# Patient Record
Sex: Female | Born: 1960
Health system: Southern US, Community
[De-identification: ages and names within clinical notes are randomized; demographics above are authoritative.]

## PROBLEM LIST (undated history)

## (undated) DIAGNOSIS — G4700 Insomnia, unspecified: Secondary | ICD-10-CM

## (undated) DIAGNOSIS — B009 Herpesviral infection, unspecified: Secondary | ICD-10-CM

## (undated) DIAGNOSIS — T7840XA Allergy, unspecified, initial encounter: Secondary | ICD-10-CM

## (undated) DIAGNOSIS — K589 Irritable bowel syndrome without diarrhea: Secondary | ICD-10-CM

## (undated) DIAGNOSIS — N390 Urinary tract infection, site not specified: Secondary | ICD-10-CM

## (undated) DIAGNOSIS — M1990 Unspecified osteoarthritis, unspecified site: Secondary | ICD-10-CM

## (undated) DIAGNOSIS — K219 Gastro-esophageal reflux disease without esophagitis: Secondary | ICD-10-CM

## (undated) DIAGNOSIS — I341 Nonrheumatic mitral (valve) prolapse: Secondary | ICD-10-CM

## (undated) DIAGNOSIS — J45909 Unspecified asthma, uncomplicated: Secondary | ICD-10-CM

## (undated) HISTORY — DX: Herpesviral infection, unspecified: B00.9

## (undated) HISTORY — PX: PELVIC LAPAROSCOPY: SHX162

## (undated) HISTORY — DX: Gastro-esophageal reflux disease without esophagitis: K21.9

## (undated) HISTORY — DX: Insomnia, unspecified: G47.00

## (undated) HISTORY — PX: URETHRAL STRICTURE DILATATION: SHX477

## (undated) HISTORY — DX: Unspecified osteoarthritis, unspecified site: M19.90

## (undated) HISTORY — DX: Unspecified asthma, uncomplicated: J45.909

## (undated) HISTORY — PX: NASAL SINUS SURGERY: SHX719

## (undated) HISTORY — DX: Irritable bowel syndrome, unspecified: K58.9

## (undated) HISTORY — PX: BLADDER REPAIR: SHX76

## (undated) HISTORY — DX: Nonrheumatic mitral (valve) prolapse: I34.1

## (undated) HISTORY — DX: Allergy, unspecified, initial encounter: T78.40XA

---
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
## 1998-12-18 ENCOUNTER — Encounter: Payer: Self-pay | Admitting: Family Medicine

## 1998-12-18 ENCOUNTER — Encounter: Admission: RE | Admit: 1998-12-18 | Discharge: 1998-12-18 | Payer: Self-pay | Admitting: Family Medicine

## 1998-12-19 ENCOUNTER — Ambulatory Visit (HOSPITAL_COMMUNITY): Admission: RE | Admit: 1998-12-19 | Discharge: 1998-12-19 | Payer: Self-pay | Admitting: Internal Medicine

## 1998-12-19 ENCOUNTER — Encounter: Payer: Self-pay | Admitting: Internal Medicine

## 1998-12-31 ENCOUNTER — Ambulatory Visit (HOSPITAL_COMMUNITY): Admission: RE | Admit: 1998-12-31 | Discharge: 1998-12-31 | Payer: Self-pay | Admitting: *Deleted

## 1999-04-12 ENCOUNTER — Other Ambulatory Visit: Admission: RE | Admit: 1999-04-12 | Discharge: 1999-04-12 | Payer: Self-pay | Admitting: Gynecology

## 2000-02-14 ENCOUNTER — Ambulatory Visit (HOSPITAL_COMMUNITY): Admission: RE | Admit: 2000-02-14 | Discharge: 2000-02-14 | Payer: Self-pay | Admitting: Gynecology

## 2000-10-21 ENCOUNTER — Other Ambulatory Visit: Admission: RE | Admit: 2000-10-21 | Discharge: 2000-10-21 | Payer: Self-pay | Admitting: Gynecology

## 2002-01-24 ENCOUNTER — Other Ambulatory Visit: Admission: RE | Admit: 2002-01-24 | Discharge: 2002-01-24 | Payer: Self-pay | Admitting: Gynecology

## 2002-06-07 ENCOUNTER — Ambulatory Visit (HOSPITAL_COMMUNITY): Admission: RE | Admit: 2002-06-07 | Discharge: 2002-06-07 | Payer: Self-pay | Admitting: Urology

## 2002-06-07 ENCOUNTER — Encounter: Payer: Self-pay | Admitting: Urology

## 2003-02-21 ENCOUNTER — Other Ambulatory Visit: Admission: RE | Admit: 2003-02-21 | Discharge: 2003-02-21 | Payer: Self-pay | Admitting: Gynecology

## 2003-03-31 ENCOUNTER — Ambulatory Visit (HOSPITAL_COMMUNITY): Admission: RE | Admit: 2003-03-31 | Discharge: 2003-03-31 | Payer: Self-pay | Admitting: Gynecology

## 2004-04-23 ENCOUNTER — Other Ambulatory Visit: Admission: RE | Admit: 2004-04-23 | Discharge: 2004-04-23 | Payer: Self-pay | Admitting: Gynecology

## 2005-06-09 ENCOUNTER — Other Ambulatory Visit: Admission: RE | Admit: 2005-06-09 | Discharge: 2005-06-09 | Payer: Self-pay | Admitting: Gynecology

## 2006-03-06 ENCOUNTER — Ambulatory Visit: Payer: Self-pay | Admitting: Family Medicine

## 2006-05-12 ENCOUNTER — Ambulatory Visit: Payer: Self-pay | Admitting: Vascular Surgery

## 2006-05-12 ENCOUNTER — Ambulatory Visit (HOSPITAL_COMMUNITY): Admission: RE | Admit: 2006-05-12 | Discharge: 2006-05-12 | Payer: Self-pay | Admitting: Family Medicine

## 2006-05-12 ENCOUNTER — Encounter (INDEPENDENT_AMBULATORY_CARE_PROVIDER_SITE_OTHER): Payer: Self-pay | Admitting: *Deleted

## 2006-09-04 ENCOUNTER — Other Ambulatory Visit: Admission: RE | Admit: 2006-09-04 | Discharge: 2006-09-04 | Payer: Self-pay | Admitting: Gynecology

## 2007-02-01 ENCOUNTER — Encounter: Admission: RE | Admit: 2007-02-01 | Discharge: 2007-02-01 | Payer: Self-pay | Admitting: Rheumatology

## 2009-04-03 ENCOUNTER — Other Ambulatory Visit: Admission: RE | Admit: 2009-04-03 | Discharge: 2009-04-03 | Payer: Self-pay | Admitting: Gynecology

## 2009-04-03 ENCOUNTER — Ambulatory Visit: Payer: Self-pay | Admitting: Gynecology

## 2009-09-14 ENCOUNTER — Emergency Department (HOSPITAL_BASED_OUTPATIENT_CLINIC_OR_DEPARTMENT_OTHER): Admission: EM | Admit: 2009-09-14 | Discharge: 2009-09-14 | Payer: Self-pay | Admitting: Emergency Medicine

## 2009-09-20 ENCOUNTER — Encounter: Admission: RE | Admit: 2009-09-20 | Discharge: 2009-09-20 | Payer: Self-pay | Admitting: Internal Medicine

## 2010-02-18 ENCOUNTER — Encounter: Payer: Self-pay | Admitting: Urology

## 2010-03-06 ENCOUNTER — Emergency Department (HOSPITAL_COMMUNITY)
Admission: EM | Admit: 2010-03-06 | Discharge: 2010-03-06 | Disposition: A | Discharge status: Home / Self Care | Payer: Self-pay | Attending: Emergency Medicine | Admitting: Emergency Medicine

## 2010-03-06 ENCOUNTER — Emergency Department (HOSPITAL_COMMUNITY): Payer: Self-pay

## 2010-03-06 DIAGNOSIS — K5641 Fecal impaction: Secondary | ICD-10-CM | POA: Insufficient documentation

## 2010-03-06 DIAGNOSIS — K6289 Other specified diseases of anus and rectum: Secondary | ICD-10-CM | POA: Insufficient documentation

## 2010-03-08 ENCOUNTER — Emergency Department (HOSPITAL_BASED_OUTPATIENT_CLINIC_OR_DEPARTMENT_OTHER)
Admission: EM | Admit: 2010-03-08 | Discharge: 2010-03-08 | Disposition: A | Discharge status: Home / Self Care | Payer: Self-pay | Attending: Emergency Medicine | Admitting: Emergency Medicine

## 2010-03-08 DIAGNOSIS — K589 Irritable bowel syndrome without diarrhea: Secondary | ICD-10-CM | POA: Insufficient documentation

## 2010-03-08 DIAGNOSIS — E860 Dehydration: Secondary | ICD-10-CM | POA: Insufficient documentation

## 2010-03-08 DIAGNOSIS — R197 Diarrhea, unspecified: Secondary | ICD-10-CM | POA: Insufficient documentation

## 2010-04-12 LAB — CBC
MCH: 31 pg (ref 26.0–34.0)
MCV: 89 fL (ref 78.0–100.0)
Platelets: 212 10*3/uL (ref 150–400)
RDW: 11.9 % (ref 11.5–15.5)

## 2010-04-12 LAB — BASIC METABOLIC PANEL
BUN: 12 mg/dL (ref 6–23)
CO2: 25 mEq/L (ref 19–32)
Chloride: 108 mEq/L (ref 96–112)
Creatinine, Ser: 1 mg/dL (ref 0.4–1.2)

## 2010-04-12 LAB — URINALYSIS, ROUTINE W REFLEX MICROSCOPIC
Bilirubin Urine: NEGATIVE
Ketones, ur: NEGATIVE mg/dL
Nitrite: NEGATIVE
pH: 6 (ref 5.0–8.0)

## 2010-04-12 LAB — PREGNANCY, URINE: Preg Test, Ur: NEGATIVE

## 2010-04-12 LAB — URINE MICROSCOPIC-ADD ON

## 2010-04-12 LAB — DIFFERENTIAL
Eosinophils Absolute: 0.1 10*3/uL (ref 0.0–0.7)
Eosinophils Relative: 2 % (ref 0–5)
Lymphs Abs: 2.9 10*3/uL (ref 0.7–4.0)

## 2010-06-14 NOTE — Assessment & Plan Note (Signed)
[name] is a 50 year old white female who was seen in our pain  and rehabilitation clinic for multiple pain complaints, including  cervicalgia, low back pain, bilateral upper extremity pain and right  lower extremity pain.   She has a history of degenerative spinal disease, including her cervical  spine, which is status post C5-6 and C6-7 fusion.  She also has mild  lumbar stenosis and lumbar spondylosis, with a long history of  fibromyalgia overlay.   She is back in today and states she has been more active over the last  several weeks.  Has been engaged in some yard activity and subsequently  has developed some increased low back pain over about the last four to  five weeks.   She states her average pain is about a 9 on the scale of 10.  She is  getting very little relief with the current meds that she is taking.  Pain is described as constant and sharp and aching.   She is able to walk about 10 minutes at a time.  She is able to climb  stairs and drive.  She is independent with all of her self care and some  high level household duties.   She denies problems controlling bowel or bladder.  Denies suicidal  ideation.  Admits to some intermittent numbness and tingling.   REVIEW OF SYSTEMS:  Positive for diarrhea, constipation, abdominal pain.  Has a history of irritable bowel syndrome.   PAST MEDICAL HISTORY:  Remarkable for recent application of braces to  both upper and lower teeth.  She has been miserable since these have  been placed.  Her teeth are achy.  She reports that she has not been  eating well.   She has no other changes in her social or family history.   MEDICATIONS:  Medications prescribed by this clinic include the  following:  1. Ibuprofen 600 mg up to three times a day on a p.r.n. basis.  2. She also takes Nexium 40 mg p.o. q.d.  3. Amitriptyline 10 mg one to two p.o. at night.   PHYSICAL EXAMINATION:  GENERAL:  She is sunburned.  Her face is  quite  red.  It is not peeling or blistered, however.  She is a well-developed,  well-nourished female who does not appear in any distress.  She is  oriented times three.  Her affect is bright, alert, cooperative and  pleasant.  She follows demands without difficulty.  VITAL SIGNS:  Blood pressure 105/51, pulse 84, respirations 16, 100%  saturated on room air.  MUSCULOSKELETAL:  She transitions from sit to stand easily.  Gait in the  room is stable.  Tandem gait, Romberg test is performed adequately.  She  has limitations of lumbar motion in all planes.   Reflexes are symmetric and intact in the lower extremities.  Motor  strength is good in both lower extremities.  She has quite a bit of  tenderness even with light palpation throughout the lumbar paraspinal  musculature.  Both trochanters are quite tender down the iliotibial band  bilaterally.   IMPRESSION:  1. Early central lateral lumbar stenosis.  2. Bilateral trochanteric bursitis.  3. Cervicogenic headache.  4. Cervicalgia status post C5-6 and C6-7 fusion by Dr. Lucita Ferrara.   Cervical radiofrequency was done last March 11, 2005.   PLAN:  She brings up trialing Lyrica again.  Apparently she has a friend  who has done well on it; however, while she was on it we  had some  concerns about easy bleeding.  PT and PTT were tested, however,  apparently the sample was not kept frozen and the test results may have  been compromised.   Will refill her medications today, ibuprofen 600 mg up to twice a day  number 60 for the month, and amitriptyline 10 mg 1 to 2 p.o. q.h.s.  number 60, no refills.  Would like to try her on Ultracet as well 1 to 2  p.o. b.i.d. number 120 and consider trochanteric injection again at the  next visit.  We will see how she is doing.  I have asked her to continue  her exercise program again.  She has been to therapy a little over a  year ago for her trochanteric bursitis of the iliotibial band syndrome;   however, she has not been following through with the program.  Will see  her back in a month.           ______________________________  Brantley Stage, M.D.     DMK/MedQ  D:  05/28/2006 13:31:43  T:  05/28/2006 14:18:55  Job #:  [id number]

## 2010-06-14 NOTE — H&P (Signed)
[facility]  Patient:    Tracy Larsen, Tracy Larsen                        MRN: [id number] Attending:  Douglass Rivers, M.D.                         History and Physical  CHIEF COMPLAINT:              History of endometriosis with left lower quadrant pain and endocervical polyps.  HISTORY OF PRESENT ILLNESS:   The patient is a 50 year old G1, P1 with a history of endometriosis -- she has had four laparoscopies in the past with fulguration of endometriosis -- who has indolent right lower quadrant pain that has increased over the past number of years.  She is desiring one more pregnancy and would like evaluation of the left lower quadrant pain.  Of note, she has a history of kidney stones and irritable bowel as well but this has been more persistent and different than the previous.  She also has a history of endocervical polyps, with postcoital bleeding as well as spotting on birth control pills.  She has had intra-office endocervical polypectomy in 1998 and had initially done very well, but the polyps have returned and she continues to have symptoms at this point and would like more definitive treatment of them.  PAST OBSTETRICAL/GYNECOLOGICAL HISTORY:  Significant for endometriosis, as above, regular menses, contracepting with birth control pills, normal Pap smears, history of HSV, for which she takes Zovirax as needed.  PAST MEDICAL HISTORY:         Significant for kidney stones and urethral stenosis for which she has had dilation, last one in 1979.  PAST SURGICAL HISTORY:        Significant for urethral dilation in 1979, laparoscopies x 4 and nose reconstruction in 1992.  MEDICATIONS:                  Birth control pills and Zovirax p.r.n.  ALLERGIES:                    TETRACYCLINE, SULFA, QUESTIONABLY IODINE.  PHYSICAL EXAMINATION  GENERAL:                      She is well-appearing, in no acute distress.  HEENT:                         Unremarkable.  NECK:                         Thyroid is smooth, nontender and mobile.  HEART:                        Regular rate.  LUNGS:                        Clear to auscultation.  BREASTS:                      Without mass, discharge or retractions in supine and upright position.  ABDOMEN:                      Soft and nontender.  PELVIC:  On GYN exam, normal external female genitalia. BUS negative.  The vagina is pink and moist.  Cervix:  There is a clustering of endocervical polyps that is visualized.  There is no cervical motion tenderness.  There is some tenderness in the right lower quadrant; the left is negative.  There is no fullness.  Rectovaginal exam is confirmatory.  ULTRASOUND:                   Ultrasound, January 29, 2000, was normal without any ovarian cysts in either adnexa.  ASSESSMENT:                   1. Right lower quadrant pain with a history of                                  endometriosis, status post fulguration x 4,                                  for diagnostic laparoscopy, possible                                  fulguration of endometriosis or lysis of                                  adhesions.                               2. Endocervical polyps with postcoital bleeding.                                  We will do a hysteroscopy in an attempt to                                  resect the polyps at their base, as we were                                  unsuccessful in the past.                                The risks and benefits of both procedures were both discussed with the patient.  Because of her previous laparoscopies in the past, we will do an open laparoscopy and she is agreeable.  All questions were addressed and she will present on January 18th for the above procedures. DD:  02/14/00 TD:  02/14/00 Job: [id number]

## 2010-06-14 NOTE — Op Note (Signed)
[facility]  Patient:    Tracy Larsen, Tracy Larsen                      MRN: [id number] Proc. Date: 02/14/00 Adm. Date:  [id number] Disc. Date: [id number] Attending:  Douglass Rivers                           Operative Report  PREOPERATIVE DIAGNOSES:       1. Pelvic pain.                               2. History of endometriosis.                               3. Endocervical polyps.                               4. Postcoital bleeding.  POSTOPERATIVE DIAGNOSES:      1. Pelvic pain.                               2. History of endometriosis.                               3. Endocervical polyps.                               4. Postcoital bleeding.                               5. Endometriosis.  PROCEDURE:                    1. Diagnostic hysteroscopy with polypectomy.                               2. Open laparoscopy.                               3. Fulguration of endometriosis with YAG.  SURGEON:                      Douglass Rivers, M.D.  ANESTHESIA:                   General.  ESTIMATED BLOOD LOSS:         Minimal.  IN AND OUT DEFICITS:          40 cc of a 3% Sorbitol solution.  FINDINGS:                     Normal uterine cavity contours. Superficial endocervical polyps. There is endometriosis studding throughout the anterior and posterior cul-de-sac. Normal tubes, ovaries, liver, gallbladder, and appendix.  COMPLICATIONS:                None.  PATHOLOGY:                    Endocervical polyps.  DESCRIPTION OF PROCEDURE:  Patient was taken to the operating room, general anesthesia was induced, and prepped and draped in usual sterile fashion. Laminaria placed the night before was removed. A sterile weighted speculum was placed in the vagina. The cervix was visualized and stabilized with a single-tooth tenaculum. Ten cc of a 2% Pitressin solution was injected into the cervix for hemostasis. The cervix was then gently dilated up to 31 [place] and  the operative hysteroscope was advanced through the cervix. The uterus was  unremarkable. The contour were normal. The ostia were visualized and grossly normal. The hysteroscope was slowly removed from the cavity and only at the very edge of the cervix were the origins of the polyps able to be visualized. Unfortunately, because this was at the very edge of the cervix, we were unable to adequately dilate the cervix to use the hysteroscope. The hysteroscope was then removed from the vagina. A gentle curettage of the cervix was performed as well as using polyp forceps and three rather large polyps were removed. There was then a gentle scraping of the remainder of the canal which was essentially unremarkable. An acorn was then placed in the cervix and attention was then turned to the abdomen. As the patient had had four prior laparoscopes, an infraumbilical incision was made with the scalpel and carried through sharply until the underlying fascia was visualized. The fascia was elevated and sharply entered. The peritoneum was identified and entered bluntly with a Tresa Endo. Upon entering the cavity, we were noted to be free of bowel adhesions. The incision was then extended and a #10 trocar was inserted through the port. The open laparoscopy fascial defect was slightly larger than the laparoscope port and the skin was reapproximated in order to get a better seal with the Allis clamp. A pneumoperitoneum was created. The findings were as above. A #5 port was made in the midline through previous incision through which a blunt probe was used to move the bowel out of the pelvis. Suction irrigator was the presented through this port. A YAG laser was then inserted through the operative scope with KPR6 sapphire tip with 10 watts. The anterior cul-de-sac endometriosis was treated in a sweeping fashion. It was noted to be hemostatic. All areas of endometriosis in the anterior cul-de-sac were treated and then  irrigated, noted to be hemostatic. Attention was then turned to the posterior cul-de-sac. The bowel was moved away and the posterior cul-de-sac was treated with the laser in a spot and sweeping fashion. Most of the disease was on the uterosacral ligaments bilaterally and between them. There was also a small amount in the cul-de-sac inferior to the uterosacrals and this was also treated. There was a small amount of endometriosis that was over the ureter on the left. This was left; however, endometriosis anteriorly and inferiorly to this was superficially treated. The area underneath both ovaries was noted to be free of disease as was both ovaries. The tubes were noted to be lush. There was a small amount of endometriosis on the posterior uterus. Peristalsis was appreciated on the right ureter but not the left. The appendix was visualized and noted to be hemostatic. The pelvis was irrigated with copious amount of saline. The laser was passed off the table. The pneumoperitoneum was released and the ports were removed under direct visualization. The fascia and peritoneum of the infraumbilical port was closed with a figure-of-eight of 0 Vicryl. The skin was closed with 3-0 plain. The areas of the Niederwald clamps as well as the  infraumbilical port was injected with 0.25% Marcaine solution. The suprapubic area, as we had gone through a thick keloid scar, was reapproximated with 3-0 plain and again was also injected with 0.25% Marcaine. The instruments were then removed from the vagina. The vagina was inspected. The cervix was noted to be hemostatic. The patient tolerated the procedure well. Sponge, lap, and needle counts correct x 2. She was transferred to the recovery room in stable condition. DD:  02/14/00 TD:  02/15/00 Job: [id number]

## 2010-07-23 ENCOUNTER — Encounter (INDEPENDENT_AMBULATORY_CARE_PROVIDER_SITE_OTHER): Payer: BC Managed Care – PPO | Admitting: Gynecology

## 2010-07-23 ENCOUNTER — Other Ambulatory Visit: Payer: Self-pay | Admitting: Gynecology

## 2010-07-23 ENCOUNTER — Other Ambulatory Visit (HOSPITAL_COMMUNITY)
Admission: RE | Admit: 2010-07-23 | Discharge: 2010-07-23 | Disposition: A | Discharge status: Home / Self Care | Payer: BC Managed Care – PPO | Source: Ambulatory Visit | Attending: Gynecology | Admitting: Gynecology

## 2010-07-23 DIAGNOSIS — Z01419 Encounter for gynecological examination (general) (routine) without abnormal findings: Secondary | ICD-10-CM

## 2010-07-23 DIAGNOSIS — Z124 Encounter for screening for malignant neoplasm of cervix: Secondary | ICD-10-CM | POA: Insufficient documentation

## 2010-08-19 ENCOUNTER — Telehealth: Payer: Self-pay | Admitting: *Deleted

## 2010-08-19 NOTE — Telephone Encounter (Signed)
PT LM ABOUT QUESTIONS RE: BCP AND PAP RESULT FOR OV 07/23/10.CALLED PT BACK ABOUT QUESTION REGARDING BCP. LM ON PT V.M PER PT REQUEST WITH ANSWER TO QUESTIONS.

## 2010-08-27 ENCOUNTER — Telehealth: Payer: Self-pay | Admitting: *Deleted

## 2010-08-27 MED ORDER — NORETHINDRONE ACET-ETHINYL EST 1-20 MG-MCG PO TABS: 1.0000 | ORAL_TABLET | Freq: Every day | ORAL | Status: DC

## 2010-08-27 NOTE — Telephone Encounter (Signed)
FYI PT WANTED YOU TO KNOW SHE IS DOING WELL ON LO LOESTRIN BCP AND HAS COMPLETED HER SAMPLES. SHE WOULD LIKE RX FOR BCP.

## 2010-08-27 NOTE — Telephone Encounter (Signed)
PT INFORMED WITH THE BELOW NOTE. 

## 2010-08-27 NOTE — Telephone Encounter (Signed)
If she is doing well on the 120 equivalent dose then we can go and refill her until June 2013. Assuming she continues well this was down she has any issues then office visit.  Her FSH was elevated consistent with menopause at some point we'll need to stop the pills and discuss whether we want to switch over to a lower dose HRT regimen but I think at this point since she is doing well on these we'll keep her on this she is comfortable with this.

## 2010-09-04 ENCOUNTER — Telehealth: Payer: Self-pay | Admitting: *Deleted

## 2010-09-04 NOTE — Telephone Encounter (Signed)
PT CALLED STATING THAT SHE PICKED UP HER BCP RX FOR LOESTRIN 1/20 AND ONLY HAD THREE ROWS OF ACTIVE PILLS RATHER THAN 4 ROWS OF THE SAMPLES SHE WAS GIVEN IN THE OFFICE WHICH WERE LO LOESTRIN FE. PT IS VERY CONCERNED THAT SHE WON'T RECEIVE THE SAME AMOUNT OF HORMONE THAT SHE GOT IN HER SAMPLE PACK. PT IS AWARE THAT SHE WAS GIVEN GENERIC BRAND. PLEASE ADVISE.

## 2010-09-04 NOTE — Telephone Encounter (Signed)
Lo Loestrin has a little bit lower estrogen dose then Loestrin 1/20.  I prescribed the Loestrin 120 because it's generic and usually cheaper. From a contraceptive standpoint I think both are okay. The Loestrin 1/20 has 3 weeks of active pills and a pill free week. The lo Loestrin extents the hormones further into the fourth week. Theoretically the low Loestrin have a lower thrombosis blood clot risk, but I am not sure at these lower levels whether that would be clinically significant in her case.  Options would be to call in the lower Loestrin if you feel more comfortable with continuing the same or she can try the Loestrin 120 see how she does the first month and then go from there. If he does well with it stay on it if not then call us and we can switch her back.

## 2010-09-04 NOTE — Telephone Encounter (Signed)
PT INFORMED WITH THE BELOW NOTE. 

## 2010-10-11 ENCOUNTER — Telehealth: Payer: Self-pay | Admitting: *Deleted

## 2010-10-11 NOTE — Telephone Encounter (Signed)
Pt called c/o rash under breasts: red, raised and itching. Requests some yeast/hydrocortisone cream to be called in. Ok per [place] to send to her pharmacy, Rx called in with one refill.

## 2010-11-20 ENCOUNTER — Telehealth: Payer: Self-pay | Admitting: *Deleted

## 2010-11-20 MED ORDER — NORGESTREL-ETHINYL ESTRADIOL 0.3-30 MG-MCG PO TABS: 1.0000 | ORAL_TABLET | Freq: Every day | ORAL | Status: DC

## 2010-11-20 NOTE — Telephone Encounter (Signed)
Pt called complaining of increased weight gain for her loestrin 1/20 birth control. She says that she feels very bloating all the time and feels miserable. She wants to know if she could switch to another pill? Please advise the above.

## 2010-11-20 NOTE — Telephone Encounter (Signed)
Left message on pt voicemail with the below note 

## 2010-11-20 NOTE — Telephone Encounter (Signed)
We can try a LoOvral equivalent.

## 2011-03-11 ENCOUNTER — Encounter: Payer: Self-pay | Admitting: Gynecology

## 2011-03-11 ENCOUNTER — Ambulatory Visit (INDEPENDENT_AMBULATORY_CARE_PROVIDER_SITE_OTHER): Payer: 59 | Admitting: Gynecology

## 2011-03-11 DIAGNOSIS — R21 Rash and other nonspecific skin eruption: Secondary | ICD-10-CM

## 2011-03-11 MED ORDER — FLUCONAZOLE 200 MG PO TABS: 200.0000 mg | ORAL_TABLET | Freq: Every day | ORAL | Status: AC

## 2011-03-11 MED ORDER — ACYCLOVIR 400 MG PO TABS: 400.0000 mg | ORAL_TABLET | Freq: Two times a day (BID) | ORAL | Status: DC

## 2011-03-11 MED ORDER — HYDROXYZINE HCL 25 MG PO TABS: 25.0000 mg | ORAL_TABLET | Freq: Three times a day (TID) | ORAL | Status: AC | PRN

## 2011-03-11 NOTE — Patient Instructions (Signed)
Take medications as prescribed. Follow up with dermatology. Follow up with your decision as far as oral contraceptives or hormone replacement therapy.

## 2011-03-11 NOTE — Progress Notes (Signed)
Patient presents with a several week history of chest and back rash. She had the initial onset of hives which substantively followed with a red papular type rash. She was seen in urgent care was given a steroid dose pack which she has 2 days left and steroid cream is getting better. She has an appointment to see a dermatologist next month which was the soonest she could be seen. She had been on oral contraceptives but stopped them 2 weeks ago at the onset of the rash thinking maybe it was related although she has been on these birth control pills for a number of months. We evaluated her in June complaining of menopausal type symptoms and was found to have an elevated FSH at 65. She elected to continue on the oral contraceptives but notes she was getting hot flashes during the pill free week.  Patient also notes a longer history of a rash underneath her breasts and and in between her breasts and had been putting on nystatin cream on this.  Exam with papular punctated rash over her chest and lower abdomen. Some erythema between her breasts and underneath her breast although not classic for yeast.  Assessment and plan: Hives followed by rash. Seems to be getting better. I added Vistaril 25 mg at bedtime to help with her itching #30. I also provided Diflucan 200 mg #7 to be taken daily in the event that the rash between her breasts and underneath is related to Candida. She just started her period today having a lot of hot flashes and issues as to where he goes for his as concern was discussed. She could restart her oral contraceptives and supplement estrogen her pill free week as she does have a lot of hot flashes than. I reviewed the risks of birth control pills and a prior smoker at her age and the risk of stroke heart attack DVT reviewed. Alternatives would be to start HRT which I think would ask her to be a more appropriate choice than she would need to use backup contraception at this point until the picture is  clear that she is amenorrheic. She is very reluctant on these contraception as she has had multiple allergic type reactions when she tried the NuvaRing condoms and spermicides. The issue of overkill with the oral contraceptives given her elevated FSH in symptomatology versus not being truly through menopause and the risk of pregnancy again were discussed. I reviewed the risks of HRT to include the WHI increased risk of stroke heart attack DVT and breast cancer. The patient wants to think of her options and she will call back if she wants to restart low-dose pills accepted the risks or HRT.  Nonhormonal options such as Effexor were also possible although would not provide contraception and that would also be an issue until again the picture is clear as far as menstruation. I do not think repeating FSH at this point is of any benefit given her elevated 66 in June 2012 and her current symptomatology.

## 2011-03-13 ENCOUNTER — Telehealth: Payer: Self-pay | Admitting: *Deleted

## 2011-03-13 MED ORDER — ESTRADIOL-NORETHINDRONE ACET 0.5-0.1 MG PO TABS: 1.0000 | ORAL_TABLET | Freq: Every day | ORAL | Status: DC

## 2011-03-13 NOTE — Telephone Encounter (Signed)
She can start right into the HRT and I am going to put her on Activella which she will take every day. I want her to keep a bleeding calendar and let me know if she has any periods after doing this and reminded her to use backup contraception for now.  Call me in several must limit know how she's doing.

## 2011-03-13 NOTE — Telephone Encounter (Signed)
Informed.

## 2011-03-13 NOTE — Telephone Encounter (Signed)
Pt wants to go off OCP and go onto HRT. Can she just start taking the HRT when she gets it or does she need to start at a particular time? pls advise

## 2011-04-09 ENCOUNTER — Encounter: Payer: Self-pay | Admitting: Family Medicine

## 2011-04-09 ENCOUNTER — Ambulatory Visit (INDEPENDENT_AMBULATORY_CARE_PROVIDER_SITE_OTHER): Payer: 59 | Admitting: Family Medicine

## 2011-04-09 VITALS — BP 110/78 | HR 83 | Temp 98.0°F | Resp 16 | Ht 67.0 in | Wt 197.4 lb

## 2011-04-09 DIAGNOSIS — R21 Rash and other nonspecific skin eruption: Secondary | ICD-10-CM

## 2011-04-09 DIAGNOSIS — R5383 Other fatigue: Secondary | ICD-10-CM

## 2011-04-09 DIAGNOSIS — J029 Acute pharyngitis, unspecified: Secondary | ICD-10-CM

## 2011-04-09 DIAGNOSIS — R5381 Other malaise: Secondary | ICD-10-CM

## 2011-04-09 LAB — POCT CBC
HCT, POC: 44.3 % (ref 37.7–47.9)
Lymph, poc: 2.2 (ref 0.6–3.4)
MCH, POC: 28.7 pg (ref 27–31.2)
MCHC: 32.5 g/dL (ref 31.8–35.4)
MCV: 88.3 fL (ref 80–97)
POC Granulocyte: 4.1 (ref 2–6.9)
POC LYMPH PERCENT: 31.6 %L (ref 10–50)
RDW, POC: 12.9 %
WBC: 7 10*3/uL (ref 4.6–10.2)

## 2011-04-09 MED ORDER — METHYLPREDNISOLONE 4 MG PO KIT: PACK | ORAL | Status: AC

## 2011-04-09 NOTE — Progress Notes (Signed)
Patient Name: Tracy Larsen Date of Birth: 1960-08-12 Medical Record Number: [id number] Gender: female Date of Encounter: 04/09/2011  History of Present Illness:  Tracy Larsen is a 51 y.o. very pleasant female patient who presents with the following:  Felt nauseated on Saturday- today is Wednesday.  Then she developed a severe ST and headaches which caused her to need to lie down.  HA is better, ST is a little better now.  Yesterday she developed a rash across her face- started by her left ear and has spread to both sides of her forehead.  Is very itchy but not painful.  She has no eye or visual symptoms, does not wear contacts  About one month ago had an episode of hives which responded well to a medrol dosepack.  She is not sure the cause of her hives.  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   There is no problem list on file for this patient.  Past Medical History  Diagnosis Date  . Herpes   . Endometriosis   . Mitral valve prolapse   . IBS (irritable bowel syndrome)   . GERD (gastroesophageal reflux disease)   . IBS (irritable bowel syndrome)   . HSV (herpes simplex virus) infection   . Insomnia   . Endometriosis    Past Surgical History  Procedure Date  . Bladder repair     AGE 33  . Pelvic laparoscopy     X 5  . Urethral stricture dilatation    History  Substance Use Topics  . Smoking status: Former Games developer  . Smokeless tobacco: Not on file  . Alcohol Use: Yes   Family History  Problem Relation Age of Onset  . Cancer Mother     VULVAR CANCER  . Heart disease Father   . Cancer Brother     SARCOMA  . Heart disease Brother     ALSO GRANDFATHER W CA NOT SPECIFIED IF PAT OR MAT  . Diabetes Paternal Grandmother    Allergies  Allergen  Reactions  . Avelox (Moxifloxacin Hcl In Nacl)   . Entex   . Erythromycin   . Levaquin   . Sulfa Antibiotics   . Tetracyclines & Related     Medication list has been reviewed and updated.  Review of Systems: As per HPI- otherwise negative.   Physical Examination: Filed Vitals:   04/09/11 1359  BP: 110/78  Pulse: 83  Temp: 98 F (36.7 C)  TempSrc: Oral  Resp: 16  Height: 5\' 7"  (1.702 m)  Weight: 197 lb 6.4 oz (89.54 kg)    Body mass index is 30.92 kg/(m^2).  GEN: WDWN, NAD, Non-toxic, A & O x 3, overweight HEENT: Atraumatic, Normocephalic. Neck supple. No masses, No LAD.  TM and oropharynx wnl.  Eyes normal, PEERL, EOMI, conjunctivae clear Face: there is a discrete papular rash which is most intense and also scaly anterior to her right ear.  It continues in a more diffuse manner across her left forehead and onto her right forehead.  There is no other rash present Ears and Nose: No external deformity. CV: RRR, No M/G/R. No JVD. No thrill. No extra heart sounds. PULM: CTA B, no wheezes, crackles, rhonchi. No retractions. No resp. distress.  No accessory muscle use. EXTR: No c/c/e NEURO Normal gait.  PSYCH: Normally interactive. Conversant. Not depressed or anxious appearing.  Calm demeanor.   Results for orders placed in visit on 04/09/11  POCT CBC      Component Value Range   WBC 7.0  4.6 - 10.2 (K/uL)   Lymph, poc 2.2  0.6 - 3.4    POC LYMPH PERCENT 31.6  10 - 50 (%L)   MID (cbc) 0.7  0 - 0.9    POC MID % 9.8  0 - 12 (%M)   POC Granulocyte 4.1  2 - 6.9    Granulocyte percent 58.6  37 - 80 (%G)   RBC 5.02  4.04 - 5.48 (M/uL)   Hemoglobin 14.4  12.2 - 16.2 (g/dL)   HCT, POC 16.1  09.6 - 47.9 (%)   MCV 88.3  80 - 97 (fL)   MCH, POC 28.7  27 - 31.2 (pg)   MCHC 32.5  31.8 - 35.4 (g/dL)   RDW, POC 04.5     Platelet Count, POC 286  142 - 424 (K/uL)   MPV 8.1  0 - 99.8 (fL)  POCT INFLUENZA A/B      Component Value Range   Influenza A, POC Negative     Influenza B,  POC Negative    POCT RAPID STREP A (OFFICE)      Component Value Range   Rapid Strep A Screen Negative  Negative      Assessment and Plan: 1. Sore throat  POCT rapid strep A, Throat culture ([facility])  2. Fatigue  POCT Influenza A/B  3. Rash  POCT CBC, methylPREDNISolone (MEDROL DOSEPAK) 4 MG tablet   Tamana likely has a viral illness causing her URI symptoms and cough.  The rash may be a part of this illness or could be a contact dermatitis of some type.  She has tried OTC cortisone to no effect, and I do not want to use stronger steroids topically if possible.  She is very concerned about the appearance of her rash and the itching is very bothersome she wishes to try a steroid pack as above; this seems reasonable as the rash is very pruritic.  However I cautioned her that if the steroids make the rash worse, stop using them and call us right away.  Patient (or parent if minor) instructed to return to clinic or call if not better in 2-3 day(s). Sooner if worse.

## 2011-04-11 LAB — CULTURE, GROUP A STREP: Organism ID, Bacteria: NORMAL

## 2011-04-12 ENCOUNTER — Encounter: Payer: Self-pay | Admitting: Family Medicine

## 2011-04-17 ENCOUNTER — Other Ambulatory Visit: Payer: Self-pay

## 2011-08-28 LAB — TSH: TSH: 1.73 u[IU]/mL (ref 0.41–5.90)

## 2011-08-28 LAB — CBC AND DIFFERENTIAL: WBC: 5.8 10^3/mL

## 2011-10-15 ENCOUNTER — Ambulatory Visit (INDEPENDENT_AMBULATORY_CARE_PROVIDER_SITE_OTHER): Payer: 59 | Admitting: Family Medicine

## 2011-10-15 ENCOUNTER — Encounter: Payer: Self-pay | Admitting: Family Medicine

## 2011-10-15 VITALS — BP 115/68 | HR 55 | Temp 98.2°F | Resp 18 | Ht <= 58 in | Wt 191.0 lb

## 2011-10-15 DIAGNOSIS — N920 Excessive and frequent menstruation with regular cycle: Secondary | ICD-10-CM

## 2011-10-15 LAB — POCT CBC
HCT, POC: 44.6 % (ref 37.7–47.9)
Lymph, poc: 2.1 (ref 0.6–3.4)
MCH, POC: 29.5 pg (ref 27–31.2)
MCV: 93.3 fL (ref 80–97)
MID (cbc): 0.4 (ref 0–0.9)
POC LYMPH PERCENT: 34.4 %L (ref 10–50)
Platelet Count, POC: 280 10*3/uL (ref 142–424)
RDW, POC: 13.6 %
WBC: 6.1 10*3/uL (ref 4.6–10.2)

## 2011-10-15 MED ORDER — LEVONORGESTREL-ETHINYL ESTRAD 0.1-20 MG-MCG PO TABS: 1.0000 | ORAL_TABLET | Freq: Every day | ORAL | Status: DC

## 2011-10-15 NOTE — Progress Notes (Signed)
[facility] [address] [place] [address] [phone]  Date:  10/15/2011   Name:  Tracy Larsen   DOB:  Apr 08, 1960   MRN:  [id number]  PCP:  Tonye Pearson, MD    Chief Complaint: Menorrhagia   History of Present Illness:  Tracy Larsen is a 51 y.o. very pleasant female patient who presents with the following:  Here today to evaluate heavy periods.  She stopped her OCP in January of this year due to concerns about weight gain and her age.  She then developed some hot flashes and saw her OB Dr. Audie Box in February. She had labs which indicated that she was peri- menopausal.   She started some HRT in February but was concerned about possible adverse effects and stopped after just a couple of weeks.    She did not bleed at all after stopping the HRT for a bout 2 months.  However, she then started to have heavy menses which are occuring twice a month.  She has been passing huge blood clots, and feels pressure in her pelvis/uterus.  She notes bright red and black blood.  For the last couple of days she has felt raw and sore in her vagina.  This could be due to frequent tampon changes.  There are no other vaginal symptoms such as discharge and no urinary symptoms.   She plans to establish care with a new OB- [facility] with DR. Horvath.  Her daughter will be delivering her first grandchild with Dr. Henderson Cloud this spring- she met Dr. Henderson Cloud with her daughter and wanted to start seeing her.    She has a history of endometriosis, which has been treated in the past with a laser procedure and which has been pretty quiet as of late.      She has a history of uterine fibroids and has had some ?removed in her OB office in the past, and she also had some treated during a pelvic laparoscopy for endometriosis in the year 2000.   She is employed at [employer]- they recently checked a TSH for her, and her Hg last month was in the 13s. Her Vit D was slightly  low ans she has started an OTC supplement  There is no problem list on file for this patient.   Past Medical History  Diagnosis Date  . Herpes   . Endometriosis   . Mitral valve prolapse   . IBS (irritable bowel syndrome)   . GERD (gastroesophageal reflux disease)   . IBS (irritable bowel syndrome)   . HSV (herpes simplex virus) infection   . Insomnia   . Endometriosis     Past Surgical History  Procedure Date  . Bladder repair     AGE 71  . Pelvic laparoscopy     X 5  . Urethral stricture dilatation     History  Substance Use Topics  . Smoking status: Former Games developer  . Smokeless tobacco: Not on file  . Alcohol Use: Yes    Family History  Problem Relation Age of Onset  . Cancer Mother     VULVAR CANCER  . Heart disease Father   . Cancer Brother     SARCOMA  . Heart disease Brother     ALSO GRANDFATHER W CA NOT SPECIFIED IF PAT OR MAT  . Diabetes Paternal Grandmother     Allergies  Allergen Reactions  . Avelox (Moxifloxacin Hcl In Nacl)   . Entex   .  Erythromycin   . Levofloxacin   . Sulfa Antibiotics   . Tetracyclines & Related     Medication list has been reviewed and updated.  Current Outpatient Prescriptions on File Prior to Visit  Medication Sig Dispense Refill  . acyclovir (ZOVIRAX) 400 MG tablet Take 1 tablet (400 mg total) by mouth 2 (two) times daily.  30 tablet  1  . dicyclomine (BENTYL) 10 MG capsule Take 10 mg by mouth.        . Estradiol-Norethindrone Acet 0.5-0.1 MG per tablet Take 1 tablet by mouth daily.  30 tablet  3  . ibuprofen (ADVIL,MOTRIN) 200 MG tablet Take 200 mg by mouth.        . levonorgestrel-ethinyl estradiol (AVIANE,ALESSE,LESSINA) 0.1-20 MG-MCG per tablet Take 1 tablet by mouth daily.        . methylPREDNISolone (MEDROL DOSEPAK) 4 MG tablet Take 4 mg by mouth. follow package directions      . norethindrone-ethinyl estradiol (LOESTRIN 1/20, 21,) 1-20 MG-MCG tablet Take 1 tablet by mouth daily.  1 Package  11  .  norgestrel-ethinyl estradiol (LO/OVRAL) 0.3-30 MG-MCG tablet Take 1 tablet by mouth daily.  1 Package  7  . nystatin (MYCOSTATIN) cream Apply 1 application topically 2 (two) times daily.          Review of Systems:  As per HPI- otherwise negative.   Physical Examination: Filed Vitals:   10/15/11 1014  BP: 115/68  Pulse: 55  Temp: 98.2 F (36.8 C)  Resp: 18   Filed Vitals:   10/15/11 1014  Height: 4\' 9"  (1.448 m)  Weight: 191 lb (86.637 kg)   Body mass index is 41.33 kg/(m^2). Ideal Body Weight: Weight in (lb) to have BMI = 25: 115.3   GEN: WDWN, NAD, Non-toxic, A & O x 3, overweight HEENT: Atraumatic, Normocephalic. Neck supple. No masses, No LAD. Ears and Nose: No external deformity. CV: RRR, No M/G/R. No JVD. No thrill. No extra heart sounds. PULM: CTA B, no wheezes, crackles, rhonchi. No retractions. No resp. distress. No accessory muscle use. ABD: S, NT, ND, +BS. No rebound. No HSM. GU: normal external and internal exam- no lesions or discharge.  Light uterine bleeding  Uterus does not seem grossly enlarged, no adnexal masses.  No CMT EXTR: No c/c/e NEURO Normal gait.  PSYCH: Normally interactive. Conversant. Not depressed or anxious appearing.  Calm demeanor.   Results for orders placed in visit on 10/15/11  POCT CBC      Component Value Range   WBC 6.1  4.6 - 10.2 K/uL   Lymph, poc 2.1  0.6 - 3.4   POC LYMPH PERCENT 34.4  10 - 50 %L   MID (cbc) 0.4  0 - 0.9   POC MID % 6.3  0 - 12 %M   POC Granulocyte 3.6  2 - 6.9   Granulocyte percent 59.3  37 - 80 %G   RBC 4.78  4.04 - 5.48 M/uL   Hemoglobin 14.1  12.2 - 16.2 g/dL   HCT, POC 16.1  09.6 - 47.9 %   MCV 93.3  80 - 97 fL   MCH, POC 29.5  27 - 31.2 pg   MCHC 31.6 (*) 31.8 - 35.4 g/dL   RDW, POC 04.5     Platelet Count, POC 280  142 - 424 K/uL   MPV 8.8  0 - 99.8 fL  POCT URINE PREGNANCY      Component Value Range   Preg Test, Ur Negative  Assessment and Plan: 1. Menorrhagia  POCT CBC, POCT urine  pregnancy, levonorgestrel-ethinyl estradiol (AVIANE,ALESSE,LESSINA) 0.1-20 MG-MCG tablet  menorrhagia likely due to anovulation and uterine instability.    Spoke with Genella Rife, nurse for Dr. Henderson Cloud.  We will put Tracy Larsen back on he OCP for the time being to stop her bleeding.  She will follow- up with Dr. Henderson Cloud as soon as she can- they were not able to give her an earlier appt at this time, but she will call every couple of days in case there is a cancellation.  Offered to set up an ultrasound, but Tracy Larsen is very worried that her insurance will not cover it if she has a procedure done outside of a outpatient clinic.  She prefers to wait until she is seen by OB.  She will call me if the OCP is not helping to resolve her bleeding- sooner if she is worse or has any other symptoms.  Meds ordered this encounter  Medications  . levonorgestrel-ethinyl estradiol (AVIANE,ALESSE,LESSINA) 0.1-20 MG-MCG tablet    Sig: Take 1 tablet by mouth daily.    Dispense:  1 Package    Refill:  6    COPLAND,JESSICA, MD

## 2011-10-19 ENCOUNTER — Telehealth: Payer: Self-pay

## 2011-10-19 NOTE — Telephone Encounter (Signed)
PATIENT STATES SHE SAW DR. Patsy Lager ON SEPT. 18TH FOR VERY BAD BLEEDING SHE WAS HAVING. DR. Patsy Lager PUT HER ON A BIRTH CONTROL PILL, BUT IT MAKES HER RETAIN FLUID. SHE WOULD LIKE TO KNOW IF DR. COPLAND WOULD PRESCRIBE HER A FLUID PILL? THE FLUID PILLS THAT YOU GET OVER THE COUNTER DOES NOT WORK. SHE DOES WANT DR. Patsy Lager TO KNOW SHE HAS A HISTORY OF KIDNEY REFLUX SO PLEASE LET IT BE SOMETHING SHE CAN TAKE THAT IS SAFE.  BEST PHONE [phone] (CELL)  PHARMACY CHOICE IS [facility] ON [address].   MBC

## 2011-10-20 NOTE — Telephone Encounter (Signed)
Called her back and LMOM- as she just started the OCP a few days ago I would recommend giving it a couple of weeks to see if her fluid retention does not settle out.  If she is having severe swelling or other problems please give me a call.  However, I would not generally recommend using an rx diuretic without at least checking her kidney function first.  Please cal or come back if this information was not helpful to her

## 2011-11-15 ENCOUNTER — Ambulatory Visit (INDEPENDENT_AMBULATORY_CARE_PROVIDER_SITE_OTHER): Payer: 59 | Admitting: Family Medicine

## 2011-11-15 ENCOUNTER — Encounter: Payer: Self-pay | Admitting: Family Medicine

## 2011-11-15 VITALS — BP 112/68 | HR 70 | Temp 98.1°F | Resp 16 | Ht 68.0 in | Wt 194.0 lb

## 2011-11-15 DIAGNOSIS — L299 Pruritus, unspecified: Secondary | ICD-10-CM

## 2011-11-15 DIAGNOSIS — R21 Rash and other nonspecific skin eruption: Secondary | ICD-10-CM

## 2011-11-15 MED ORDER — PREDNISONE 20 MG PO TABS: ORAL_TABLET | ORAL | Status: DC

## 2011-11-15 MED ORDER — TRIAMCINOLONE ACETONIDE 0.5 % EX OINT: TOPICAL_OINTMENT | Freq: Two times a day (BID) | CUTANEOUS | Status: DC

## 2011-11-15 NOTE — Progress Notes (Signed)
[facility] [address] [place] [address] [phone]  Date:  11/15/2011   Name:  Tracy Larsen   DOB:  [birth year]   MRN:  [id number]  PCP:  Tonye Pearson, MD    Chief Complaint: Rash   History of Present Illness:  Tracy Larsen is a 51 y.o. very pleasant female patient who presents with the following:  She is here with a rash on her chest today.  She had this rash about a year ago- seemed to start after she had been doing a lot of exercise outdoors and sweating.  It took her a while to get rid of it last time.  The rash is very itchy. It started 4 or 5 weeks ago.  She has used some oTC medications and lotromin spray which does help.  However, if the area gets overheated it gets worse.  She was treated with kenalog and an oral steroid pack in the past which finally resolved the rash.    Her OB appt should be soon- her bleeding has gotten better.  However, she is frustrated by weight gain and fluid retention which seems to occur when she is on OCP.  She does understand that chronic diuretics may not be a good idea for her and is ok with not using them.   There is no problem list on file for this patient.   Past Medical History  Diagnosis Date  . Herpes   . Endometriosis   . Mitral valve prolapse   . IBS (irritable bowel syndrome)   . GERD (gastroesophageal reflux disease)   . IBS (irritable bowel syndrome)   . HSV (herpes simplex virus) infection   . Insomnia   . Endometriosis     Past Surgical History  Procedure Date  . Bladder repair     AGE 23  . Pelvic laparoscopy     X 5  . Urethral stricture dilatation     History  Substance Use Topics  . Smoking status: Former Games developer  . Smokeless tobacco: Not on file  . Alcohol Use: Yes    Family History  Problem Relation Age of Onset  . Cancer Mother     VULVAR CANCER  . Heart disease Father   . Cancer Brother     SARCOMA  . Heart disease Brother     ALSO GRANDFATHER W CA  NOT SPECIFIED IF PAT OR MAT  . Diabetes Paternal Grandmother     Allergies  Allergen Reactions  . Avelox (Moxifloxacin Hcl In Nacl)   . Entex   . Erythromycin   . Levofloxacin   . Sulfa Antibiotics   . Tetracyclines & Related     Medication list has been reviewed and updated.  Current Outpatient Prescriptions on File Prior to Visit  Medication Sig Dispense Refill  . ibuprofen (ADVIL,MOTRIN) 200 MG tablet Take 200 mg by mouth.        . levonorgestrel-ethinyl estradiol (AVIANE,ALESSE,LESSINA) 0.1-20 MG-MCG tablet Take 1 tablet by mouth daily.  1 Package  6  . acyclovir (ZOVIRAX) 400 MG tablet Take 1 tablet (400 mg total) by mouth 2 (two) times daily.  30 tablet  1  . dicyclomine (BENTYL) 10 MG capsule Take 10 mg by mouth.        . Estradiol-Norethindrone Acet 0.5-0.1 MG per tablet Take 1 tablet by mouth daily.  30 tablet  3  . methylPREDNISolone (MEDROL DOSEPAK) 4 MG tablet Take 4 mg by mouth. follow package directions      .  norethindrone-ethinyl estradiol (LOESTRIN 1/20, 21,) 1-20 MG-MCG tablet Take 1 tablet by mouth daily.  1 Package  11  . norgestrel-ethinyl estradiol (LO/OVRAL) 0.3-30 MG-MCG tablet Take 1 tablet by mouth daily.  1 Package  7  . nystatin (MYCOSTATIN) cream Apply 1 application topically 2 (two) times daily.          Review of Systems:  As per HPI- otherwise negative.   Physical Examination: Filed Vitals:   11/15/11 0858  BP: 112/68  Pulse: 70  Temp: 98.1 F (36.7 C)  Resp: 16   Filed Vitals:   11/15/11 0858  Height: 5\' 8"  (1.727 m)  Weight: 194 lb (87.998 kg)   Body mass index is 29.50 kg/(m^2). Ideal Body Weight: Weight in (lb) to have BMI = 25: 164.1   GEN: WDWN, NAD, Non-toxic, A & O x 3, overweight HEENT: Atraumatic, Normocephalic. Neck supple. No masses, No LAD. Ears and Nose: No external deformity. CV: RRR, No M/G/R. No JVD. No thrill. No extra heart sounds. PULM: CTA B, no wheezes, crackles, rhonchi. No retractions. No resp. distress. No  accessory muscle use. Thunk: there is a mild rash over her chest unde her bra line bilaterally.  No sign of shingles, no vesicular.  Scattered papules but no pustules.  Does not appear to be candida EXTR: No c/c/e NEURO Normal gait.  PSYCH: Normally interactive. Conversant. Not depressed or anxious appearing.  Calm demeanor.    Assessment and Plan: 1. Rash  triamcinolone ointment (KENALOG) 0.5 %  2. Itching  predniSONE (DELTASONE) 20 MG tablet   Recurrent rash that has gotten better with kenalog and prednisone in the past.  She will try the ointment first and add prednisone if needed.  If this is not helpful she will let us know.    Abbe Amsterdam, MD

## 2011-12-14 ENCOUNTER — Ambulatory Visit (INDEPENDENT_AMBULATORY_CARE_PROVIDER_SITE_OTHER): Payer: 59 | Admitting: Internal Medicine

## 2011-12-14 VITALS — BP 129/85 | HR 80 | Temp 98.2°F | Resp 20 | Ht 67.5 in | Wt 194.0 lb

## 2011-12-14 DIAGNOSIS — L538 Other specified erythematous conditions: Secondary | ICD-10-CM

## 2011-12-14 DIAGNOSIS — J329 Chronic sinusitis, unspecified: Secondary | ICD-10-CM

## 2011-12-14 DIAGNOSIS — L304 Erythema intertrigo: Secondary | ICD-10-CM

## 2011-12-14 DIAGNOSIS — T7840XA Allergy, unspecified, initial encounter: Secondary | ICD-10-CM

## 2011-12-14 MED ORDER — BENZONATATE 100 MG PO CAPS: 100.0000 mg | ORAL_CAPSULE | Freq: Three times a day (TID) | ORAL | Status: DC | PRN

## 2011-12-14 MED ORDER — CLOTRIMAZOLE-BETAMETHASONE 1-0.05 % EX CREA: TOPICAL_CREAM | Freq: Two times a day (BID) | CUTANEOUS | Status: DC

## 2011-12-14 MED ORDER — FLUCONAZOLE 150 MG PO TABS: 150.0000 mg | ORAL_TABLET | Freq: Once | ORAL | Status: DC

## 2011-12-14 MED ORDER — AZITHROMYCIN 250 MG PO TABS: ORAL_TABLET | ORAL | Status: DC

## 2011-12-14 NOTE — Progress Notes (Signed)
  Subjective:    Patient ID: Tracy Larsen, female    DOB: Dec 02, 1960, 51 y.o.   MRN: [id number]  HPIexposed to cleaning agent w/ resulting cough,sore throat, rhinorrhea for pasr 4 days Nose bleeding thurs Dr Henderson Cloud new GYN Today yellow bloody mucus No fever Known bleach allergy  Rash chest for months/derm twicw w/out resolution/pred po and topical ?aggrav by bra plus sweat  Hx of yeast with antibios  Review of Systems No fever chills sweats wt lose Working with gyn on estrogen level re menorrhagia    Objective:   Physical Exam VS stable Tm clear Nares purulent thr red wout exud Lungs clear Skin=red papulovesic scaly tween and under breasts      Assessment & Plan:  1) allergic reaction 2) sinusitis 3) intertrigo  Meds ordered this encounter  Medications  . azithromycin (ZITHROMAX) 250 MG tablet    Sig: As packaged    Dispense:  6 tablet    Refill:  1  . benzonatate (TESSALON) 100 MG capsule    Sig: Take 1 capsule (100 mg total) by mouth 3 (three) times daily as needed for cough.    Dispense:  30 capsule    Refill:  2  . clotrimazole-betamethasone (LOTRISONE) cream    Sig: Apply topically 2 (two) times daily.    Dispense:  30 g    Refill:  1  . fluconazole (DIFLUCAN) 150 MG tablet    Sig: Take 1 tablet (150 mg total) by mouth once. Use one dose weekly for 4 weeks    Dispense:  4 tablet    Refill:  0   rechk 2 weeks

## 2011-12-15 ENCOUNTER — Other Ambulatory Visit: Payer: Self-pay | Admitting: Obstetrics and Gynecology

## 2012-08-16 ENCOUNTER — Ambulatory Visit: Payer: 59 | Admitting: Cardiology

## 2012-09-24 ENCOUNTER — Encounter: Payer: Self-pay | Admitting: Cardiology

## 2012-09-24 ENCOUNTER — Encounter: Payer: Self-pay | Admitting: *Deleted

## 2012-09-24 ENCOUNTER — Ambulatory Visit (INDEPENDENT_AMBULATORY_CARE_PROVIDER_SITE_OTHER): Payer: 59 | Admitting: Cardiology

## 2012-09-24 VITALS — BP 137/79 | HR 75 | Ht 67.5 in | Wt 192.1 lb

## 2012-09-24 DIAGNOSIS — I059 Rheumatic mitral valve disease, unspecified: Secondary | ICD-10-CM

## 2012-09-24 DIAGNOSIS — G459 Transient cerebral ischemic attack, unspecified: Secondary | ICD-10-CM

## 2012-09-24 DIAGNOSIS — I341 Nonrheumatic mitral (valve) prolapse: Secondary | ICD-10-CM

## 2012-09-24 NOTE — Patient Instructions (Addendum)
Your physician recommends that you schedule a follow-up appointment in: AS NEEDED PENDING TEST RESULTS  Your physician has requested that you have an echocardiogram. Echocardiography is a painless test that uses sound waves to create images of your heart. It provides your doctor with information about the size and shape of your heart and how well your heart's chambers and valves are working. This procedure takes approximately one hour. There are no restrictions for this procedure.   Your physician has requested that you have a carotid duplex. This test is an ultrasound of the carotid arteries in your neck. It looks at blood flow through these arteries that supply the brain with blood. Allow one hour for this exam. There are no restrictions or special instructions.

## 2012-09-24 NOTE — Assessment & Plan Note (Signed)
Etiology of symptoms unclear. There are bilateral and therefore I think less likely to be embolic or related to carotid disease. Will schedule carotid Dopplers to further evaluate.

## 2012-09-24 NOTE — Assessment & Plan Note (Addendum)
Plan repeat echocardiogram. Patient also asked about SBE prophylaxis. I explained that SBE prophylaxis is no longer indicated for mitral valve prolapse.

## 2012-09-24 NOTE — Progress Notes (Signed)
HPI: 52 year old female for evaluation of mitral valve prolapse. Patient was seen by Dr. Elease Hashimoto approximately 6-7 years ago. I do not have those records available. She had a stress test that was apparently unremarkable. An echocardiogram apparently showed mitral valve prolapse. She does not have dyspnea on exertion, orthopnea, PND, pedal edema or exertional chest pain. No syncope. Occasional brief palpitations described as a flutter but not sustained. Over the past 3 years she has had occasions where she has visual disturbance. She feels like the shades are being lowered bilaterally. This lasted approximately 15 seconds and resolves. There is no other neurological symptoms.  Current Outpatient Prescriptions  Medication Sig Dispense Refill  . acyclovir (ZOVIRAX) 400 MG tablet Take 400 mg by mouth as needed.      . norgestrel-ethinyl estradiol (LO/OVRAL) 0.3-30 MG-MCG tablet Take 1 tablet by mouth daily.  1 Package  7   No current facility-administered medications for this visit.    Allergies  Allergen Reactions  . Avelox [Moxifloxacin Hcl In Nacl]   . Entex   . Erythromycin   . Levofloxacin   . Sulfa Antibiotics   . Tetracyclines & Related     Past Medical History  Diagnosis Date  . Herpes   . Endometriosis   . Mitral valve prolapse   . GERD (gastroesophageal reflux disease)   . IBS (irritable bowel syndrome)   . HSV (herpes simplex virus) infection   . Insomnia   . Asthma     Past Surgical History  Procedure Laterality Date  . Bladder repair      AGE 28  . Pelvic laparoscopy      X 5  . Urethral stricture dilatation    . Nasal sinus surgery      History   Social History  . Marital Status: Married    Spouse Name: N/A    Number of Children: 1  . Years of Education: N/A   Occupational History  . [employer] TECHNICIAN    Social History Main Topics  . Smoking status: Former Games developer  . Smokeless tobacco: Not on file  . Alcohol Use: Yes     Comment: Occasional  .  Drug Use: No  . Sexual Activity: Yes    Birth Control/ Protection: None   Other Topics Concern  . Not on file   Social History Narrative  . No narrative on file    Family History  Problem Relation Age of Onset  . Cancer Mother     VULVAR CANCER  . Heart disease Father     MI in his 15s  . Cancer Brother     SARCOMA  . Heart disease Brother     WPW  . Diabetes Paternal Grandmother     ROS: no fevers or chills, productive cough, hemoptysis, dysphasia, odynophagia, melena, hematochezia, dysuria, hematuria, rash, seizure activity, orthopnea, PND, pedal edema, claudication. Remaining systems are negative.  Physical Exam:   Blood pressure 137/79, pulse 75, height 5' 7.5" (1.715 m), weight 192 lb 1.9 oz (87.145 kg).  General:  Well developed/well nourished in NAD Skin warm/dry Patient not depressed No peripheral clubbing Back-normal HEENT-normal/normal eyelids Neck supple/normal carotid upstroke bilaterally; no bruits; no JVD; no thyromegaly chest - CTA/ normal expansion CV - RRR/normal S1 and S2; no murmurs, rubs or gallops;  PMI nondisplaced Abdomen -NT/ND, no HSM, no mass, + bowel sounds, no bruit 2+ femoral pulses, no bruits Ext-no edema, chords, 2+ DP Neuro-grossly nonfocal  ECG sinus rhythm at a rate of 59. Left anterior  fascicular block. No ST changes.

## 2012-12-15 ENCOUNTER — Ambulatory Visit (INDEPENDENT_AMBULATORY_CARE_PROVIDER_SITE_OTHER): Payer: 59 | Admitting: Family Medicine

## 2012-12-15 VITALS — BP 140/82 | HR 77 | Temp 98.5°F | Resp 18 | Ht 67.0 in | Wt 192.8 lb

## 2012-12-15 DIAGNOSIS — R05 Cough: Secondary | ICD-10-CM

## 2012-12-15 DIAGNOSIS — R059 Cough, unspecified: Secondary | ICD-10-CM

## 2012-12-15 DIAGNOSIS — J029 Acute pharyngitis, unspecified: Secondary | ICD-10-CM

## 2012-12-15 LAB — POCT CBC
Hemoglobin: 13.6 g/dL (ref 12.2–16.2)
Lymph, poc: 2.2 (ref 0.6–3.4)
MCH, POC: 29.4 pg (ref 27–31.2)
MCHC: 31.6 g/dL — AB (ref 31.8–35.4)
MCV: 93.1 fL (ref 80–97)
MPV: 8.5 fL (ref 0–99.8)
POC MID %: 6.2 %M (ref 0–12)
RBC: 4.63 M/uL (ref 4.04–5.48)
WBC: 7.1 10*3/uL (ref 4.6–10.2)

## 2012-12-15 MED ORDER — AZITHROMYCIN 250 MG PO TABS: ORAL_TABLET | ORAL | Status: DC

## 2012-12-15 NOTE — Patient Instructions (Signed)
Use the zpack as directed. If you are not getting better or if you are getting worse please let me know.  If you continue to cough up blood we will need to do a CXR.   Discuss your OCP with your OB at your next visit.  You might want to reconsider this medication due to your daughter's recent blood clot.

## 2012-12-15 NOTE — Progress Notes (Addendum)
[facility] [address], [place] [address] [phone]  Date:  12/15/2012   Name:  Tracy Larsen   DOB:  March 19, 1960   MRN:  [id number]  PCP:  Tonye Pearson, MD    Chief Complaint: Cough and Sore Throat   History of Present Illness:  Tracy Larsen is a 52 y.o. very pleasant female patient who presents with the following:  Here today with illness.  She tends to get "bronchitis" every October- in a veyr predictable way.   She got ill in early/ middle October with sinus symptoms.  She took a course of amox 500 TID (left over from her dentist) along with mucinex.  She took this for a week and finished about 10 days ago.  This never seemed to get her well.    Her husband and co- workers have been ill as well.   She has coughed up some green mucus tinged with blood for the last 2 days.  She has noted a ST especially at night for the last 2 days.  Her glands feel swollen  She does keep her grandchild a lot. He is 70 months old.    She has not noted fever.  She did have some chills and mild aches a few weeks ago. No GI symptoms.  She continues to have stuffy and runny nose- this is worse than usual but she does have some drainage at baseline.  She is a former smoker (quit 12 years ago). Does take OCP; she uses her pill continuously.    Her daughter (who is a smoker and on OCP) recently had a DVT.  Jerrie reports she is underoing a full hypercoag work- up  No SOB  Patient Active Problem List   Diagnosis Date Noted  . Mitral valve prolapse 09/24/2012  . TIA (transient ischemic attack) 09/24/2012    Past Medical History  Diagnosis Date  . Herpes   . Endometriosis   . Mitral valve prolapse   . GERD (gastroesophageal reflux disease)   . IBS (irritable bowel syndrome)   . HSV (herpes simplex virus) infection   . Insomnia   . Asthma   . Allergy   . Arthritis     Past Surgical History  Procedure Laterality Date  . Bladder repair     AGE 88  . Pelvic laparoscopy      X 5  . Urethral stricture dilatation    . Nasal sinus surgery      History  Substance Use Topics  . Smoking status: Former Games developer  . Smokeless tobacco: Not on file  . Alcohol Use: Yes     Comment: Occasional    Family History  Problem Relation Age of Onset  . Cancer Mother     VULVAR CANCER  . Heart disease Father     MI in his 36s  . Cancer Brother     SARCOMA  . Heart disease Brother     WPW  . Diabetes Paternal Grandmother     Allergies  Allergen Reactions  . Augmentin [Amoxicillin-Pot Clavulanate] Shortness Of Breath  . Avelox [Moxifloxacin Hcl In Nacl]   . Entex   . Erythromycin   . Levofloxacin   . Sulfa Antibiotics   . Tetracyclines & Related     Medication list has been reviewed and updated.  Current Outpatient Prescriptions on File Prior to Visit  Medication Sig Dispense Refill  . acyclovir (ZOVIRAX) 400 MG tablet Take 400 mg by mouth  as needed.      . norgestrel-ethinyl estradiol (LO/OVRAL) 0.3-30 MG-MCG tablet Take 1 tablet by mouth daily.  1 Package  7   No current facility-administered medications on file prior to visit.    Review of Systems:  As per HPI- otherwise negative.   Physical Examination: Filed Vitals:   12/15/12 0943  BP: 140/82  Pulse: 77  Temp: 98.5 F (36.9 C)  Resp: 18   Filed Vitals:   12/15/12 0943  Height: 5\' 7"  (1.702 m)  Weight: 192 lb 12.8 oz (87.454 kg)   Body mass index is 30.19 kg/(m^2). Ideal Body Weight: Weight in (lb) to have BMI = 25: 159.3  GEN: WDWN, NAD, Non-toxic, A & O x 3, overweight, looks well HEENT: Atraumatic, Normocephalic. Neck supple. No masses, No LAD.  Bilateral TM wnl, oropharynx normal.  PEERL,EOMI.   Ears and Nose: No external deformity. CV: RRR, No M/G/R. No JVD. No thrill. No extra heart sounds. PULM: CTA B, no wheezes, crackles, rhonchi. No retractions. No resp. distress. No accessory muscle use. ABD: S, NT, ND, +BS. No rebound. No HSM. EXTR: No  c/c/e NEURO Normal gait.  PSYCH: Normally interactive. Conversant. Not depressed or anxious appearing.  Calm demeanor.   Results for orders placed in visit on 12/15/12  POCT CBC      Result Value Range   WBC 7.1  4.6 - 10.2 K/uL   Lymph, poc 2.2  0.6 - 3.4   POC LYMPH PERCENT 30.9  10 - 50 %L   MID (cbc) 0.4  0 - 0.9   POC MID % 6.2  0 - 12 %M   POC Granulocyte 4.5  2 - 6.9   Granulocyte percent 62.9  37 - 80 %G   RBC 4.63  4.04 - 5.48 M/uL   Hemoglobin 13.6  12.2 - 16.2 g/dL   HCT, POC 45.4  09.8 - 47.9 %   MCV 93.1  80 - 97 fL   MCH, POC 29.4  27 - 31.2 pg   MCHC 31.6 (*) 31.8 - 35.4 g/dL   RDW, POC 11.9     Platelet Count, POC 263  142 - 424 K/uL   MPV 8.5  0 - 99.8 fL  POCT RAPID STREP A (OFFICE)      Result Value Range   Rapid Strep A Screen Negative  Negative   Assessment and Plan: Acute pharyngitis - Plan: POCT rapid strep A, Culture, Group A Strep, azithromycin (ZITHROMAX) 250 MG tablet  Cough - Plan: POCT CBC, azithromycin (ZITHROMAX) 250 MG tablet  Persistent cough- will treat with azithromycin.  Recommended a CXR due to coughing up blood; however she declines this today due to cost.  She does not feel it is necessary as she only coughed up a very minimal amount of blood mixed in mucus.    New family history of DVT; discussed the fact that she cannot stay on OCP forever, and that this could increase her risk of DVT.  Her daughter had extensive BW to look for any hereditary hypercoagulable condition.  In any case, she will discuss this with her OBG at their next visit.  She has NOT noted any sx of SOB and VS are reassuring so at this time do not suspect SOB.  However she knows to call or RTC/ ER if she does have any CP or SOB  Signed Abbe Amsterdam, MD  11/23: received throat culture, negative.  Called and LMOM with this information. Let me know if not  getting better

## 2012-12-17 LAB — CULTURE, GROUP A STREP: Organism ID, Bacteria: NORMAL

## 2012-12-27 ENCOUNTER — Other Ambulatory Visit: Payer: Self-pay | Admitting: Obstetrics and Gynecology

## 2013-04-22 ENCOUNTER — Ambulatory Visit (INDEPENDENT_AMBULATORY_CARE_PROVIDER_SITE_OTHER): Payer: 59 | Admitting: Physician Assistant

## 2013-04-22 VITALS — BP 120/80 | HR 61 | Temp 98.2°F | Resp 16 | Ht 68.0 in | Wt 191.0 lb

## 2013-04-22 DIAGNOSIS — K589 Irritable bowel syndrome without diarrhea: Secondary | ICD-10-CM

## 2013-04-22 DIAGNOSIS — J329 Chronic sinusitis, unspecified: Secondary | ICD-10-CM

## 2013-04-22 DIAGNOSIS — K5900 Constipation, unspecified: Secondary | ICD-10-CM

## 2013-04-22 MED ORDER — DOCUSATE SODIUM 100 MG PO CAPS: 300.0000 mg | ORAL_CAPSULE | Freq: Every day | ORAL | Status: DC

## 2013-04-22 MED ORDER — AZITHROMYCIN 250 MG PO TABS: ORAL_TABLET | ORAL | Status: AC

## 2013-04-22 MED ORDER — GUAIFENESIN ER 1200 MG PO TB12: 1.0000 | ORAL_TABLET | Freq: Two times a day (BID) | ORAL | Status: AC

## 2013-04-22 MED ORDER — POLYETHYLENE GLYCOL 3350 17 GM/SCOOP PO POWD: 17.0000 g | Freq: Two times a day (BID) | ORAL | Status: DC | PRN

## 2013-04-22 NOTE — Patient Instructions (Signed)
Miralax - up to 4 capfuls to start the regular stool process - you could try this during the weekend to see if we could clean you out a little bit - then see how much you can tolerate without the gas - even if it is just 1/2 capful daily it will help  Colace - this will make your stool soft - take it every day to prevent hard stools  Increase the fiber in your diet - fiber gummies are ok to continue

## 2013-04-22 NOTE — Progress Notes (Signed)
   Subjective:    Patient ID: Tracy Larsen, female    DOB: 05/16/1960, 53 y.o.   MRN: [id number]  HPI Pt presents to clinic with 2 concerns 1- over the last 3 weeks she has had some nasal congestion but most of the problems is with nasal dryness and scabs - then several days ago she had increased sinus pressure with PND and minimal clear rhinorrhea - she developed laryngitis yesterday and this is typically her sign that she has sinus infection.  She gets them when the seasons change - she takes Alavert daily to help with vertigo and has been using her nasacort but did make the scabs worse in her nose.  She currently does not have vertigo. 2- she is having problems with constipation - she has IBS and for years had diarrhea prominent IBS but now she suffers with constipation daily - she will routinely have to manually disimpact due to the harness and size of the stool - she has had to use stimulant laxatives recently because she is so uncomfortable.  OTC meds - Nasacort, alavert Sinus surgery in 1993  Review of Systems  Constitutional: Positive for chills. Negative for fever.  HENT: Positive for congestion, postnasal drip, rhinorrhea (clear), sinus pressure and voice change.   Respiratory: Positive for cough (green).   Neurological: Positive for headaches.       Objective:   Physical Exam  Vitals reviewed. Constitutional: She is oriented to person, place, and time. She appears well-developed and well-nourished.  HENT:  Head: Normocephalic and atraumatic.  Right Ear: Hearing, tympanic membrane, external ear and ear canal normal.  Left Ear: Hearing, tympanic membrane, external ear and ear canal normal.  Nose: Mucosal edema (pale pink) present.  Mouth/Throat: Uvula is midline, oropharynx is clear and moist and mucous membranes are normal.  Hoarse  Cardiovascular: Normal rate, regular rhythm and normal heart sounds.   No murmur heard. Pulmonary/Chest: Effort normal and breath sounds  normal. She has no wheezes.  Neurological: She is alert and oriented to person, place, and time.  Skin: Skin is warm and dry.  Psychiatric: She has a normal mood and affect. Her behavior is normal. Judgment and thought content normal.       Assessment & Plan:  Sinus infection - Plan: azithromycin (ZITHROMAX Z-PAK) 250 MG tablet, Guaifenesin (MUCINEX MAXIMUM STRENGTH) 1200 MG TB12 --- we discussed that Zithromax is not the best abx for sinus infection but with her allergies and her h/o sinus surgery and tolerating the Z pack and getting results - we start that and add mucinex to help with getting her mucus out  Constipation - Pt knows she needs a colonoscopy but is unable to offer it at this time.  We discussed ways to deal with constipation --- Plan: polyethylene glycol powder (GLYCOLAX/MIRALAX) powder, docusate sodium (COLACE) 100 MG capsule  Benny LennertSarah Weber PA-C  [facility] 04/22/2013 9:57 AM

## 2013-09-28 ENCOUNTER — Encounter (HOSPITAL_COMMUNITY): Payer: Self-pay | Admitting: Emergency Medicine

## 2013-09-28 ENCOUNTER — Emergency Department (HOSPITAL_COMMUNITY)
Admission: EM | Admit: 2013-09-28 | Discharge: 2013-09-28 | Disposition: A | Discharge status: Home / Self Care | Payer: 59 | Attending: Emergency Medicine | Admitting: Emergency Medicine

## 2013-09-28 ENCOUNTER — Emergency Department (HOSPITAL_COMMUNITY): Payer: 59

## 2013-09-28 DIAGNOSIS — Z8619 Personal history of other infectious and parasitic diseases: Secondary | ICD-10-CM | POA: Diagnosis not present

## 2013-09-28 DIAGNOSIS — K5289 Other specified noninfective gastroenteritis and colitis: Secondary | ICD-10-CM | POA: Diagnosis not present

## 2013-09-28 DIAGNOSIS — J45909 Unspecified asthma, uncomplicated: Secondary | ICD-10-CM | POA: Insufficient documentation

## 2013-09-28 DIAGNOSIS — Z8719 Personal history of other diseases of the digestive system: Secondary | ICD-10-CM | POA: Diagnosis not present

## 2013-09-28 DIAGNOSIS — Z87891 Personal history of nicotine dependence: Secondary | ICD-10-CM | POA: Diagnosis not present

## 2013-09-28 DIAGNOSIS — R110 Nausea: Secondary | ICD-10-CM | POA: Insufficient documentation

## 2013-09-28 DIAGNOSIS — R1031 Right lower quadrant pain: Secondary | ICD-10-CM | POA: Insufficient documentation

## 2013-09-28 DIAGNOSIS — Z8739 Personal history of other diseases of the musculoskeletal system and connective tissue: Secondary | ICD-10-CM | POA: Insufficient documentation

## 2013-09-28 DIAGNOSIS — R197 Diarrhea, unspecified: Secondary | ICD-10-CM | POA: Insufficient documentation

## 2013-09-28 DIAGNOSIS — IMO0002 Reserved for concepts with insufficient information to code with codable children: Secondary | ICD-10-CM | POA: Diagnosis not present

## 2013-09-28 DIAGNOSIS — Z8679 Personal history of other diseases of the circulatory system: Secondary | ICD-10-CM | POA: Insufficient documentation

## 2013-09-28 DIAGNOSIS — K529 Noninfective gastroenteritis and colitis, unspecified: Secondary | ICD-10-CM

## 2013-09-28 DIAGNOSIS — Z3202 Encounter for pregnancy test, result negative: Secondary | ICD-10-CM | POA: Insufficient documentation

## 2013-09-28 DIAGNOSIS — R6883 Chills (without fever): Secondary | ICD-10-CM | POA: Insufficient documentation

## 2013-09-28 DIAGNOSIS — Z79899 Other long term (current) drug therapy: Secondary | ICD-10-CM | POA: Insufficient documentation

## 2013-09-28 LAB — CBC WITH DIFFERENTIAL/PLATELET
BASOS ABS: 0 10*3/uL (ref 0.0–0.1)
Basophils Relative: 0 % (ref 0–1)
EOS PCT: 4 % (ref 0–5)
Eosinophils Absolute: 0.2 10*3/uL (ref 0.0–0.7)
HCT: 43.2 % (ref 36.0–46.0)
Hemoglobin: 14.4 g/dL (ref 12.0–15.0)
LYMPHS ABS: 1.9 10*3/uL (ref 0.7–4.0)
Lymphocytes Relative: 40 % (ref 12–46)
MCH: 28.8 pg (ref 26.0–34.0)
MCHC: 33.3 g/dL (ref 30.0–36.0)
MCV: 86.4 fL (ref 78.0–100.0)
Monocytes Absolute: 0.7 10*3/uL (ref 0.1–1.0)
Monocytes Relative: 14 % — ABNORMAL HIGH (ref 3–12)
NEUTROS PCT: 42 % — AB (ref 43–77)
Neutro Abs: 2 10*3/uL (ref 1.7–7.7)
PLATELETS: 209 10*3/uL (ref 150–400)
RBC: 5 MIL/uL (ref 3.87–5.11)
RDW: 12.4 % (ref 11.5–15.5)
WBC: 4.8 10*3/uL (ref 4.0–10.5)

## 2013-09-28 LAB — COMPREHENSIVE METABOLIC PANEL
ALK PHOS: 122 U/L — AB (ref 39–117)
ALT: 13 U/L (ref 0–35)
AST: 19 U/L (ref 0–37)
Albumin: 4 g/dL (ref 3.5–5.2)
Anion gap: 12 (ref 5–15)
BUN: 13 mg/dL (ref 6–23)
CALCIUM: 9.6 mg/dL (ref 8.4–10.5)
CO2: 28 mEq/L (ref 19–32)
Chloride: 101 mEq/L (ref 96–112)
Creatinine, Ser: 0.99 mg/dL (ref 0.50–1.10)
GFR calc Af Amer: 75 mL/min — ABNORMAL LOW (ref >90)
GFR calc non Af Amer: 65 mL/min — ABNORMAL LOW (ref >90)
GLUCOSE: 100 mg/dL — AB (ref 70–99)
POTASSIUM: 4.3 meq/L (ref 3.7–5.3)
SODIUM: 141 meq/L (ref 137–147)
Total Bilirubin: 0.5 mg/dL (ref 0.3–1.2)
Total Protein: 7.8 g/dL (ref 6.0–8.3)

## 2013-09-28 LAB — URINALYSIS, ROUTINE W REFLEX MICROSCOPIC
Bilirubin Urine: NEGATIVE
GLUCOSE, UA: NEGATIVE mg/dL
Ketones, ur: NEGATIVE mg/dL
Nitrite: NEGATIVE
PH: 5.5 (ref 5.0–8.0)
Protein, ur: NEGATIVE mg/dL
Specific Gravity, Urine: 1.02 (ref 1.005–1.030)
Urobilinogen, UA: 0.2 mg/dL (ref 0.0–1.0)

## 2013-09-28 LAB — LIPASE, BLOOD: Lipase: 20 U/L (ref 11–59)

## 2013-09-28 LAB — URINE MICROSCOPIC-ADD ON

## 2013-09-28 LAB — POC URINE PREG, ED: PREG TEST UR: NEGATIVE

## 2013-09-28 LAB — POC OCCULT BLOOD, ED: Fecal Occult Bld: NEGATIVE

## 2013-09-28 MED ORDER — HYDROMORPHONE HCL PF 1 MG/ML IJ SOLN
0.5000 mg | INTRAMUSCULAR | Status: AC
  Administered 2013-09-28: 0.5 mg via INTRAVENOUS
  Filled 2013-09-28: qty 1

## 2013-09-28 MED ORDER — METOCLOPRAMIDE HCL 5 MG PO TABS: 5.0000 mg | ORAL_TABLET | Freq: Three times a day (TID) | ORAL | Status: DC | PRN

## 2013-09-28 MED ORDER — IOHEXOL 300 MG/ML  SOLN
50.0000 mL | Freq: Once | INTRAMUSCULAR | Status: AC | PRN
  Administered 2013-09-28: 50 mL via ORAL

## 2013-09-28 MED ORDER — IOHEXOL 300 MG/ML  SOLN
100.0000 mL | Freq: Once | INTRAMUSCULAR | Status: AC | PRN
  Administered 2013-09-28: 100 mL via INTRAVENOUS

## 2013-09-28 MED ORDER — METOCLOPRAMIDE HCL 5 MG/ML IJ SOLN
5.0000 mg | Freq: Once | INTRAMUSCULAR | Status: AC
  Administered 2013-09-28: 5 mg via INTRAVENOUS
  Filled 2013-09-28: qty 2

## 2013-09-28 MED ORDER — SODIUM CHLORIDE 0.9 % IV BOLUS (SEPSIS)
1000.0000 mL | INTRAVENOUS | Status: AC
  Administered 2013-09-28: 1000 mL via INTRAVENOUS

## 2013-09-28 MED ORDER — OXYCODONE-ACETAMINOPHEN 5-325 MG PO TABS: 1.0000 | ORAL_TABLET | Freq: Four times a day (QID) | ORAL | Status: DC | PRN

## 2013-09-28 NOTE — ED Notes (Signed)
She c/o generalized abd. Discomfort with diarrhea stools per day: "Too many to count".  She is in no distress.  She states she had similar symptomology ~ 2 years ago at which time she was dx with "bowel blockage" which spontaneously (no surgery) resolved. She is with her husband, and is in no distress.

## 2013-09-28 NOTE — Discharge Instructions (Signed)

## 2013-09-28 NOTE — ED Provider Notes (Addendum)
CSN: [id number]     Arrival date & time 09/28/13  8295 History   First MD Initiated Contact with Patient 09/28/13 [phone]     No chief complaint on file.    (Consider location/radiation/quality/duration/timing/severity/associated sxs/prior Treatment) Patient is a 53 y.o. female presenting with diarrhea. The history is provided by the patient.  Diarrhea Diarrhea characteristics: dark. Severity:  Moderate Onset quality:  Sudden Duration:  2 days Timing:  Constant Progression:  Unchanged Relieved by:  Nothing Worsened by:  Nothing tried Ineffective treatments:  None tried Associated symptoms: abdominal pain and chills   Associated symptoms: no fever, no headaches and no vomiting   Abdominal pain:    Location:  RLQ   Quality:  Sharp   Severity:  Moderate   Onset quality:  Gradual   Duration:  1 day   Timing:  Constant   Progression:  Unchanged   Chronicity:  New (similar to pain she has had w/ endometriosis in the past)   Past Medical History  Diagnosis Date  . Herpes   . Endometriosis   . Mitral valve prolapse   . GERD (gastroesophageal reflux disease)   . IBS (irritable bowel syndrome)   . HSV (herpes simplex virus) infection   . Insomnia   . Asthma   . Allergy   . Arthritis    Past Surgical History  Procedure Laterality Date  . Bladder repair      AGE 23  . Pelvic laparoscopy      X 5  . Urethral stricture dilatation    . Nasal sinus surgery     Family History  Problem Relation Age of Onset  . Cancer Mother     VULVAR CANCER  . Heart disease Father     MI in his 38s  . Cancer Brother     SARCOMA  . Heart disease Brother     WPW  . Diabetes Paternal Grandmother    History  Substance Use Topics  . Smoking status: Former Games developer  . Smokeless tobacco: Not on file  . Alcohol Use: Yes     Comment: Occasional   OB History   Grav Para Term Preterm Abortions TAB SAB Ect Mult Living   Review of Systems  Constitutional: Positive for  chills. Negative for fever and fatigue.  HENT: Negative for congestion and drooling.   Eyes: Negative for pain.  Respiratory: Negative for cough and shortness of breath.   Cardiovascular: Negative for chest pain.  Gastrointestinal: Positive for nausea, abdominal pain and diarrhea. Negative for vomiting.  Genitourinary: Negative for dysuria and hematuria.  Musculoskeletal: Negative for back pain, gait problem and neck pain.  Skin: Negative for color change.  Neurological: Negative for dizziness and headaches.  Hematological: Negative for adenopathy.  Psychiatric/Behavioral: Negative for behavioral problems.  All other systems reviewed and are negative.     Allergies  Augmentin; Avelox; Entex; Erythromycin; Levofloxacin; Sulfa antibiotics; and Tetracyclines & related  Home Medications   Prior to Admission medications   Medication Sig Start Date End Date Taking? Authorizing Provider  acyclovir (ZOVIRAX) 400 MG tablet Take 400 mg by mouth as needed. 03/11/11   Dara Lords, MD  docusate sodium (COLACE) 100 MG capsule Take 3 capsules (300 mg total) by mouth daily. 04/22/13   Morrell Riddle, PA-C  polyethylene glycol powder (GLYCOLAX/MIRALAX) powder Take 17 g by mouth 2 (two) times daily as needed. 04/22/13   Dema Severin  Weber, PA-C  triamcinolone (NASACORT AQ) 55 MCG/ACT AERO nasal inhaler Place 2 sprays into the nose daily.    Historical Provider, MD   BP 101/62  Pulse 81  Temp(Src) 98.2 F (36.8 C) (Oral)  Resp 18  SpO2 100% Physical Exam  Nursing note and vitals reviewed. Constitutional: She is oriented to person, place, and time. She appears well-developed and well-nourished.  HENT:  Head: Normocephalic and atraumatic.  Mouth/Throat: Oropharynx is clear and moist. No oropharyngeal exudate.  Eyes: Conjunctivae and EOM are normal. Pupils are equal, round, and reactive to light.  Neck: Normal range of motion. Neck supple.  Cardiovascular: Normal rate, regular rhythm, normal heart  sounds and intact distal pulses.  Exam reveals no gallop and no friction rub.   No murmur heard. Pulmonary/Chest: Effort normal and breath sounds normal. No respiratory distress. She has no wheezes.  Abdominal: Soft. Bowel sounds are normal. There is tenderness (mild ttp of RLQ). There is no rebound and no guarding.  Genitourinary:  Non-inflamed external hemorrhoids at the inferior aspect of the anus. Brown stool. No gross blood.   Musculoskeletal: Normal range of motion. She exhibits no edema and no tenderness.  Neurological: She is alert and oriented to person, place, and time.  Skin: Skin is warm and dry.  Psychiatric: She has a normal mood and affect. Her behavior is normal.    ED Course  Procedures (including critical care time) Labs Review Labs Reviewed  CBC WITH DIFFERENTIAL - Abnormal; Notable for the following:    Neutrophils Relative % 42 (*)    Monocytes Relative 14 (*)    All other components within normal limits  COMPREHENSIVE METABOLIC PANEL - Abnormal; Notable for the following:    Glucose, Bld 100 (*)    Alkaline Phosphatase 122 (*)    GFR calc non Af Amer 65 (*)    GFR calc Af Amer 75 (*)    All other components within normal limits  URINALYSIS, ROUTINE W REFLEX MICROSCOPIC - Abnormal; Notable for the following:    Color, Urine AMBER (*)    APPearance CLOUDY (*)    Hgb urine dipstick MODERATE (*)    Leukocytes, UA SMALL (*)    All other components within normal limits  URINE MICROSCOPIC-ADD ON - Abnormal; Notable for the following:    Squamous Epithelial / LPF FEW (*)    Bacteria, UA FEW (*)    All other components within normal limits  LIPASE, BLOOD  OCCULT BLOOD X 1 CARD TO LAB, STOOL  POC URINE PREG, ED  POC OCCULT BLOOD, ED    Imaging Review Ct Abdomen Pelvis W Contrast  09/28/2013   CLINICAL DATA:  Right lower quadrant pain with nausea, vomiting and diarrhea.  EXAM: CT ABDOMEN AND PELVIS WITH CONTRAST  TECHNIQUE: Multidetector CT imaging of the abdomen  and pelvis was performed using the standard protocol following bolus administration of intravenous contrast.  CONTRAST:  OMNIPAQUE IOHEXOL 300 MG/ML  SOLN  COMPARISON:  None.  FINDINGS: Lung bases show mild dependent atelectasis bilaterally. Heart size normal. No pericardial or pleural effusion.  Scattered low attenuation lesions in the liver measure up to 8 mm and in the absence of known malignancy, likely represent cysts and/or hemangiomas. Liver, gallbladder and adrenal glands are otherwise unremarkable. There is scarring in the right kidney. A low-attenuation lesion in the lower pole right kidney measures 10 mm and statistically, likely represents a cyst. Kidneys, spleen, pancreas, stomach and small bowel are otherwise unremarkable.  There is thickening  of the wall of the ascending and transverse colon with mild haziness in the surrounding fat. Ileocolic mesenteric lymph nodes measure up to 6 mm. Appendix is normal. Remainder of the colon is unremarkable.  Uterus and ovaries are visualized. Bladder is unremarkable. No pathologically enlarged lymph nodes. There may be trace pelvic free fluid. No worrisome lytic or sclerotic lesions.  IMPRESSION: Wall thickening and pericolonic haziness involving the ascending and transverse colon, indicative of colitis, likely infectious or inflammatory in etiology.   Electronically Signed   By: Leanna Battles M.D.   On: 09/28/2013 09:24     EKG Interpretation None      MDM   Final diagnoses:  Colitis    7:16 AM 53 y.o. female w hx of endometriosis s/p laparoscopy x 6, IBS, who pw abd pain, N, diarrhea for the last 2 days. No blood in stools. She does note dark stools for months, possibly years. Denies any fever. Has had some focal RLQ pain. Will get labs, sx control w/ dilaudid/reglan, CT abd.   10:13 AM: I interpreted/reviewed the labs and/or imaging. CT c/w colitis. Will rec sx therapy. Pt tolerating po here. No recent abx, no recent travel, no  suspicious intake from water source. Hemeoccult neg. Likely viral cause of colitis.  I have discussed the diagnosis/risks/treatment options with the patient and family and believe the pt to be eligible for discharge home to follow-up with GI. We also discussed returning to the ED immediately if new or worsening sx occur. We discussed the sx which are most concerning (e.g., worsening pain, inability to tolerate po) that necessitate immediate return. Medications administered to the patient during their visit and any new prescriptions provided to the patient are listed below.  Medications given during this visit Medications  HYDROmorphone (DILAUDID) injection 0.5 mg (0.5 mg Intravenous Given 09/28/13 0802)  sodium chloride 0.9 % bolus 1,000 mL (0 mLs Intravenous Stopped 09/28/13 0927)  metoCLOPramide (REGLAN) injection 5 mg (5 mg Intravenous Given 09/28/13 0801)  iohexol (OMNIPAQUE) 300 MG/ML solution 50 mL (50 mLs Oral Contrast Given 09/28/13 0740)  iohexol (OMNIPAQUE) 300 MG/ML solution 100 mL (100 mLs Intravenous Contrast Given 09/28/13 0903)    New Prescriptions   METOCLOPRAMIDE (REGLAN) 5 MG TABLET    Take 1 tablet (5 mg total) by mouth every 8 (eight) hours as needed for nausea.   OXYCODONE-ACETAMINOPHEN (PERCOCET) 5-325 MG PER TABLET    Take 1 tablet by mouth every 6 (six) hours as needed.       Purvis Sheffield, MD 09/28/13 1014  Purvis Sheffield, MD 09/28/13 1026

## 2013-10-09 ENCOUNTER — Ambulatory Visit (HOSPITAL_COMMUNITY)
Admission: RE | Admit: 2013-10-09 | Discharge: 2013-10-09 | Disposition: A | Discharge status: Home / Self Care | Payer: 59 | Source: Ambulatory Visit | Attending: Family Medicine | Admitting: Family Medicine

## 2013-10-09 ENCOUNTER — Ambulatory Visit (INDEPENDENT_AMBULATORY_CARE_PROVIDER_SITE_OTHER): Payer: 59 | Admitting: Family Medicine

## 2013-10-09 VITALS — BP 108/74 | HR 76 | Temp 97.6°F | Resp 16 | Ht 67.0 in | Wt 186.0 lb

## 2013-10-09 DIAGNOSIS — M7989 Other specified soft tissue disorders: Secondary | ICD-10-CM

## 2013-10-09 DIAGNOSIS — Z8249 Family history of ischemic heart disease and other diseases of the circulatory system: Secondary | ICD-10-CM

## 2013-10-09 DIAGNOSIS — M79609 Pain in unspecified limb: Secondary | ICD-10-CM | POA: Insufficient documentation

## 2013-10-09 DIAGNOSIS — I808 Phlebitis and thrombophlebitis of other sites: Secondary | ICD-10-CM

## 2013-10-09 DIAGNOSIS — M25522 Pain in left elbow: Secondary | ICD-10-CM

## 2013-10-09 DIAGNOSIS — M25529 Pain in unspecified elbow: Secondary | ICD-10-CM

## 2013-10-09 NOTE — Progress Notes (Signed)
VASCULAR LAB PRELIMINARY  PRELIMINARY  PRELIMINARY  PRELIMINARY  Left upper extremity venous Doppler completed.    Preliminary report:  There is no DVT or SVT noted in the left upper extremity.   Ayat Drenning, RVT 10/09/2013, 5:32 PM

## 2013-10-09 NOTE — Patient Instructions (Signed)
GO TO [facility]  REGISTER AS OUT PATIENT FOR DOPPLER

## 2013-10-09 NOTE — Progress Notes (Addendum)
Subjective:    Patient ID: Tracy Larsen, female    DOB: 02/03/1960, 53 y.o.   MRN: [id number] This chart was scribed for Wendie Agreste, MD by Martinique Peace, ED Scribe. The patient was seen in [facility]. The patient's care was started at 3:48 PM.   HPI HPI Comments: Tracy Larsen is a 53 y.o. female who presents to the [facility] complaining of radiating left arm pain onset 2 weeks ago. Pt states that she was previously in hospital for coleitis and had to receive fluids in her arm. Pt reports that the nurse applying her IV in her left arm was very rough throughout the process and described it as being very painful. Pt adds that it feels as if she has a blood pressure cuff on her arm. She denies any chest pain or SOB. Reports some numbness and tingling of problematic area. She expresses concern about possible blood clot. Daughter has Factor V Leiden. Pt is right hand dominant. Pt is former smoker.    Patient Active Problem List   Diagnosis Date Noted  . IBS (irritable bowel syndrome) 04/22/2013  . Mitral valve prolapse 09/24/2012  . TIA (transient ischemic attack) 09/24/2012   Past Medical History  Diagnosis Date  . Herpes   . Endometriosis   . Mitral valve prolapse   . GERD (gastroesophageal reflux disease)   . IBS (irritable bowel syndrome)   . HSV (herpes simplex virus) infection   . Insomnia   . Asthma   . Allergy   . Arthritis    Past Surgical History  Procedure Laterality Date  . Bladder repair      AGE 74  . Pelvic laparoscopy      X 5  . Urethral stricture dilatation    . Nasal sinus surgery     Allergies  Allergen Reactions  . Augmentin [Amoxicillin-Pot Clavulanate] Shortness Of Breath  . Avelox [Moxifloxacin Hcl In Nacl] Shortness Of Breath and Palpitations  . Levofloxacin Shortness Of Breath and Palpitations  . Entex Other (See Comments)    Tongue peeling   . Erythromycin Diarrhea  . Promethazine Other (See Comments)    Made butt feel like it was on fire --  Suppository    . Zofran [Ondansetron Hcl] Nausea Only and Other (See Comments)    Makes nausea much worse and dizziness   . Sulfa Antibiotics Rash  . Tetracyclines & Related Rash   Prior to Admission medications   Medication Sig Start Date End Date Taking? Authorizing Provider  ibuprofen (ADVIL,MOTRIN) 200 MG tablet Take 400 mg by mouth every 6 (six) hours as needed for moderate pain.   Yes Historical Provider, MD  loratadine (CLARITIN) 10 MG tablet Take 10 mg by mouth daily.   Yes Historical Provider, MD  acyclovir (ZOVIRAX) 400 MG tablet Take 400 mg by mouth 2 (two) times daily. Uses as needed 03/11/11   Anastasio Auerbach, MD  metoCLOPramide (REGLAN) 5 MG tablet Take 1 tablet (5 mg total) by mouth every 8 (eight) hours as needed for nausea. 09/28/13   Pamella Pert, MD  Mupirocin 2 % KIT Place 1 application into both nostrils daily as needed (for nose pain).    Historical Provider, MD  oxyCODONE-acetaminophen (PERCOCET) 5-325 MG per tablet Take 1 tablet by mouth every 6 (six) hours as needed. 09/28/13   Pamella Pert, MD   History   Social History  . Marital Status: Married    Spouse Name: N/A    Number of Children: 1  .  Years of Education: N/A   Occupational History  . [employer]    Social History Main Topics  . Smoking status: Former Research scientist (life sciences)  . Smokeless tobacco: Not on file  . Alcohol Use: Yes     Comment: Occasional  . Drug Use: No  . Sexual Activity: Yes    Birth Control/ Protection: None   Other Topics Concern  . Not on file   Social History Narrative  . No narrative on file      Review of Systems  Respiratory: Negative for shortness of breath.   Cardiovascular: Negative for chest pain.  Skin:       Healing bruise on left arm.   Neurological: Positive for numbness (left arm).       Objective:   Physical Exam  Vitals reviewed. Constitutional: She is oriented to person, place, and time. She appears well-developed and well-nourished.  HENT:    Head: Normocephalic and atraumatic.  Eyes: Conjunctivae and EOM are normal. Pupils are equal, round, and reactive to light.  Neck: Carotid bruit is not present.  Cardiovascular: Normal rate, regular rhythm, normal heart sounds and intact distal pulses.   Pulmonary/Chest: Effort normal and breath sounds normal.  Abdominal: Soft. She exhibits no pulsatile midline mass. There is no tenderness.  Musculoskeletal:  TTP over antecubital fossa. Circumference of arm at 15 cm above upper olecranon is 34 cm on left and 33 cm on right. Tenderness diffusely left upper arm. No bony tenderness. Hand veins flatten with hands over head bilaterally.   Neurological: She is alert and oriented to person, place, and time.  Skin: Skin is warm and dry. No rash noted.  No redness.   Psychiatric: She has a normal mood and affect. Her behavior is normal.      Filed Vitals:   10/09/13 1412  BP: 108/74  Pulse: 76  Temp: 97.6 F (36.4 C)  TempSrc: Oral  Resp: 16  Height: _0  (1.702 m)  Weight: 186 lb (84.369 kg)  SpO2: 98%       Assessment & Plan:   Tracy Larsen is a 53 y.o. female Pain in joint, upper arm, left - Plan: Upper extremity Venous Duplex Left  Left arm swelling - Plan: Upper extremity Venous Duplex Left  Family history of DVT - Plan: Upper extremity Venous Duplex Left  Superficial thrombophlebitis of arm - Plan: Upper extremity Venous Duplex Left  Prior IV in affected area 2 weeks ago. Suspected superficial thrombophlebitis. Now with proximal arm pain. No rash, or apparent cellulitis.  dtr with factor V leiden and DVT.  Will check Doppler to R/O upper extremity DVT, but this would be more rare.   Patient Instructions  GO TO [facility]  REGISTER AS OUT PATIENT FOR DOPPLER  Addendum:  Doppler report received - negative: Summary: No evidence of deep vein or superficial thrombosis involving the left upper extremity and right subclavian vein.  advised patient via U/S tech  to apply heat, take ibuprofen po - up to 654m Q6 hr prn with food discussed in office, and recheck in next week if not improving - sooner if worse.

## 2013-11-11 ENCOUNTER — Telehealth: Payer: Self-pay

## 2013-11-11 DIAGNOSIS — Z1211 Encounter for screening for malignant neoplasm of colon: Secondary | ICD-10-CM

## 2013-11-11 NOTE — Telephone Encounter (Signed)
Pt called to see if we could give her a referral to a GI specialist because she is interested in getting a colonoscopy. She is wanting to know if she needs to come back in to be seen in order to have Dr. Chilton SiGreen write a referral. I told her that is likely but we would like it to the clinical staff to decide.

## 2013-11-12 NOTE — Telephone Encounter (Signed)
Was she not referred to GI after her evaluation in the ER for colitis (noted on abdominal CT in September)? Per ER notes, plan was to follow up with GI.  Can refer to her to any GI for follow up colitis if needed, then they can discuss colonoscopy form there.

## 2013-11-14 NOTE — Telephone Encounter (Signed)
LM for pt to rtn call to see if she has a preference as to where she is referred to.  Entered in referral to GI- ED did not put in referral so pt did not follow up with one.

## 2013-11-15 NOTE — Telephone Encounter (Signed)
Pt does not have a preference- she has not been referred to GI before.

## 2013-12-15 ENCOUNTER — Telehealth: Payer: Self-pay | Admitting: Cardiology

## 2013-12-15 ENCOUNTER — Ambulatory Visit (INDEPENDENT_AMBULATORY_CARE_PROVIDER_SITE_OTHER): Payer: 59 | Admitting: Internal Medicine

## 2013-12-15 ENCOUNTER — Ambulatory Visit (INDEPENDENT_AMBULATORY_CARE_PROVIDER_SITE_OTHER): Payer: 59

## 2013-12-15 VITALS — BP 108/74 | HR 76 | Temp 97.8°F | Resp 16 | Ht 67.5 in | Wt 178.8 lb

## 2013-12-15 DIAGNOSIS — R079 Chest pain, unspecified: Secondary | ICD-10-CM

## 2013-12-15 DIAGNOSIS — R5383 Other fatigue: Secondary | ICD-10-CM

## 2013-12-15 DIAGNOSIS — R059 Cough, unspecified: Secondary | ICD-10-CM

## 2013-12-15 DIAGNOSIS — I341 Nonrheumatic mitral (valve) prolapse: Secondary | ICD-10-CM

## 2013-12-15 DIAGNOSIS — R05 Cough: Secondary | ICD-10-CM

## 2013-12-15 LAB — CBC WITH DIFFERENTIAL/PLATELET
Basophils Absolute: 0 10*3/uL (ref 0.0–0.1)
Basophils Relative: 1 % (ref 0–1)
Eosinophils Absolute: 0.1 10*3/uL (ref 0.0–0.7)
Eosinophils Relative: 3 % (ref 0–5)
HCT: 40.1 % (ref 36.0–46.0)
Hemoglobin: 13.6 g/dL (ref 12.0–15.0)
LYMPHS PCT: 38 % (ref 12–46)
Lymphs Abs: 1.8 10*3/uL (ref 0.7–4.0)
MCH: 28.7 pg (ref 26.0–34.0)
MCHC: 33.9 g/dL (ref 30.0–36.0)
MCV: 84.6 fL (ref 78.0–100.0)
MONO ABS: 0.4 10*3/uL (ref 0.1–1.0)
MPV: 9.6 fL (ref 9.4–12.4)
Monocytes Relative: 8 % (ref 3–12)
Neutro Abs: 2.4 10*3/uL (ref 1.7–7.7)
Neutrophils Relative %: 50 % (ref 43–77)
Platelets: 241 10*3/uL (ref 150–400)
RBC: 4.74 MIL/uL (ref 3.87–5.11)
RDW: 12.8 % (ref 11.5–15.5)
WBC: 4.8 10*3/uL (ref 4.0–10.5)

## 2013-12-15 NOTE — Progress Notes (Signed)
Subjective:    Patient ID: Tracy Larsen, female    DOB: 07/26/1960, 53 y.o.   MRN: [id number]  HPI Pt presents to clinic with 5 day h/o feeling poorly with myalgias and headaches.  Last week she had a few episodes of chest pressure and she thought she might be getting bronchitis but she never developed a cough and the chest pressure has continued.  When it happens she is not SOB and does not have nausea and the pain does not radiate.  Since her cold symptoms have started the pressure has not changed in her chest.  Total she has had about 4 episodes.  She has a cardiologist whom she saw about a year ago who wanted to do some tests but she never did any of them.  She started a Zpack 3 days ago and that makes her feel bad.  She wanted to go to work today but she felt to bad.  She also states that she has had a bad rotten egg smell coming out of her right nostril for about 2 months - she started using Bactroban and the smell has gone away.  She has h/o sinus surgery and she has had a nasal drip since and it has not changed since her surgery.  She is having slight blood from her nasal passageways but that is on the left side and she feels like that is from blowing her nose due to her allergies.  Family cardiac history - Brother WPW, brother age 52 cardiac arrest, dad age 52 MI   Review of Systems  Constitutional: Negative for fever and chills.  HENT: Positive for congestion. Rhinorrhea: no recent change.   Respiratory: Positive for cough (mild).        Objective:   Physical Exam  Constitutional: She is oriented to person, place, and time. She appears well-developed and well-nourished.  BP 108/74 mmHg  Pulse 76  Temp(Src) 97.8 F (36.6 C) (Oral)  Resp 16  Ht 5' 7.5" (1.715 m)  Wt 178 lb 12.8 oz (81.103 kg)  BMI 27.57 kg/m2  SpO2 100%  LMP 09/27/2011   HENT:  Head: Normocephalic and atraumatic.  Right Ear: External ear normal.  Left Ear: External ear normal.  Eyes: Conjunctivae  are normal.  Neck: Normal range of motion.  Cardiovascular: Regular rhythm and normal heart sounds.  Bradycardia present.   No murmur heard. No click audible  Pulmonary/Chest: Effort normal.  Lymphadenopathy:    She has no cervical adenopathy.  Neurological: She is alert and oriented to person, place, and time.  Skin: Skin is warm and dry.  Psychiatric: She has a normal mood and affect. Her behavior is normal. Judgment and thought content normal.   [facility] reading (PRIMARY) by  Dr. Perrin MalteseGuest.  NAD  EKG - Slight Bradycardia      Assessment & Plan:  Chest pain, unspecified chest pain type - Plan: DG Chest 2 View, EKG 12-Lead  Cough - Plan: CBC with Differential  URI - Plan: CBC with Differential - pt will continue abx and mucinex.  She will rest and push fluids.  Mitral valve prolapse - she has h/o this but her last cardiologist stated he did not believe she had this but she did not go for the ordered imaging studies.  Due to her chest pressure h/o MVP she will call her cardiologist for an appt for evaluation.  D/w Dr Perrin MalteseGuest.  Benny LennertSarah Weber PA-C  [facility] 12/15/2013 9:02  PM   

## 2013-12-15 NOTE — Progress Notes (Deleted)
   Subjective:    Patient ID: Tracy Larsen, female    DOB: 10/05/1960, 53 y.o.   MRN: [id number]  HPI    Review of Systems     Objective:   Physical Exam    [facility] reading (PRIMARY) by  Dr. Perrin MalteseGuest. Neg      Assessment & Plan:

## 2013-12-15 NOTE — Telephone Encounter (Signed)
Spoke with pt, EKG'S reviewed by dr Jens Somcrenshaw, they are similar with no changes. Patient was encouraged to make an appt if she has concerns and cont to have problems.

## 2013-12-15 NOTE — Patient Instructions (Signed)
Push fluids. Rest Continue and finish abx.  If the chest pain continues you need to see cardiology.  With the smell in your nose due to history of past surgeries please call and make an appt for evaluation.  I will contact you with your lab results as soon as they are available.   If you have not heard from me in 2 weeks, please contact me.  The fastest way to get your results is to register for My Chart (see the instructions on the last page of this printout).

## 2013-12-15 NOTE — Telephone Encounter (Signed)
New message     Patient calling had chest pain since last week. Has a viral infection on antibiotics. Chest pain started prior.    Went to urgent care this am EKG was done. Short P-R wave . Can the comparison from last year be look at.

## 2013-12-16 ENCOUNTER — Telehealth: Payer: Self-pay

## 2013-12-16 NOTE — Telephone Encounter (Signed)
Good advice.  She blood work was normal.

## 2013-12-16 NOTE — Telephone Encounter (Signed)
Pt requesting to speak with Tracy Larsen only, had an Ekg done that had some abnormalities: confirmed that this was there last August at Cardiologist. Pt is on a Zpack, having difficulty taking deep breaths, she does not want to take another pill. Also, never heard back on her blood work,. Please contact pt [phone]

## 2013-12-16 NOTE — Telephone Encounter (Signed)
Pt states she gets winded, low pulse has difficulty catching her breath a few hours after taking the medication. She is not having chest pain. She feels weak and sick a few hours after taking the antibiotics. Overall she is feeling much better. Pt is going to stop taking medication. She is going to treat symptoms with OTC medication. She will monitor symptoms and will RTC if she begins to get worse. She will be back in on Sunday to see Tracy Larsen if she is not better by then.

## 2013-12-31 ENCOUNTER — Ambulatory Visit (INDEPENDENT_AMBULATORY_CARE_PROVIDER_SITE_OTHER): Payer: 59 | Admitting: Family Medicine

## 2013-12-31 VITALS — BP 112/64 | HR 67 | Temp 97.9°F | Resp 18 | Ht 67.0 in | Wt 180.6 lb

## 2013-12-31 DIAGNOSIS — R042 Hemoptysis: Secondary | ICD-10-CM

## 2013-12-31 DIAGNOSIS — J029 Acute pharyngitis, unspecified: Secondary | ICD-10-CM

## 2013-12-31 DIAGNOSIS — R9431 Abnormal electrocardiogram [ECG] [EKG]: Secondary | ICD-10-CM

## 2013-12-31 DIAGNOSIS — R001 Bradycardia, unspecified: Secondary | ICD-10-CM

## 2013-12-31 DIAGNOSIS — J028 Acute pharyngitis due to other specified organisms: Secondary | ICD-10-CM

## 2013-12-31 DIAGNOSIS — R05 Cough: Secondary | ICD-10-CM

## 2013-12-31 DIAGNOSIS — R059 Cough, unspecified: Secondary | ICD-10-CM

## 2013-12-31 LAB — POCT CBC
GRANULOCYTE PERCENT: 47.2 % (ref 37–80)
HEMATOCRIT: 39.6 % (ref 37.7–47.9)
Hemoglobin: 13 g/dL (ref 12.2–16.2)
Lymph, poc: 1.5 (ref 0.6–3.4)
MCH, POC: 28.6 pg (ref 27–31.2)
MCHC: 32.9 g/dL (ref 31.8–35.4)
MCV: 87.1 fL (ref 80–97)
MID (cbc): 0.4 (ref 0–0.9)
MPV: 7.3 fL (ref 0–99.8)
PLATELET COUNT, POC: 211 10*3/uL (ref 142–424)
POC Granulocyte: 1.7 — AB (ref 2–6.9)
POC LYMPH PERCENT: 41.1 %L (ref 10–50)
POC MID %: 11.7 %M (ref 0–12)
RBC: 4.54 M/uL (ref 4.04–5.48)
RDW, POC: 12.9 %
WBC: 3.7 10*3/uL — AB (ref 4.6–10.2)

## 2013-12-31 LAB — BASIC METABOLIC PANEL
BUN: 16 mg/dL (ref 6–23)
CALCIUM: 9 mg/dL (ref 8.4–10.5)
CO2: 27 meq/L (ref 19–32)
Chloride: 103 mEq/L (ref 96–112)
Creat: 0.8 mg/dL (ref 0.50–1.10)
Glucose, Bld: 94 mg/dL (ref 70–99)
Potassium: 4.6 mEq/L (ref 3.5–5.3)
Sodium: 135 mEq/L (ref 135–145)

## 2013-12-31 LAB — TSH: TSH: 1.004 u[IU]/mL (ref 0.350–4.500)

## 2013-12-31 LAB — POCT RAPID STREP A (OFFICE): RAPID STREP A SCREEN: NEGATIVE

## 2013-12-31 MED ORDER — TRAMADOL HCL 50 MG PO TABS: 50.0000 mg | ORAL_TABLET | Freq: Three times a day (TID) | ORAL | Status: DC | PRN

## 2013-12-31 NOTE — Patient Instructions (Addendum)
Your strep test was negative, blood count reassuring.  It does not appear that you have a bacterial illness today, so would hold off on further antibiotics for right now, but will refer you to Ear Nose and throat (Dr. Annalee GentaShoemaker) as discussed. You may have had the virus that causes hand, foot and mouth infection. I also recommend following up with Dr. Ludwig Clarksrenshaw's office this week - if you need us to refer you, let me know, but as you are already a patient there - should be able to call for an appointment. Saline nasal spray ok. For sore throat - you can continue lozenges, salt water swishes, over the counter tylenol or ibuprofen. And if needed for more severe pain - tramadol.    If rash on face recurs - recehck and I can either swab that area, or can evaluate with dermatology if needed.   Return to the clinic or go to the nearest emergency room if any of your symptoms worsen or new symptoms occur.  Sore Throat A sore throat is pain, burning, irritation, or scratchiness of the throat. There is often pain or tenderness when swallowing or talking. A sore throat may be accompanied by other symptoms, such as coughing, sneezing, fever, and swollen neck glands. A sore throat is often the first sign of another sickness, such as a cold, flu, strep throat, or mononucleosis (commonly known as mono). Most sore throats go away without medical treatment. CAUSES  The most common causes of a sore throat include:  A viral infection, such as a cold, flu, or mono.  A bacterial infection, such as strep throat, tonsillitis, or whooping cough.  Seasonal allergies.  Dryness in the air.  Irritants, such as smoke or pollution.  Gastroesophageal reflux disease (GERD). HOME CARE INSTRUCTIONS   Only take over-the-counter medicines as directed by your caregiver.  Drink enough fluids to keep your urine clear or pale yellow.  Rest as needed.  Try using throat sprays, lozenges, or sucking on hard candy to ease any pain  (if older than 4 years or as directed).  Sip warm liquids, such as broth, herbal tea, or warm water with honey to relieve pain temporarily. You may also eat or drink cold or frozen liquids such as frozen ice pops.  Gargle with salt water (mix 1 tsp salt with 8 oz of water).  Do not smoke and avoid secondhand smoke.  Put a cool-mist humidifier in your bedroom at night to moisten the air. You can also turn on a hot shower and sit in the bathroom with the door closed for 5-10 minutes. SEEK IMMEDIATE MEDICAL CARE IF:  You have difficulty breathing.  You are unable to swallow fluids, soft foods, or your saliva.  You have increased swelling in the throat.  Your sore throat does not get better in 7 days.  You have nausea and vomiting.  You have a fever or persistent symptoms for more than 2-3 days.  You have a fever and your symptoms suddenly get worse. MAKE SURE YOU:   Understand these instructions.  Will watch your condition.  Will get help right away if you are not doing well or get worse. Document Released: 02/21/2004 Document Revised: 12/31/2011 Document Reviewed: 09/21/2011 [facility]ExitCare Patient Information 2015 [employer] [place]. This information is not intended to replace advice given to you by your health care provider. Make sure you discuss any questions you have with your health care provider.

## 2013-12-31 NOTE — Progress Notes (Signed)
Subjective:   This chart was scribed for Tracy Ray, MD by Forrestine Him, [facility] Scribe. This patient was seen in room 5 and the patient's care was started 9:58 AM.    Patient ID: Tracy Larsen, female    DOB: 08/31/1960, 53 y.o.   MRN: [id number]  Chief Complaint  Patient presents with  . Follow-up    patient was here previously, symptoms are not improving, started again after she was here on the 19th, sore throat and blisters, coughing up blood with yellow/green tinge in the am  . Rash    left side of forehead  . Fever    HPI  HPI Comments: [name] is a 53 y.o. female with a PMHx of genital herpes and GERD who presents to [facility] here for follow up today.  Pt was seen 11/19 for myalgias, HA, and chest pressure. Pt took a few doses of her husbands left over Z-Pak 3 days prior for possible bronchitis. Pt states temperature was 101 prior to OV. She was continued on antibiotics for cough, URI, and fluids at last visit. She was also encouraged for follow up with cardiology. CXR report on 11/19 had no acute abnormalities. Note from cardiology states EKG in our office which was similar with no changes from prior but not seen by cardiologist. See telepohone note, on 11/20 with dyspnea and plan to stop on antibiotic as thought to be associated with dyspnea.  Pt now reports constant, ongoing blisters to her throat, chills, postnasal drip, heaviness to the chest, and sore throat onset few weeks. Pt states symptoms improved mildly after starting antibiotic. However, symptoms have worsened again. Pt also admits to hemoptysis only in the morning. She mentions an intermittent pruritis rash to the L face x 2-3 days. She has tried OCT Mucinex without any improvement for any of above symptoms. No recent chest pain or palpitations. Grandson had hand, foot, and mouth disease at the same time her symptoms started. She is a former smoker; quit over  10 years ago. Pt states she smoked about half a pack of cigarettes daily.  [name] underwent sinus surgery with artifical implant to R nare. In addition, she reports a sore odor to same nostril which is intermittent.  She is followed by Dr. Tami Lin, however, pt is seen by other providers at [facility] when Dr. Laney Pastor is not available.  Patient Active Problem List   Diagnosis Date Noted  . IBS (irritable bowel syndrome) 04/22/2013  . Mitral valve prolapse 09/24/2012  . TIA (transient ischemic attack) 09/24/2012   Past Medical History  Diagnosis Date  . Herpes   . Endometriosis   . Mitral valve prolapse   . GERD (gastroesophageal reflux disease)   . IBS (irritable bowel syndrome)   . HSV (herpes simplex virus) infection   . Insomnia   . Asthma   . Allergy   . Arthritis    Past Surgical History  Procedure Laterality Date  . Bladder repair      AGE 58  . Pelvic laparoscopy      X 5  . Urethral stricture dilatation    . Nasal sinus surgery     Allergies  Allergen Reactions  . Augmentin [Amoxicillin-Pot Clavulanate] Shortness Of Breath  . Avelox [Moxifloxacin Hcl In Nacl] Shortness Of Breath and Palpitations  . Levofloxacin Shortness Of Breath and Palpitations  . Entex Other (See Comments)    Tongue peeling   . Erythromycin  Diarrhea  . Promethazine Other (See Comments)    Made butt feel like it was on fire -- Suppository    . Zofran [Ondansetron Hcl] Nausea Only and Other (See Comments)    Makes nausea much worse and dizziness   . Sulfa Antibiotics Rash  . Tetracyclines & Related Rash   Prior to Admission medications   Medication Sig Start Date End Date Taking? Authorizing Provider  acyclovir (ZOVIRAX) 400 MG tablet Take 400 mg by mouth 2 (two) times daily. Uses as needed 03/11/11  Yes Anastasio Auerbach, MD  ibuprofen (ADVIL,MOTRIN) 200 MG tablet Take 400 mg by mouth every 6 (six) hours as needed for moderate pain.   Yes Historical Provider, MD  loratadine  (CLARITIN) 10 MG tablet Take 10 mg by mouth daily.   Yes Historical Provider, MD  azithromycin (ZITHROMAX) 250 MG tablet Take by mouth daily.    Historical Provider, MD  metoCLOPramide (REGLAN) 5 MG tablet Take 1 tablet (5 mg total) by mouth every 8 (eight) hours as needed for nausea. Patient not taking: Reported on 12/31/2013 09/28/13   Pamella Pert, MD  Mupirocin 2 % KIT Place 1 application into both nostrils daily as needed (for nose pain).    Historical Provider, MD  oxyCODONE-acetaminophen (PERCOCET) 5-325 MG per tablet Take 1 tablet by mouth every 6 (six) hours as needed. Patient not taking: Reported on 12/31/2013 09/28/13   Pamella Pert, MD   History   Social History  . Marital Status: Married    Spouse Name: N/A    Number of Children: 1  . Years of Education: N/A   Occupational History  . [employer]    Social History Main Topics  . Smoking status: Former Research scientist (life sciences)  . Smokeless tobacco: Not on file  . Alcohol Use: Yes     Comment: Occasional  . Drug Use: No  . Sexual Activity: Yes    Birth Control/ Protection: None   Other Topics Concern  . Not on file   Social History Narrative     Review of Systems  Constitutional: Positive for chills. Negative for fever.  HENT: Positive for postnasal drip and sore throat.   Respiratory: Negative for chest tightness.   Cardiovascular: Negative for chest pain and palpitations.  Skin: Positive for rash.      Objective:  Physical Exam  Constitutional: She is oriented to person, place, and time. She appears well-developed and well-nourished. No distress.  HENT:  Head: Normocephalic and atraumatic.  Right Ear: Hearing, tympanic membrane, external ear and ear canal normal.  Left Ear: Hearing, tympanic membrane, external ear and ear canal normal.  Nose: Nose normal.  Mouth/Throat: Posterior oropharyngeal erythema present. No oropharyngeal exudate.  Slight cobble stoning but no vesicles   Eyes: Conjunctivae and EOM are  normal. Pupils are equal, round, and reactive to light.  Cardiovascular: Normal rate, regular rhythm, normal heart sounds and intact distal pulses.   No murmur heard. Pulmonary/Chest: Effort normal and breath sounds normal. No respiratory distress. She has no wheezes. She has no rhonchi.  Musculoskeletal:  No lower extremity edema  Lymphadenopathy:    She has no cervical adenopathy.  Neurological: She is alert and oriented to person, place, and time.  Skin: Skin is warm and dry. No rash noted.  Psychiatric: She has a normal mood and affect. Her behavior is normal.  Vitals reviewed.  EKG in office today: Sinus bradycardia with a rate of 48. PR interval 110. Compared to EKG on 11/19, PR interval slightly shorter  from 132. Other wise no apparent change.  Filed Vitals:   12/31/13 0844  BP: 112/64  Pulse: 67  Temp: 97.9 F (36.6 C)  TempSrc: Oral  Resp: 18  Height: 5' 7"  (1.702 m)  Weight: 180 lb 9.6 oz (81.92 kg)  SpO2: 100%    Results for orders placed or performed in visit on 12/31/13  POCT CBC  Result Value Ref Range   WBC 3.7 (A) 4.6 - 10.2 K/uL   Lymph, poc 1.5 0.6 - 3.4   POC LYMPH PERCENT 41.1 10 - 50 %L   MID (cbc) 0.4 0 - 0.9   POC MID % 11.7 0 - 12 %M   POC Granulocyte 1.7 (A) 2 - 6.9   Granulocyte percent 47.2 37 - 80 %G   RBC 4.54 4.04 - 5.48 M/uL   Hemoglobin 13.0 12.2 - 16.2 g/dL   HCT, POC 39.6 37.7 - 47.9 %   MCV 87.1 80 - 97 fL   MCH, POC 28.6 27 - 31.2 pg   MCHC 32.9 31.8 - 35.4 g/dL   RDW, POC 12.9 %   Platelet Count, POC 211 142 - 424 K/uL   MPV 7.3 0 - 99.8 fL  POCT rapid strep A  Result Value Ref Range   Rapid Strep A Screen Negative Negative    Assessment & Plan:   Tracy Larsen is a 53 y.o. female Acute pharyngitis due to other specified organisms - Plan: POCT CBC, POCT rapid strep A, Basic metabolic panel, TSH, Herpes simplex virus culture, EKG 12-Lead, Ambulatory referral to ENT, Culture, Group A Strep, Sore throat - Plan: traMADol  (ULTRAM) 50 MG tablet  -no vesicles seen on exam in office, but history and possible sick contact with Coxsackie virus likely cause of initial illness.   -throat cx sent, viral swab obtained  -refer to ENT for eval as persistent pain in throat, morning hemoptysis, and with prior reported nasal surgery with implant and symptoms of intermittent odor affected nostril.  Cough - Plan: POCT CBC, POCT rapid strep A, Basic metabolic panel, TSH, Herpes simplex virus culture, EKG 12-Lead, Ambulatory referral to ENT  -ok to cont mucinex, reassuring exam and cbc. Post infectious cough possible. rtc precautions discussed.   Bradycardia, shortened PR interval - Plan: POCT CBC, POCT rapid strep A, Basic metabolic panel, TSH, Herpes simplex virus culture, EKG 12-Lead  - discussed need for follow up with cardiologist as planned.   Hemoptysis - Plan: POCT CBC, POCT rapid strep A, Basic metabolic panel, TSH, Herpes simplex virus culture, EKG 12-Lead,  -Ambulatory referral to ENT as above. If no source found with their eval, and hemoptysis sx's persist/cough persist - may need pulmonary eval. rtc precautions.    Meds ordered this encounter  Medications  . traMADol (ULTRAM) 50 MG tablet    Sig: Take 1 tablet (50 mg total) by mouth every 8 (eight) hours as needed.    Dispense:  30 tablet    Refill:  0   Patient Instructions  Your strep test was negative, blood count reassuring.  It does not appear that you have a bacterial illness today, so would hold off on further antibiotics for right now, but will refer you to Ear Nose and throat (Dr. Wilburn Cornelia) as discussed. You may have had the virus that causes hand, foot and mouth infection. I also recommend following up with Dr. Jacalyn Lefevre office this week - if you need [place] to refer you, let me know, but as you are already a  patient there - should be able to call for an appointment. Saline nasal spray ok. For sore throat - you can continue lozenges, salt water swishes, over  the counter tylenol or ibuprofen. And if needed for more severe pain - tramadol.    If rash on face recurs - recehck and I can either swab that area, or can evaluate with dermatology if needed.   Return to the clinic or go to the nearest emergency room if any of your symptoms worsen or new symptoms occur.  Sore Throat A sore throat is pain, burning, irritation, or scratchiness of the throat. There is often pain or tenderness when swallowing or talking. A sore throat may be accompanied by other symptoms, such as coughing, sneezing, fever, and swollen neck glands. A sore throat is often the first sign of another sickness, such as a cold, flu, strep throat, or mononucleosis (commonly known as mono). Most sore throats go away without medical treatment. CAUSES  The most common causes of a sore throat include:  A viral infection, such as a cold, flu, or mono.  A bacterial infection, such as strep throat, tonsillitis, or whooping cough.  Seasonal allergies.  Dryness in the air.  Irritants, such as smoke or pollution.  Gastroesophageal reflux disease (GERD). HOME CARE INSTRUCTIONS   Only take over-the-counter medicines as directed by your caregiver.  Drink enough fluids to keep your urine clear or pale yellow.  Rest as needed.  Try using throat sprays, lozenges, or sucking on hard candy to ease any pain (if older than 4 years or as directed).  Sip warm liquids, such as broth, herbal tea, or warm water with honey to relieve pain temporarily. You may also eat or drink cold or frozen liquids such as frozen ice pops.  Gargle with salt water (mix 1 tsp salt with 8 oz of water).  Do not smoke and avoid secondhand smoke.  Put a cool-mist humidifier in your bedroom at night to moisten the air. You can also turn on a hot shower and sit in the bathroom with the door closed for 5-10 minutes. SEEK IMMEDIATE MEDICAL CARE IF:  You have difficulty breathing.  You are unable to swallow fluids,  soft foods, or your saliva.  You have increased swelling in the throat.  Your sore throat does not get better in 7 days.  You have nausea and vomiting.  You have a fever or persistent symptoms for more than 2-3 days.  You have a fever and your symptoms suddenly get worse. MAKE SURE YOU:   Understand these instructions.  Will watch your condition.  Will get help right away if you are not doing well or get worse. Document Released: 02/21/2004 Document Revised: 12/31/2011 Document Reviewed: 09/21/2011 [facility] Patient Information 2015 [place], [place]. This information is not intended to replace advice given to you by your health care provider. Make sure you discuss any questions you have with your health care provider.      I personally performed the services described in this documentation, which was scribed in my presence. The recorded information has been reviewed and is accurate.

## 2014-01-02 LAB — CULTURE, GROUP A STREP: ORGANISM ID, BACTERIA: NORMAL

## 2014-01-03 LAB — HERPES SIMPLEX VIRUS CULTURE: Organism ID, Bacteria: NOT DETECTED

## 2014-01-06 ENCOUNTER — Telehealth: Payer: Self-pay | Admitting: Radiology

## 2014-01-06 NOTE — Telephone Encounter (Signed)
Cbc is normal.

## 2014-01-06 NOTE — Telephone Encounter (Signed)
Pt is calling about labs. She was seen here 11/19 by Dr Perrin MalteseGuest and labs have not been reviewed yet. She was also seen 12/5 by dr Neva Seatgreene and those labs have not been reviewed yet either. Please review.

## 2014-01-06 NOTE — Telephone Encounter (Signed)
LM to advise pt lab was normal.

## 2014-01-12 ENCOUNTER — Other Ambulatory Visit: Payer: Self-pay | Admitting: Obstetrics and Gynecology

## 2014-01-16 LAB — CYTOLOGY - PAP

## 2014-06-30 ENCOUNTER — Encounter: Payer: Self-pay | Admitting: *Deleted

## 2014-10-04 ENCOUNTER — Other Ambulatory Visit (HOSPITAL_COMMUNITY): Payer: Self-pay | Admitting: Orthopedic Surgery

## 2014-10-04 DIAGNOSIS — M25511 Pain in right shoulder: Secondary | ICD-10-CM

## 2014-10-09 ENCOUNTER — Ambulatory Visit (HOSPITAL_COMMUNITY): Payer: Self-pay

## 2014-10-11 ENCOUNTER — Ambulatory Visit (HOSPITAL_COMMUNITY): Admission: RE | Admit: 2014-10-11 | Payer: 59 | Source: Ambulatory Visit

## 2014-11-13 ENCOUNTER — Ambulatory Visit (INDEPENDENT_AMBULATORY_CARE_PROVIDER_SITE_OTHER): Payer: 59 | Admitting: Family Medicine

## 2014-11-13 VITALS — BP 122/80 | HR 83 | Temp 97.7°F | Resp 16 | Ht 67.0 in | Wt 190.0 lb

## 2014-11-13 DIAGNOSIS — N10 Acute tubulo-interstitial nephritis: Secondary | ICD-10-CM

## 2014-11-13 DIAGNOSIS — R1011 Right upper quadrant pain: Secondary | ICD-10-CM

## 2014-11-13 DIAGNOSIS — R1031 Right lower quadrant pain: Secondary | ICD-10-CM

## 2014-11-13 LAB — POCT URINALYSIS DIP (MANUAL ENTRY)
BILIRUBIN UA: NEGATIVE
BILIRUBIN UA: NEGATIVE
Glucose, UA: NEGATIVE
Nitrite, UA: POSITIVE — AB
PH UA: 6.5
Protein Ur, POC: 30 — AB
Spec Grav, UA: 1.015
Urobilinogen, UA: 0.2

## 2014-11-13 LAB — POC MICROSCOPIC URINALYSIS (UMFC): Mucus: ABSENT

## 2014-11-13 LAB — POCT CBC
Granulocyte percent: 71.6 %G (ref 37–80)
HCT, POC: 35.8 % — AB (ref 37.7–47.9)
HEMOGLOBIN: 12.5 g/dL (ref 12.2–16.2)
LYMPH, POC: 1.9 (ref 0.6–3.4)
MCH, POC: 30.2 pg (ref 27–31.2)
MCHC: 34.8 g/dL (ref 31.8–35.4)
MCV: 86.6 fL (ref 80–97)
MID (cbc): 0.7 (ref 0–0.9)
MPV: 6.8 fL (ref 0–99.8)
PLATELET COUNT, POC: 228 10*3/uL (ref 142–424)
POC Granulocyte: 6.5 (ref 2–6.9)
POC LYMPH PERCENT: 20.9 %L (ref 10–50)
POC MID %: 7.5 % (ref 0–12)
RBC: 4.13 M/uL (ref 4.04–5.48)
RDW, POC: 13.7 %
WBC: 9.1 10*3/uL (ref 4.6–10.2)

## 2014-11-13 LAB — POCT URINE PREGNANCY: Preg Test, Ur: NEGATIVE

## 2014-11-13 MED ORDER — CIPROFLOXACIN HCL 500 MG PO TABS: 500.0000 mg | ORAL_TABLET | Freq: Two times a day (BID) | ORAL | Status: DC

## 2014-11-13 NOTE — Patient Instructions (Signed)
We are going to treat you for a presumed UTI today with cipro If you are not able to tolerate the 500 mg dose take just a 1/2 tablet for each dose I will be in touch with your urine culture asap If you do not feel better in the next day please come back and see us- to the ER if worse   Get some pyridium (azo is one brand name)- at the drug store for pain

## 2014-11-13 NOTE — Progress Notes (Addendum)
[facility] [address], [place] [id number] [phone]  Date:  11/13/2014   Name:  Tracy Larsen   DOB:  05/10/1960   MRN:  [id number]  PCP:  Tonye PearsonOLITTLE, ROBERT P, MD    Chief Complaint: Abdominal Pain and Shoulder Pain   History of Present Illness:  Tracy Larsen is a 54 y.o. very pleasant female patient who presents with the following:  Here today with complaint of illness.   She was on vacation recently and was ill with sinus congestion and cough. This seemed to be getting better.   Then this morning at 12:30 am she noted onset of right sided belly pain No diarrhea or vomiting She took ibuprofen and in about an hour she was feeling a bit better. However she then noted onset of dysuria.  She does have a history of bladder refulux She did have UTI type sx a few days ago- this seemed to pass, but she thinks it may be back and that she may have a UTI  Admits that she has also noted right upper abd post- prandial pain for some months, this is not acutely present but she wonders if she has gallstones.  It does not matter what she eats- she always gets the pain  She also has a history of IBS- never had a kidney stone She had a loose stool this am She ate an egg biscuit this am  She notes that her urine smells bad  She notes that she has trouble with her right shoulder- she thinks this is due to getting a flu and tetanus shot in the same arm. She [facility] for this issue- they are working on their plan for her shoulder. In any case she would like a letter excusing her from getting another flu shot which is required for her job.   She is able to take cipro- she is not allergic to this.   She has had "a feeling like I'm going (to pass) out" with avelox and levaquin but never had lip/ tongue swelling or hives.  She has always been able to take cipro without having these SE  Patient Active Problem List   Diagnosis Date Noted  .  IBS (irritable bowel syndrome) 04/22/2013  . Mitral valve prolapse 09/24/2012  . TIA (transient ischemic attack) 09/24/2012    Past Medical History  Diagnosis Date  . Herpes   . Endometriosis   . Mitral valve prolapse   . GERD (gastroesophageal reflux disease)   . IBS (irritable bowel syndrome)   . HSV (herpes simplex virus) infection   . Insomnia   . Asthma   . Allergy   . Arthritis     Past Surgical History  Procedure Laterality Date  . Bladder repair      AGE 2  . Pelvic laparoscopy      X 5  . Urethral stricture dilatation    . Nasal sinus surgery      Social History  Substance Use Topics  . Smoking status: Former Games developermoker  . Smokeless tobacco: Never Used  . Alcohol Use: 0.0 oz/week    0 Standard drinks or equivalent per week     Comment: Occasional    Family History  Problem Relation Age of Onset  . Cancer Mother     VULVAR CANCER  . Heart disease Father     MI in his 6760s  . Cancer Brother  SARCOMA  . Heart disease Brother     WPW  . Diabetes Paternal Grandmother     Allergies  Allergen Reactions  . Augmentin [Amoxicillin-Pot Clavulanate] Shortness Of Breath  . Avelox [Moxifloxacin Hcl In Nacl] Shortness Of Breath and Palpitations  . Levofloxacin Shortness Of Breath and Palpitations  . Entex Other (See Comments)    Tongue peeling   . Erythromycin Diarrhea  . Promethazine Other (See Comments)    Made butt feel like it was on fire -- Suppository    . Zofran [Ondansetron Hcl] Nausea Only and Other (See Comments)    Makes nausea much worse and dizziness   . Sulfa Antibiotics Rash  . Tetracyclines & Related Rash    Medication list has been reviewed and updated.  Current Outpatient Prescriptions on File Prior to Visit  Medication Sig Dispense Refill  . acyclovir (ZOVIRAX) 400 MG tablet Take 400 mg by mouth 2 (two) times daily. Uses as needed    . ibuprofen (ADVIL,MOTRIN) 200 MG tablet Take 400 mg by mouth every 6 (six) hours as needed for  moderate pain.     No current facility-administered medications on file prior to visit.    Review of Systems:  As per HPI- otherwise negative.   Physical Examination: Filed Vitals:   11/13/14 1149  BP: 122/80  Pulse: 83  Temp: 97.7 F (36.5 C)  Resp: 16   Filed Vitals:   11/13/14 1149  Height:  (1.702 m)  Weight: 190 lb (86.183 kg)   Body mass index is 29.75 kg/(m^2). Ideal Body Weight: Weight in (lb) to have BMI = 25: 159.3  GEN: WDWN, NAD, Non-toxic, A & O x 3, overweight, looks well HEENT: Atraumatic, Normocephalic. Neck supple. No masses, No LAD. Ears and Nose: No external deformity. CV: RRR, No M/G/R. No JVD. No thrill. No extra heart sounds. PULM: CTA B, no wheezes, crackles, rhonchi. No retractions. No resp. distress. No accessory muscle use. ABD: S,  ND, +BS. No rebound. No HSM.  She has right sided abd pain, more in the RLQ than the RUQ, and suprapubic.  Negative Murphy's  EXTR: No c/c/e NEURO Normal gait.  PSYCH: Normally interactive. Conversant. Not depressed or anxious appearing.  Calm demeanor.   Results for orders placed or performed in visit on 11/13/14  Urine culture  Result Value Ref Range   Culture ESCHERICHIA COLI    Colony Count >=100,000 COLONIES/ML    Organism ID, Bacteria ESCHERICHIA COLI       Susceptibility   Escherichia coli -  (no method available)    AMPICILLIN <=2 Sensitive     AMOX/CLAVULANIC <=2 Sensitive     AMPICILLIN/SULBACTAM <=2 Sensitive     PIP/TAZO <=4 Sensitive     IMIPENEM <=0.25 Sensitive     CEFAZOLIN <=4 Not Reportable     CEFTRIAXONE <=1 Sensitive     CEFTAZIDIME <=1 Sensitive     CEFEPIME <=1 Sensitive     GENTAMICIN <=1 Sensitive     TOBRAMYCIN <=1 Sensitive     CIPROFLOXACIN <=0.25 Sensitive     LEVOFLOXACIN <=0.12 Sensitive     NITROFURANTOIN <=16 Sensitive     TRIMETH/SULFA* <=20 Sensitive      * NR=NOT REPORTABLE,SEE COMMENTORAL therapy:A cefazolin MIC of <32 predicts susceptibility to the oral agents  cefaclor,cefdinir,cefpodoxime,cefprozil,cefuroxime,cephalexin,and loracarbef when used for therapy of uncomplicated UTIs due to E.coli,K.pneumomiae,and P.mirabilis. PARENTERAL therapy: A cefazolinMIC of >8 indicates resistance to parenteralcefazolin. An alternate test method must beperformed to confirm susceptibility to parenteralcefazolin.  POCT  urinalysis dipstick  Result Value Ref Range   Color, UA yellow yellow   Clarity, UA cloudy (A) clear   Glucose, UA negative negative   Bilirubin, UA negative negative   Ketones, POC UA negative negative   Spec Grav, UA 1.015    Blood, UA large (A) negative   pH, UA 6.5    Protein Ur, POC =30 (A) negative   Urobilinogen, UA 0.2    Nitrite, UA Positive (A) Negative   Leukocytes, UA large (3+) (A) Negative  POCT Microscopic Urinalysis ([facility])  Result Value Ref Range   WBC,UR,HPF,POC Many (A) None WBC/hpf   RBC,UR,HPF,POC Moderate (A) None RBC/hpf   Bacteria Many (A) None   Mucus Absent Absent   Epithelial Cells, UR Per Microscopy Few (A) None cells/hpf  POCT urine pregnancy  Result Value Ref Range   Preg Test, Ur Negative Negative  POCT CBC  Result Value Ref Range   WBC 9.1 4.6 - 10.2 K/uL   Lymph, poc 1.9 0.6 - 3.4   POC LYMPH PERCENT 20.9 10 - 50 %L   MID (cbc) 0.7 0 - 0.9   POC MID % 7.5 0 - 12 %M   POC Granulocyte 6.5 2 - 6.9   Granulocyte percent 71.6 37 - 80 %G   RBC 4.13 4.04 - 5.48 M/uL   Hemoglobin 12.5 12.2 - 16.2 g/dL   HCT, POC 16.1 (A) 09.6 - 47.9 %   MCV 86.6 80 - 97 fL   MCH, POC 30.2 27 - 31.2 pg   MCHC 34.8 31.8 - 35.4 g/dL   RDW, POC 04.5 %   Platelet Count, POC 228 142 - 424 K/uL   MPV 6.8 0 - 99.8 fL       Assessment and Plan: Right lower quadrant pain - Plan: POCT urinalysis dipstick, POCT Microscopic Urinalysis ([facility]), Urine culture, POCT urine pregnancy, POCT CBC, ciprofloxacin (CIPRO) 500 MG tablet, CANCELED: CBC  Acute pyelonephritis  Abdominal pain, right upper quadrant - Plan: US Abdomen Limited  RUQ  Here today with abdominal pain.  Her acute sx seem due to a UTI, but also wonder if she has gallstones. Will send for routine RUQ [place].  She has had an ex-lap 5 times so adhesions are also possible.  Discussed less likely possibility of appendicitis, ovarian cyst or acute cholecystitis.  Do not strongly suspect appendicitis or acute chole as her wbc is normal - however offered to send her for a CT and/ or [place] today, or to refer to ED.  At this time she really feels that she has a UTI- these sx are like when she had a UTI in the past.  She declines further imaging and would like to try treatment with close follow-up.  She is allergic or intolerant to most medications that might treat a UTI, but states that she is able to use cipro.  Sent this rx to her drug store and asked her to follow-up if not improved in 24 hours   Signed Abbe Amsterdam, MD  Send a copy of labs to Dr. Isabel Caprice when available   Called pt 10/20 and [facility]- she does have an E coli UTI sensitive to cipro. Please let me know if not feeling better.  Will send copy of labs to Dr. Isabel Caprice for her

## 2014-11-15 LAB — URINE CULTURE: Colony Count: >=100000

## 2014-12-25 ENCOUNTER — Encounter: Payer: Self-pay | Admitting: Internal Medicine

## 2015-01-25 ENCOUNTER — Ambulatory Visit (INDEPENDENT_AMBULATORY_CARE_PROVIDER_SITE_OTHER): Payer: 59 | Admitting: Family Medicine

## 2015-01-25 VITALS — BP 102/60 | HR 65 | Temp 97.5°F | Resp 16 | Ht 67.0 in | Wt 188.8 lb

## 2015-01-25 DIAGNOSIS — R300 Dysuria: Secondary | ICD-10-CM

## 2015-01-25 DIAGNOSIS — Z23 Encounter for immunization: Secondary | ICD-10-CM | POA: Diagnosis not present

## 2015-01-25 LAB — POCT URINALYSIS DIP (MANUAL ENTRY)
Bilirubin, UA: NEGATIVE
Glucose, UA: NEGATIVE
Ketones, POC UA: NEGATIVE
Leukocytes, UA: NEGATIVE
Nitrite, UA: NEGATIVE
PH UA: 5.5
PROTEIN UA: NEGATIVE
SPEC GRAV UA: 1.01
UROBILINOGEN UA: 0.2

## 2015-01-25 LAB — POC MICROSCOPIC URINALYSIS (UMFC): Mucus: ABSENT

## 2015-01-25 MED ORDER — CIPROFLOXACIN HCL 250 MG PO TABS: 250.0000 mg | ORAL_TABLET | Freq: Two times a day (BID) | ORAL | Status: DC

## 2015-01-25 NOTE — Patient Instructions (Signed)
It was good to see you today- I will be in touch with your labs asap (urine culture) Take the cipro twice a day for 5 days Let me know if you do not feel better!   I spoke with our clinical manager Doy Hutchingim Nichols, RN who told me that it would be ok to give you a flu shot in an alternate location due to your shoulder problems

## 2015-01-25 NOTE — Progress Notes (Addendum)
[facility] [address], [place] [address] [phone]  Date:  01/25/2015   Name:  Tracy Larsen   DOB:  Mar 03, 1960   MRN:  [id number]  PCP:  Tonye Pearson, MD    Chief Complaint: Urinary Tract Infection; Dysuria; and Immunizations   History of Present Illness:  Tracy Larsen is a 54 y.o. very pleasant female patient who presents with the following:  Here today to check on a possible UTI- she has noticed off and on burning with urination. She has noted this for a couple of weeks. No blood in her urine is visible.   No back pain, belly pain, fever,   Azo does help.  It is not as bad as when she was here in [place] was here in October with a UTI/pyelo with e coli.    She is still struggling with a right frozen shoulder- she had a shot of prednisone in the shoulder yesterday per Dr. Dion Saucier.  It seems that she got a tetanus and flu shot in the same arm last year and subsequently developed a frozen shoulder.  She is worried because she is a [employer] employee and is feeling a lot of pressure to get the flu shot again this year.  She is ok with getting this shot but would like to get somewhere not in her shoulder- has been told that this was not possible  Patient Active Problem List   Diagnosis Date Noted  . IBS (irritable bowel syndrome) 04/22/2013  . Mitral valve prolapse 09/24/2012  . TIA (transient ischemic attack) 09/24/2012    Past Medical History  Diagnosis Date  . Herpes   . Endometriosis   . Mitral valve prolapse   . GERD (gastroesophageal reflux disease)   . IBS (irritable bowel syndrome)   . HSV (herpes simplex virus) infection   . Insomnia   . Asthma   . Allergy   . Arthritis     Past Surgical History  Procedure Laterality Date  . Bladder repair      AGE 81  . Pelvic laparoscopy      X 5  . Urethral stricture dilatation    . Nasal sinus surgery      Social History  Substance Use Topics  . Smoking status: Former  Games developer  . Smokeless tobacco: Never Used  . Alcohol Use: 0.0 oz/week    0 Standard drinks or equivalent per week     Comment: Occasional    Family History  Problem Relation Age of Onset  . Cancer Mother     VULVAR CANCER  . Heart disease Father     MI in his 60s  . Cancer Brother     SARCOMA  . Heart disease Brother     WPW  . Diabetes Paternal Grandmother     Allergies  Allergen Reactions  . Augmentin [Amoxicillin-Pot Clavulanate] Shortness Of Breath  . Avelox [Moxifloxacin Hcl In Nacl] Shortness Of Breath and Palpitations  . Levofloxacin Shortness Of Breath and Palpitations  . Entex Other (See Comments)    Tongue peeling   . Erythromycin Diarrhea  . Promethazine Other (See Comments)    Made butt feel like it was on fire -- Suppository    . Zofran [Ondansetron Hcl] Nausea Only and Other (See Comments)    Makes nausea much worse and dizziness   . Sulfa Antibiotics Rash  . Tetracyclines & Related Rash    Medication list has been reviewed and updated.  Current Outpatient Prescriptions on File Prior to Visit  Medication Sig Dispense Refill  . acyclovir (ZOVIRAX) 400 MG tablet Take 400 mg by mouth 2 (two) times daily. Uses as needed    . estradiol (ESTRACE) 1 MG tablet Take 1 mg by mouth daily.    . Fluticasone Propionate (FLONASE NA) Place into the nose.    . ibuprofen (ADVIL,MOTRIN) 200 MG tablet Take 400 mg by mouth every 6 (six) hours as needed for moderate pain.    . medroxyPROGESTERone (PROVERA) 2.5 MG tablet Take 2.5 mg by mouth daily.    . ciprofloxacin (CIPRO) 500 MG tablet Take 1 tablet (500 mg total) by mouth 2 (two) times daily. (Patient not taking: Reported on 01/25/2015) 20 tablet 0  . meloxicam (MOBIC) 15 MG tablet Take 15 mg by mouth daily. Reported on 01/25/2015     No current facility-administered medications on file prior to visit.    Review of Systems:  As per HPI- otherwise negative.   Physical Examination: Filed Vitals:   01/25/15 0845  BP:  102/60  Pulse: 65  Temp: 97.5 F (36.4 C)  Resp: 16   Filed Vitals:   01/25/15 0845  Height:  (1.702 m)  Weight: 188 lb 12.8 oz (85.639 kg)   Body mass index is 29.56 kg/(m^2). Ideal Body Weight: Weight in (lb) to have BMI = 25: 159.3  GEN: WDWN, NAD, Non-toxic, A & O x 3, overweight, looks well HEENT: Atraumatic, Normocephalic. Neck supple. No masses, No LAD. Ears and Nose: No external deformity. CV: RRR, No M/G/R. No JVD. No thrill. No extra heart sounds. PULM: CTA B, no wheezes, crackles, rhonchi. No retractions. No resp. distress. No accessory muscle use. ABD: S, NT, ND. No rebound. No HSM.  Benign belly no CVA tenderness EXTR: No c/c/e NEURO Normal gait.  PSYCH: Normally interactive. Conversant. Not depressed or anxious appearing.  Calm demeanor.   Results for orders placed or performed in visit on 01/25/15  Urine culture  Result Value Ref Range   Colony Count NO GROWTH    Organism ID, Bacteria NO GROWTH   POCT urinalysis dipstick  Result Value Ref Range   Color, UA yellow yellow   Clarity, UA clear clear   Glucose, UA negative negative   Bilirubin, UA negative negative   Ketones, POC UA negative negative   Spec Grav, UA 1.010    Blood, UA moderate (A) negative   pH, UA 5.5    Protein Ur, POC negative negative   Urobilinogen, UA 0.2    Nitrite, UA Negative Negative   Leukocytes, UA Negative Negative  POCT Microscopic Urinalysis ([facility])  Result Value Ref Range   WBC,UR,HPF,POC None None WBC/hpf   RBC,UR,HPF,POC None None RBC/hpf   Bacteria Few (A) None, Too numerous to count   Mucus Absent Absent   Epithelial Cells, UR Per Microscopy Few (A) None, Too numerous to count cells/hpf    Assessment and Plan: Dysuria - Plan: POCT urinalysis dipstick, POCT Microscopic Urinalysis ([facility]), Urine culture, ciprofloxacin (CIPRO) 250 MG tablet  Immunization due  Possible UTI Will start on cipro (although she is allergic to other quinolones she has been able to take  cipro and this is what I gave her in October  Discussed with our RN clinical manager who felt that giving her a flu shot in her thigh would be acceptable.  She does not want to do this today but is grateful for the option Signed Abbe Amsterdam, MD  Received her urine culture- it  is negative as above.  Called on 01/28/15 and LMOM- culture is negative.  Take cipro for 3 days then stop.  If sx not resolved please let me know!

## 2015-01-26 LAB — URINE CULTURE
Colony Count: NO GROWTH
ORGANISM ID, BACTERIA: NO GROWTH

## 2015-02-12 ENCOUNTER — Ambulatory Visit (INDEPENDENT_AMBULATORY_CARE_PROVIDER_SITE_OTHER): Payer: 59

## 2015-02-12 ENCOUNTER — Ambulatory Visit (INDEPENDENT_AMBULATORY_CARE_PROVIDER_SITE_OTHER): Payer: 59 | Admitting: Family Medicine

## 2015-02-12 VITALS — BP 102/64 | HR 70 | Temp 98.2°F | Resp 16 | Ht 68.0 in | Wt 190.0 lb

## 2015-02-12 DIAGNOSIS — R05 Cough: Secondary | ICD-10-CM

## 2015-02-12 DIAGNOSIS — R0789 Other chest pain: Secondary | ICD-10-CM | POA: Diagnosis not present

## 2015-02-12 DIAGNOSIS — J208 Acute bronchitis due to other specified organisms: Secondary | ICD-10-CM

## 2015-02-12 DIAGNOSIS — R059 Cough, unspecified: Secondary | ICD-10-CM

## 2015-02-12 DIAGNOSIS — R5381 Other malaise: Secondary | ICD-10-CM | POA: Diagnosis not present

## 2015-02-12 LAB — POCT INFLUENZA A/B
INFLUENZA B, POC: NEGATIVE
Influenza A, POC: NEGATIVE

## 2015-02-12 LAB — TROPONIN I

## 2015-02-12 MED ORDER — ACETAMINOPHEN-CODEINE #3 300-30 MG PO TABS: 1.0000 | ORAL_TABLET | Freq: Three times a day (TID) | ORAL | Status: DC | PRN

## 2015-02-12 MED ORDER — AMOXICILLIN 875 MG PO TABS: 875.0000 mg | ORAL_TABLET | Freq: Two times a day (BID) | ORAL | Status: DC

## 2015-02-12 NOTE — Progress Notes (Signed)
[facility] [address] [place] [address] [phone]  Date:  02/12/2015   Name:  Tracy Larsen   DOB:  1960-04-26   MRN:  [id number]  PCP:  Tonye Pearson, MD    Chief Complaint: Cough; Chest Pain; Sore Throat; Generalized Body Aches; and Chills   History of Present Illness:  Tracy Larsen is a 55 y.o. very pleasant female patient who presents with the following:  Here today with illness for about 4 days.  Started with a tickle in her throat. Then developed a cough, she has noted CP since yesterday.  The CP is worse with cough and feels like a burning and rawness in her throat  She has noted chills but has not measured a fever She took dayquil last night.  Her nose feels very dry.   She is coughing up green material that tasted (but does not appear) bloody She has been exposed to illness at work Her last dose of dayquil was last night, tylenol this am.   No GI symptoms  She has noted some wheezing while in her sleep  She is s/p menopause History of pneumonia in the past - the pain in her chest reminds her of this time  She states that she took amox 875 recently and did not have any issues with it.  augmentin made her feel "clammy and like I was going to hit the floor" but did not cause any swelling or hives/ rash.  She has taken the amox in the recent past, she has a lot of allergy sx with many different abx  She does not have any cardiac history, but her brother did have WPW and now has a pacemaker She has seen cardiology in the past due to her bradyacardia but it has been a few years    Patient Active Problem List   Diagnosis Date Noted  . IBS (irritable bowel syndrome) 04/22/2013  . Mitral valve prolapse 09/24/2012  . TIA (transient ischemic attack) 09/24/2012    Past Medical History  Diagnosis Date  . Herpes   . Endometriosis   . Mitral valve prolapse   . GERD (gastroesophageal reflux disease)   . IBS (irritable bowel  syndrome)   . HSV (herpes simplex virus) infection   . Insomnia   . Asthma   . Allergy   . Arthritis     Past Surgical History  Procedure Laterality Date  . Bladder repair      AGE 59  . Pelvic laparoscopy      X 5  . Urethral stricture dilatation    . Nasal sinus surgery      Social History  Substance Use Topics  . Smoking status: Former Games developer  . Smokeless tobacco: Never Used  . Alcohol Use: 0.0 oz/week    0 Standard drinks or equivalent per week     Comment: Occasional    Family History  Problem Relation Age of Onset  . Cancer Mother     VULVAR CANCER  . Heart disease Father     MI in his 18s  . Cancer Brother     SARCOMA  . Heart disease Brother     WPW  . Diabetes Paternal Grandmother     Allergies  Allergen Reactions  . Augmentin [Amoxicillin-Pot Clavulanate] Shortness Of Breath  . Avelox [Moxifloxacin Hcl In Nacl] Shortness Of Breath and Palpitations  . Levofloxacin Shortness Of Breath and Palpitations  . Entex Other (See Comments)  Tongue peeling   . Erythromycin Diarrhea  . Promethazine Other (See Comments)    Made butt feel like it was on fire -- Suppository    . Zofran [Ondansetron Hcl] Nausea Only and Other (See Comments)    Makes nausea much worse and dizziness   . Sulfa Antibiotics Rash  . Tetracyclines & Related Rash    Medication list has been reviewed and updated.  Current Outpatient Prescriptions on File Prior to Visit  Medication Sig Dispense Refill  . acyclovir (ZOVIRAX) 400 MG tablet Take 400 mg by mouth 2 (two) times daily. Uses as needed    . estradiol (ESTRACE) 1 MG tablet Take 1 mg by mouth daily.    . Fluticasone Propionate (FLONASE NA) Place into the nose.    . ibuprofen (ADVIL,MOTRIN) 200 MG tablet Take 400 mg by mouth every 6 (six) hours as needed for moderate pain.    . medroxyPROGESTERone (PROVERA) 2.5 MG tablet Take 2.5 mg by mouth daily.    . ciprofloxacin (CIPRO) 250 MG tablet Take 1 tablet (250 mg total) by mouth 2  (two) times daily. (Patient not taking: Reported on 02/12/2015) 10 tablet 0  . meloxicam (MOBIC) 15 MG tablet Take 15 mg by mouth daily. Reported on 02/12/2015     No current facility-administered medications on file prior to visit.    Review of Systems:  As per HPI- otherwise negative.   Physical Examination: Filed Vitals:   02/12/15 0854  BP: 102/64  Pulse: 84  Temp: 98.2 F (36.8 C)  Resp: 16   Filed Vitals:   02/12/15 0854  Height: 5\' 8"  (1.727 m)  Weight: 190 lb (86.183 kg)   Body mass index is 28.9 kg/(m^2). Ideal Body Weight: Weight in (lb) to have BMI = 25: 164.1  GEN: WDWN, NAD, Non-toxic, A & O x 3, overweight, looks well HEENT: Atraumatic, Normocephalic. Neck supple. No masses, No LAD.  Bilateral TM wnl, oropharynx normal.  PEERL,EOMI.   Ears and Nose: No external deformity. CV: RRR, No M/G/R. No JVD. No thrill. No extra heart sounds. PULM: CTA B, no wheezes, crackles, rhonchi. No retractions. No resp. distress. No accessory muscle use. EXTR: No c/c/e NEURO Normal gait.  PSYCH: Normally interactive. Conversant. Not depressed or anxious appearing.  Calm demeanor.   EKG:  bradycardia with rate of 44- OW normal. Compared to 2015 no change  [facility] reading (PRIMARY) by  Dr. Patsy Lageropland. CXR: negative  CHEST 2 VIEW  COMPARISON: 12/15/2013  FINDINGS: The heart size and mediastinal contours are within normal limits. Both lungs are clear. The visualized skeletal structures are unremarkable.  IMPRESSION: No active cardiopulmonary disease.  Results for orders placed or performed in visit on 02/12/15  Troponin I  Result Value Ref Range   Troponin I <0.01 <0.06 ng/mL  POCT Influenza A/B  Result Value Ref Range   Influenza A, POC Negative Negative   Influenza B, POC Negative Negative     Assessment and Plan: Malaise - Plan: POCT Influenza A/B  Other chest pain - Plan: DG Chest 2 View, EKG 12-Lead, acetaminophen-codeine (TYLENOL #3) 300-30 MG tablet,  Troponin I  Cough - Plan: POCT Influenza A/B, DG Chest 2 View, acetaminophen-codeine (TYLENOL #3) 300-30 MG tablet  Acute bronchitis due to other specified organisms - Plan: amoxicillin (AMOXIL) 875 MG tablet here today with bronchitis- treat with amoxicillin which she is able to tolerate although she cannot take augmentin. Chest pain seems non- cardiac but did troponin to be sure.  This is also negative.  Called pt to alert that this is negative Tylenol #3 for cough    Signed Abbe Amsterdam, MD

## 2015-02-12 NOTE — Patient Instructions (Signed)
We will check your troponin level today just to make sure all is well I agree that it would be wise to see your cardiologist for a check- up this year We will treat your chest infection with amoxicillin Let me know if you are not feeling better soon! Use the tylenol #3 for cough and chest pain- if you are using this do not take regular tylenol also

## 2015-02-17 ENCOUNTER — Telehealth: Payer: Self-pay

## 2015-02-17 NOTE — Telephone Encounter (Signed)
Pt is taking amoxicillian and is having heaviness in chest and has had this reaction with other antibodics before   Best number [phone]

## 2015-02-19 NOTE — Telephone Encounter (Signed)
Chest heaviness is not a typical adverse reaction to antibiotics, though it IS a symptom sometimes associated with bronchitis.  She can stop the amoxicillin.  Forwarding message to Dr. Patsy Lager to determine if other treatment is indicated.

## 2015-02-19 NOTE — Telephone Encounter (Signed)
Called and LMOM- chest tightness is not a typical SE of abx.  However at this point she can stop the medication.  We don't have much in the way of alternative abx as she has some many reported allergies. If she is not feeling well in the next few days please come in and see [place] for a recheck

## 2015-03-07 ENCOUNTER — Ambulatory Visit (INDEPENDENT_AMBULATORY_CARE_PROVIDER_SITE_OTHER): Payer: 59

## 2015-03-07 DIAGNOSIS — Z23 Encounter for immunization: Secondary | ICD-10-CM | POA: Diagnosis not present

## 2015-06-07 ENCOUNTER — Telehealth: Payer: Self-pay

## 2015-06-07 NOTE — Telephone Encounter (Signed)
Patient called in she is having random periods of vision loss and head ache. She had this happen yesterday and it lasted about 10 seconds. One of the retina doctors looked at her in her office and said she needed to come to her pcp for a follow up. She refused team health and wanted to wait until June to come in but I was able to get her to come next week. Just wanted to make you aware of this situation for you to advise if need be. This is one of your [facility] patients so I scheduled her a 30 minute appointment for Monday @ 3:30.  Let me know if you need me to do anything else

## 2015-06-07 NOTE — Telephone Encounter (Signed)
Called her back- she has had this symptom 5-6 times over the last several years.  She did not have an eye exam this time but she has had dilated eye exams twice when this happened in the past.  She is now back to normal She has not had this symptom looked at in the past.   She will go to the ER if this happens again. OW will see her on Monday in clinic

## 2015-06-07 NOTE — Telephone Encounter (Signed)
Called her back- no answer and VM is full.

## 2015-06-11 ENCOUNTER — Encounter: Payer: Self-pay | Admitting: Family Medicine

## 2015-06-11 ENCOUNTER — Ambulatory Visit (INDEPENDENT_AMBULATORY_CARE_PROVIDER_SITE_OTHER): Payer: 59 | Admitting: Family Medicine

## 2015-06-11 VITALS — BP 114/81 | HR 69 | Temp 98.1°F | Ht 66.5 in | Wt 189.9 lb

## 2015-06-11 DIAGNOSIS — H539 Unspecified visual disturbance: Secondary | ICD-10-CM

## 2015-06-11 DIAGNOSIS — R0601 Orthopnea: Secondary | ICD-10-CM

## 2015-06-11 NOTE — Patient Instructions (Signed)
Please go to lab and then downstairs for your chest x-ray.  I am going to refer you first to neurology to look for any cause of your symptoms Also please do have a formal eye exam soon

## 2015-06-11 NOTE — Progress Notes (Signed)
Pre visit review using our clinic review tool, if applicable. No additional management support is needed unless otherwise documented below in the visit note. 

## 2015-06-11 NOTE — Progress Notes (Signed)
[facility] [address] [place], [place] [address] [phone]  Date:  06/11/2015   Name:  Tracy Larsen   DOB:  05/16/1960   MRN:  [id number]  PCP:  Tonye Pearson, MD    Chief Complaint: Loss of Vision   History of Present Illness:  Tracy Larsen is a 55 y.o. very pleasant female patient who presents with the following:  Here today to discuss some concerns about some unusual episodes that she had had;  She has noted some episodes of losing vision (both eyes)- this had happened a few times over the last 5-6 years but had not happened for about 2 years until it occurred last week. She had a feeling of "something rushing into my head, like fluid." notes "pressure in my head."  These sx will last 12- 15 seconds.  "it looks like a curtain coming down over both eyes, there will be a little light at the very bottom, and then it starts to come up slowly." No associated HA- "just a feeling of pressure."  She has had a few ocular migraines in the past, not sure if her sx were quite like this Most recent episode occurred about a week ago now Her vision is completley back to normal currently  She is under a lot of stress right now- her 3yo grandson is in a body case due to a femur fracture/ bike accident and is staying in their homefor 6 weeks.  These episodes have been associated with stress in the past She did have dilated eye exam - twice- when she had these sx before.  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  She does not feel this could be consistent as it will be in both eyes  Her history lists TIA, but pt states this is not certain.   She did see a neurologist years ago.  Does not recall exactly what they found Her last formal eye exam was 1.5- 2 years ago  Over the last 1-2 months she  has noted that she needs to sleep on 2 pillows instead of 1 or she will feel like she cannot breathe.  No ankle swelling, no PND No CP, no SOB Never had any CHF or any other heart problems except benign MVP  She also wonders if her sx could be due to hypotension or bradycardia Not on any BP medications    Patient Active Problem List   Diagnosis Date Noted  . IBS (irritable bowel syndrome) 04/22/2013  . Mitral valve prolapse 09/24/2012  . TIA (transient ischemic attack) 09/24/2012    Past Medical History  Diagnosis Date  . Herpes   . Endometriosis   . Mitral valve prolapse   . GERD (gastroesophageal reflux disease)   . IBS (irritable bowel syndrome)   . HSV (herpes simplex virus) infection   . Insomnia   . Asthma   . Allergy   . Arthritis     Past Surgical History  Procedure Laterality Date  . Bladder repair      AGE 8  . Pelvic laparoscopy      X 5  . Urethral stricture dilatation    . Nasal sinus surgery      Social History  Substance Use Topics  . Smoking status: Former Games developer  .  Smokeless tobacco: Never Used  . Alcohol Use: 0.0 oz/week    0 Standard drinks or equivalent per week     Comment: Occasional    Family History  Problem Relation Age of Onset  . Cancer Mother     VULVAR CANCER  . Heart disease Father     MI in his 75s  . Cancer Brother     SARCOMA  . Heart disease Brother     WPW  . Diabetes Paternal Grandmother     Allergies  Allergen Reactions  . Augmentin [Amoxicillin-Pot Clavulanate] Shortness Of Breath  . Avelox [Moxifloxacin Hcl In Nacl] Shortness Of Breath and Palpitations  . Levofloxacin Shortness Of Breath and Palpitations  . Entex Other (See Comments)    Tongue peeling   . Erythromycin Diarrhea  . Promethazine Other (See Comments)    Made butt feel like it was on fire -- Suppository    . Zofran [Ondansetron Hcl] Nausea Only and Other (See Comments)    Makes nausea much worse and dizziness   . Sulfa Antibiotics Rash  .  Tetracyclines & Related Rash    Medication list has been reviewed and updated.  Current Outpatient Prescriptions on File Prior to Visit  Medication Sig Dispense Refill  . acyclovir (ZOVIRAX) 400 MG tablet Take 400 mg by mouth 2 (two) times daily. Uses as needed    . estradiol (ESTRACE) 1 MG tablet Take 1 mg by mouth daily.    . Fluticasone Propionate (FLONASE NA) Place into the nose.    . ibuprofen (ADVIL,MOTRIN) 200 MG tablet Take 400 mg by mouth every 6 (six) hours as needed for moderate pain.    . medroxyPROGESTERone (PROVERA) 2.5 MG tablet Take 2.5 mg by mouth daily.     No current facility-administered medications on file prior to visit.    Review of Systems:  As per HPI- otherwise negative. Postmenopausal No fever, cough, other signs of acute illness   Physical Examination: Filed Vitals:   06/11/15 1600  BP: 114/81  Pulse: 69  Temp: 98.1 F (36.7 C)   Filed Vitals:   06/11/15 1600  Height: 5' 6.5" (1.689 m)  Weight: 189 lb 14.4 oz (86.138 kg)   Body mass index is 30.2 kg/(m^2). Ideal Body Weight: Weight in (lb) to have BMI = 25: 156.9  GEN: WDWN, NAD, Non-toxic, A & O x 3, overweight, looks well HEENT: Atraumatic, Normocephalic. Neck supple. No masses, No LAD.  Bilateral TM wnl, oropharynx normal.  PEERL,EOMI.  Limited fundoscopic exam wnl Ears and Nose: No external deformity. CV: RRR, No M/G/R. No JVD. No thrill. No extra heart sounds. PULM: CTA B, no wheezes, crackles, rhonchi. No retractions. No resp. distress. No accessory muscle use. ABD: S, NT, ND. No rebound. No HSM. EXTR: No c/c/e NEURO Normal gait.   Complete neuro exam including strength, sensation and DTR of all extremities, facial movement and sensation, romberg, RAM and tandem gait all normal PSYCH: Normally interactive. Conversant. Not depressed or anxious appearing.  Calm demeanor.   EKG:  Sinus bradycardia with rate of 52  Pulse Readings from Last 3 Encounters:  06/11/15 69  02/12/15 70   01/25/15 65    Assessment and Plan: Vision changes - Plan: Ambulatory referral to Neurology  Orthopnea - Plan: EKG 12-Lead, DG Chest 2 View, B Nat Peptide, CBC, Comprehensive metabolic panel, TSH  Here today with symptom of unusual, occasional episodes of vision loss and feeling of pressure in her head.  DDX includes TIA, atypical migraine, orthostatic  hypotension or bradycardia, anxiety.  Most critical to rule out TIA to reduce risk of stroke so will refer to neurology first She also has noted orthopnea.  Will work- up as above to help rule- out CHF.    Signed Abbe AmsterdamJessica Linc Renne, MD

## 2015-06-12 LAB — COMPREHENSIVE METABOLIC PANEL
ALBUMIN: 4.2 g/dL (ref 3.5–5.2)
ALT: 8 U/L (ref 0–35)
AST: 14 U/L (ref 0–37)
Alkaline Phosphatase: 79 U/L (ref 39–117)
BILIRUBIN TOTAL: 0.3 mg/dL (ref 0.2–1.2)
BUN: 18 mg/dL (ref 6–23)
CALCIUM: 9.2 mg/dL (ref 8.4–10.5)
CO2: 27 mEq/L (ref 19–32)
CREATININE: 0.84 mg/dL (ref 0.40–1.20)
Chloride: 108 mEq/L (ref 96–112)
GFR: 75.23 mL/min (ref >60.00)
Glucose, Bld: 92 mg/dL (ref 70–99)
Potassium: 4.2 mEq/L (ref 3.5–5.1)
Sodium: 140 mEq/L (ref 135–145)
TOTAL PROTEIN: 7.1 g/dL (ref 6.0–8.3)

## 2015-06-12 LAB — TSH: TSH: 1.36 u[IU]/mL (ref 0.35–4.50)

## 2015-06-12 LAB — CBC
HCT: 39 % (ref 36.0–46.0)
Hemoglobin: 13.1 g/dL (ref 12.0–15.0)
MCHC: 33.5 g/dL (ref 30.0–36.0)
MCV: 86.7 fl (ref 78.0–100.0)
PLATELETS: 223 10*3/uL (ref 150.0–400.0)
RBC: 4.5 Mil/uL (ref 3.87–5.11)
RDW: 12.7 % (ref 11.5–15.5)
WBC: 6.2 10*3/uL (ref 4.0–10.5)

## 2015-06-12 LAB — BRAIN NATRIURETIC PEPTIDE: Pro B Natriuretic peptide (BNP): 30 pg/mL (ref 0.0–100.0)

## 2015-06-13 ENCOUNTER — Encounter: Payer: Self-pay | Admitting: Family Medicine

## 2015-06-14 ENCOUNTER — Telehealth: Payer: Self-pay | Admitting: Family Medicine

## 2015-06-14 NOTE — Telephone Encounter (Addendum)
Spoke to pt gave results. Informed pt that signing up for mychart will be the fastest way to get results.

## 2015-06-14 NOTE — Telephone Encounter (Signed)
Left message for pt to call back to get lab results.

## 2015-06-14 NOTE — Telephone Encounter (Signed)
Can be reached: [phone]  Reason for call: Pt f/u on her lab results. Advised not reviewed yet. She also wanted to notify Dr. Patsy Lageropland that she did not have chest xray because imaging was closed and she was apprehensive about going thru the ED to have it done.

## 2015-06-14 NOTE — Telephone Encounter (Signed)
Please give her a call- she has a lab letter on the way.  No sign of heart failure.  Signing up for mychart if the fastest way to get labs if she would like to do this

## 2015-07-02 ENCOUNTER — Ambulatory Visit (INDEPENDENT_AMBULATORY_CARE_PROVIDER_SITE_OTHER): Payer: 59 | Admitting: Neurology

## 2015-07-02 ENCOUNTER — Encounter: Payer: Self-pay | Admitting: Neurology

## 2015-07-02 VITALS — BP 102/78 | HR 76 | Ht 66.5 in | Wt 188.0 lb

## 2015-07-02 DIAGNOSIS — I480 Paroxysmal atrial fibrillation: Secondary | ICD-10-CM

## 2015-07-02 DIAGNOSIS — H539 Unspecified visual disturbance: Secondary | ICD-10-CM

## 2015-07-02 MED ORDER — ALPRAZOLAM 0.5 MG PO TABS: ORAL_TABLET | ORAL | Status: DC

## 2015-07-02 NOTE — Progress Notes (Signed)
Reason for visit: Transient visual disturbance  Referring physician: Dr. Antony Salmon is a 55 y.o. female  History of present illness:  Ms. Templin is a 55 year old right-handed white female with a history of transient events of visual disturbance that have occurred off and on over the last 5-6 years. The patient has not had any events in 2 years, but beginning about 3 weeks ago she has had recurrence of these episodes, she has had at least 2 episodes, the last occurring 2 days prior to this evaluation. The patient indicated that the events are associated with a shade coming over the eyes bilaterally, she may have a small rim of vision inferiorly. The episodes are relatively painless in nature, but may be associated with a sensation of fullness in the head. The patient feels lightheaded, as if she might pass out. The patient denies any palpitations of the heart, shortness of breath, or diaphoresis during the episodes. The patient feels normal between the events. She does have a history of palpitations of the heart, she has been seen by cardiology in the past and she has undergone a 30 day cardiac monitor study that she claims was unremarkable. She has been found to have mitral valve prolapse on an echocardiogram. The patient indicates that the events of visual disturbance may occur while standing or while sitting. She denies any focal symptoms of numbness, weakness, speech disturbance, or confusion with the events. She does have a history of migraine that may be associated with some visual complaints with wavy vision that may occur for 15-20 minutes, with a headache then ensuing. She also reports episodes of vertigo in the past, and she may have problems with drifting to the right with walking. She comes to this office for further evaluation.  Past Medical History  Diagnosis Date  . Herpes   . Endometriosis   . Mitral valve prolapse   . GERD (gastroesophageal reflux disease)     . IBS (irritable bowel syndrome)   . HSV (herpes simplex virus) infection   . Insomnia   . Asthma   . Allergy   . Arthritis     Past Surgical History  Procedure Laterality Date  . Bladder repair      AGE 69  . Pelvic laparoscopy      X 5  . Urethral stricture dilatation    . Nasal sinus surgery      Family History  Problem Relation Age of Onset  . Cancer Mother     VULVAR CANCER  . Heart disease Father     MI in his 8s  . Cancer Brother     SARCOMA  . Heart disease Brother     WPW  . Diabetes Paternal Grandmother     Social history:  reports that she has quit smoking. She has never used smokeless tobacco. She reports that she drinks alcohol. She reports that she does not use illicit drugs.  Medications:  Prior to Admission medications   Medication Sig Start Date End Date Taking? Authorizing Provider  acyclovir (ZOVIRAX) 400 MG tablet Take 400 mg by mouth 2 (two) times daily. Uses as needed 03/11/11   Dara Lords, MD  estradiol (ESTRACE) 1 MG tablet Take 1 mg by mouth daily.    Historical Provider, MD  Fluticasone Propionate (FLONASE NA) Place into the nose.    Historical Provider, MD  ibuprofen (ADVIL,MOTRIN) 200 MG tablet Take 400 mg by mouth every 6 (six) hours as needed for  moderate pain.    Historical Provider, MD  medroxyPROGESTERone (PROVERA) 2.5 MG tablet Take 2.5 mg by mouth daily.    Historical Provider, MD      Allergies  Allergen Reactions  . Augmentin [Amoxicillin-Pot Clavulanate] Shortness Of Breath  . Avelox [Moxifloxacin Hcl In Nacl] Shortness Of Breath and Palpitations  . Levofloxacin Shortness Of Breath and Palpitations  . Entex Other (See Comments)    Tongue peeling   . Erythromycin Diarrhea  . Promethazine Other (See Comments)    Made butt feel like it was on fire -- Suppository    . Zofran [Ondansetron Hcl] Nausea Only and Other (See Comments)    Makes nausea much worse and dizziness   . Sulfa Antibiotics Rash  . Tetracyclines &  Related Rash    ROS:  Out of a complete 14 system review of symptoms, the patient complains only of the following symptoms, and all other reviewed systems are negative.  Palpitations of the heart Moles Loss of vision Diarrhea, constipation Feeling hot Achy muscles Allergies, runny nose Dizziness, near syncope Anxiety Insomnia  Blood pressure 102/78, pulse 76, height 5' 6.5" (1.689 m), weight 188 lb (85.276 kg), last menstrual period 09/27/2011.   Blood pressure, sitting, right arm is 112 systolic, blood pressure, standing, right arm is 108 systolic.  Physical Exam  General: The patient is alert and cooperative at the time of the examination.  Eyes: Pupils are equal, round, and reactive to light. Discs are flat bilaterally.  Neck: The neck is supple, no carotid bruits are noted.  Respiratory: The respiratory examination is clear.  Cardiovascular: The cardiovascular examination reveals a regular rate and rhythm, no obvious murmurs or rubs are noted.  Skin: Extremities are without significant edema.  Neurologic Exam  Mental status: The patient is alert and oriented x 3 at the time of the examination. The patient has apparent normal recent and remote memory, with an apparently normal attention span and concentration ability.  Cranial nerves: Facial symmetry is present. There is good sensation of the face to pinprick and soft touch bilaterally. The strength of the facial muscles and the muscles to head turning and shoulder shrug are normal bilaterally. Speech is well enunciated, no aphasia or dysarthria is noted. Extraocular movements are full. Visual fields are full. The tongue is midline, and the patient has symmetric elevation of the soft palate. No obvious hearing deficits are noted.  Motor: The motor testing reveals 5 over 5 strength of all 4 extremities. Good symmetric motor tone is noted throughout.  Sensory: Sensory testing is intact to pinprick, soft touch, vibration  sensation, and position sense on all 4 extremities. No evidence of extinction is noted.  Coordination: Cerebellar testing reveals good finger-nose-finger and heel-to-shin bilaterally.  Gait and station: Gait is normal. Tandem gait is normal. Romberg is negative. No drift is seen.  Reflexes: Deep tendon reflexes are symmetric and normal bilaterally. Toes are downgoing bilaterally.   Assessment/Plan:  1. Transient visual disturbance, near syncope  The patient has had multiple episodes in the past of transient visual disturbance, but this has started back recently. The patient has a visual disturbances consistent with a drop in blood pressure or decreased perfusion of the brain. The patient will be set up for MRI of the brain, MRA of the head, and a 30 day cardiac monitor study. She does report palpitations of the heart, episodes of increased heart rate. The patient also reports new problems over the last several weeks of orthopnea. She may require a  repeat 2-D echocardiogram in the future. She has seen cardiology in the past. She will follow-up through this office if needed.  Marlan Palau. Keith Willis MD 07/02/2015 9:10 PM  [facility] [address] [place], [place] [id number]  Phone [phone] Fax [phone]

## 2015-07-03 DIAGNOSIS — H539 Unspecified visual disturbance: Secondary | ICD-10-CM | POA: Diagnosis not present

## 2015-07-03 DIAGNOSIS — H04123 Dry eye syndrome of bilateral lacrimal glands: Secondary | ICD-10-CM | POA: Diagnosis not present

## 2015-07-03 DIAGNOSIS — H2513 Age-related nuclear cataract, bilateral: Secondary | ICD-10-CM | POA: Diagnosis not present

## 2015-07-03 DIAGNOSIS — H524 Presbyopia: Secondary | ICD-10-CM | POA: Diagnosis not present

## 2015-07-04 ENCOUNTER — Ambulatory Visit (INDEPENDENT_AMBULATORY_CARE_PROVIDER_SITE_OTHER): Payer: 59

## 2015-07-04 DIAGNOSIS — H539 Unspecified visual disturbance: Secondary | ICD-10-CM | POA: Diagnosis not present

## 2015-07-04 DIAGNOSIS — Z0289 Encounter for other administrative examinations: Secondary | ICD-10-CM

## 2015-07-06 ENCOUNTER — Telehealth: Payer: Self-pay | Admitting: Neurology

## 2015-07-06 NOTE — Telephone Encounter (Signed)
I called the patient, I was able to view the MRI the brain, by my reading appears to be normal, I was unable to locate the images for the MRA of the head, I will try to call patient once again the formal results of the studies.

## 2015-07-06 NOTE — Telephone Encounter (Signed)
Patient called to request results of MRI/MRA. Please call [phone].

## 2015-07-07 ENCOUNTER — Telehealth: Payer: Self-pay | Admitting: Neurology

## 2015-07-07 NOTE — Telephone Encounter (Signed)
  I called the patient. MRI of the brain is normal. I do not see that the MRA was ever done. I will try to find out about this.  MRI head 07/06/15:  IMPRESSION: Unremarkable MRI scan of the brain without contrast

## 2015-07-09 NOTE — Telephone Encounter (Signed)
Patient is calling back and states that the MRA was done without contrast.  Please call back. Thanks!

## 2015-07-09 NOTE — Telephone Encounter (Signed)
MRA brain 07/04/2015 shows no large vessel stenosis. 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

## 2015-07-09 NOTE — Telephone Encounter (Signed)
I called the patient. The MRA of the head was done, it has not been read out, but by my reading this appears to be normal. I will send a note to the radiologist to read the study.

## 2015-07-16 ENCOUNTER — Telehealth: Payer: Self-pay | Admitting: Neurology

## 2015-07-16 NOTE — Telephone Encounter (Signed)
Pt requests MRI/MRA results sent to her home

## 2015-07-16 NOTE — Telephone Encounter (Signed)
Pt MRI/MRA mailed to pt home address.

## 2015-07-20 DIAGNOSIS — N3020 Other chronic cystitis without hematuria: Secondary | ICD-10-CM | POA: Diagnosis not present

## 2015-07-20 DIAGNOSIS — N1370 Vesicoureteral-reflux, unspecified: Secondary | ICD-10-CM | POA: Diagnosis not present

## 2015-07-27 ENCOUNTER — Ambulatory Visit (INDEPENDENT_AMBULATORY_CARE_PROVIDER_SITE_OTHER): Payer: 59 | Admitting: Physician Assistant

## 2015-07-27 ENCOUNTER — Encounter: Payer: Self-pay | Admitting: Physician Assistant

## 2015-07-27 VITALS — BP 114/72 | HR 65 | Ht 66.0 in | Wt 189.4 lb

## 2015-07-27 DIAGNOSIS — H53133 Sudden visual loss, bilateral: Secondary | ICD-10-CM

## 2015-07-27 DIAGNOSIS — I341 Nonrheumatic mitral (valve) prolapse: Secondary | ICD-10-CM

## 2015-07-27 NOTE — Patient Instructions (Signed)
Your physician has requested that you have an echocardiogram. Echocardiography is a painless test that uses sound waves to create images of your heart. It provides your doctor with information about the size and shape of your heart and how well your heart's chambers and valves are working. This procedure takes approximately one hour. There are no restrictions for this procedure.  Your physician recommends that you schedule a follow-up appointment with Dr Jens Somrenshaw following echo.

## 2015-07-27 NOTE — Progress Notes (Signed)
Cardiology Office Note   Date:  07/27/2015   ID:  Tracy Larsen, DOB 03/29/1960, MRN [id number]  PCP:  Abbe AmsterdamOPLAND,JESSICA, MD  Cardiologist:  Dr Rosemarie Beathrenshaw  Tracy Hausmann, PA-C   Chief Complaint  Patient presents with  . Follow-up    lightheaded/dizziness; randomy, frequently    History of Present Illness: Tracy Larsen is a 55 y.o. female with a history of MVP, GERD, IBS, asthma.  Tracy Mingseresa H Reader presents for Evaluation of episodes of temporary vision loss that concern her.  About 6 years ago, Tracy Larsen began having episodes of temporary vision loss. The episodes are all the same. Her vision will darken bilaterally as if a shade is coming down over her eyes. There will be a small line of light across the bottom but she can't see clearly out of through it. There is no association with exertion. It has never happened while she was driving. It will last about 20 seconds and then the shade slowly goes back up. There is no postictal state, as soon as her vision clears, she is able to function as she was before. She will feel a bit weak while it is happening, but is never fallen. She has had episodes that happen while she is standing still, but she did not sit or fall.  A recent episode was associated with a rushing warm feeling in her head that was uncomfortable for her. Before that episode, the last one was over a year ago. She will get a 2 or 3 seconds warning that this is happening so she can stand still or stop what she is doing.  She works for an [employer], [employer] doctor and after a recent episode, she asked him about it. He wondered if it was related to blood pressure or blood sugar. She has checked her blood sugar at home before and it has always been fine. He told her to blow all of her air out the next time it happened. She did this and she was able to avert the vision loss although she still felt a little weak for several seconds.  She has not had chest pain or palpitations with  this. She has not been short of breath.  She has had ocular migraines in the past, but this is different.  She has had vertigo and has been having problems with that recently. This is different.  The episodes have, in the setting of emotional stress from various issues. She has never had one that started when she was relaxed and calm.  She has a fairly stressful job and is also having some family issues, so she feels that she is under a lot of pressure right now.  Her blood pressure generally runs on the low side of normal and has been in the 90s before but she was asymptomatic with that. Her husband is a diabetic and she has checked her sugar varies times, but it is always okay.  She is anxious about this because her brother has had a cardiac arrest secondary to WPW, her mother had a history of atrial fibrillation, and her father died of an MI at 10866 years all.   Past Medical History  Diagnosis Date  . Herpes   . Endometriosis   . Mitral valve prolapse   . GERD (gastroesophageal reflux disease)   . IBS (irritable bowel syndrome)   . HSV (herpes simplex virus) infection   . Insomnia   . Asthma   . Allergy   . Arthritis  Past Surgical History  Procedure Laterality Date  . Bladder repair      AGE 55  . Pelvic laparoscopy      X 5  . Urethral stricture dilatation    . Nasal sinus surgery      Current Outpatient Prescriptions  Medication Sig Dispense Refill  . acyclovir (ZOVIRAX) 400 MG tablet Take 400 mg by mouth 2 (two) times daily. Uses as needed    . estradiol (ESTRACE) 1 MG tablet Take 1 mg by mouth daily.    . Fluticasone Propionate (FLONASE NA) Place into the nose.    . ibuprofen (ADVIL,MOTRIN) 200 MG tablet Take 400 mg by mouth every 6 (six) hours as needed for moderate pain.    . medroxyPROGESTERone (PROVERA) 2.5 MG tablet Take 2.5 mg by mouth daily.     No current facility-administered medications for this visit.    Allergies:   Augmentin; Avelox;  Levofloxacin; Entex; Erythromycin; Promethazine; Zofran; Sulfa antibiotics; and Tetracyclines & related    Social History:  The patient  reports that she has quit smoking. She has never used smokeless tobacco. She reports that she drinks alcohol. She reports that she does not use illicit drugs.   Family History:  The patient's family history includes Cancer in her brother and mother; Diabetes in her paternal grandmother; Heart disease in her brother and father.    ROS:  Please see the history of present illness. All other systems are reviewed and negative.    PHYSICAL EXAM: VS:  BP 114/72 mmHg  Pulse 65  Ht 5\' 6"  (1.676 m)  Wt 189 lb 6.4 oz (85.911 kg)  BMI 30.58 kg/m2  LMP 09/27/2011 , BMI Body mass index is 30.58 kg/(m^2). GEN: Well nourished, well developed, female in no acute distress HEENT: normal for age  Neck: no JVD, no carotid bruit, no masses Cardiac: RRR; faint murmur, no click, no rubs, or gallops Respiratory:  clear to auscultation bilaterally, normal work of breathing GI: soft, nontender, nondistended, + BS Tracy: no deformity or atrophy; no edema; distal pulses are 2+ in all 4 extremities  Skin: warm and dry, no rash Neuro:  Strength and sensation are intact Psych: euthymic mood, full affect   EKG:  EKG is ordered today. The ekg ordered today demonstrates sinus rhythm with rightward axis which is unchanged from previous, no delta wave, no arrhythmias seen   Recent Labs: 06/11/2015: ALT 8; BUN 18; Creatinine, Ser 0.84; Hemoglobin 13.1; Platelets 223.0; Potassium 4.2; Pro B Natriuretic peptide (BNP) 30.0; Sodium 140; TSH 1.36    Lipid Panel No results found for: CHOL, TRIG, HDL, CHOLHDL, VLDL, LDLCALC, LDLDIRECT   Wt Readings from Last 3 Encounters:  07/27/15 189 lb 6.4 oz (85.911 kg)  07/02/15 188 lb (85.276 kg)  06/11/15 189 lb 14.4 oz (86.138 kg)     Other studies Reviewed: Additional studies/ records that were reviewed today include: Office notes, hospital  records and testing.  ASSESSMENT AND PLAN:  1.  Bilateral vision loss: I advised her that since she did not have any palpitations and it lasted as much as 20 seconds, I did not feel this was typical for cardiac-induced symptoms. An event monitor has been ordered by Dr. Anne HahnWillis but has not been performed yet. If she wishes to get this that is fine, but if she does not wish to get it that is also fine. The episodes are not frequent enough that I feel it is likely an event monitor will catch anything. I encouraged her to  follow-up with primary care and neurology.  I discussed an implantable loop recorder with her, but she does not wish to do this right now.  2. Mitral valve prolapse: She has a history of this but her last echo was over 10 years ago. We will repeat this, although I reassured her that she was not having any significant problems on physical exam.   Current medicines are reviewed at length with the patient today.  The patient does not have concerns regarding medicines.  The following changes have been made:  no change  Labs/ tests ordered today include: ECG   Orders Placed This Encounter  Procedures  . ECHOCARDIOGRAM COMPLETE     Disposition:   FU with Dr. Jens Som  Signed, Theodore Demark, PA-C  07/27/2015 5:03 PM    [facility] Phone: [phone]; Fax: [phone]  This note was written with the assistance of speech recognition software. Please excuse any transcriptional errors.

## 2015-08-07 NOTE — Addendum Note (Signed)
Addended by: Freddi StarrMATHIS, DEBRA W on: 08/07/2015 08:53 AM   Modules accepted: Orders

## 2015-08-16 ENCOUNTER — Telehealth: Payer: Self-pay | Admitting: Cardiology

## 2015-08-16 NOTE — Telephone Encounter (Signed)
Left message for patient that her follow up appt will be made as pending result of her echo.  No appt was made at this time.

## 2015-08-24 ENCOUNTER — Ambulatory Visit (HOSPITAL_COMMUNITY): Payer: 59 | Attending: Internal Medicine

## 2015-08-24 ENCOUNTER — Other Ambulatory Visit (HOSPITAL_COMMUNITY): Payer: Self-pay

## 2015-08-24 DIAGNOSIS — I517 Cardiomegaly: Secondary | ICD-10-CM | POA: Insufficient documentation

## 2015-08-24 DIAGNOSIS — I341 Nonrheumatic mitral (valve) prolapse: Secondary | ICD-10-CM | POA: Diagnosis not present

## 2015-08-24 DIAGNOSIS — I059 Rheumatic mitral valve disease, unspecified: Secondary | ICD-10-CM | POA: Diagnosis present

## 2015-08-24 DIAGNOSIS — I340 Nonrheumatic mitral (valve) insufficiency: Secondary | ICD-10-CM | POA: Insufficient documentation

## 2015-10-09 ENCOUNTER — Ambulatory Visit (INDEPENDENT_AMBULATORY_CARE_PROVIDER_SITE_OTHER): Payer: Worker's Compensation

## 2015-10-09 ENCOUNTER — Telehealth: Payer: Self-pay | Admitting: Cardiology

## 2015-10-09 ENCOUNTER — Ambulatory Visit (INDEPENDENT_AMBULATORY_CARE_PROVIDER_SITE_OTHER): Payer: Worker's Compensation | Admitting: Family Medicine

## 2015-10-09 VITALS — BP 126/70 | HR 70 | Temp 97.7°F | Resp 18 | Ht 66.0 in | Wt 180.0 lb

## 2015-10-09 DIAGNOSIS — S5001XA Contusion of right elbow, initial encounter: Secondary | ICD-10-CM

## 2015-10-09 DIAGNOSIS — S060X0A Concussion without loss of consciousness, initial encounter: Secondary | ICD-10-CM

## 2015-10-09 DIAGNOSIS — S60211A Contusion of right wrist, initial encounter: Secondary | ICD-10-CM | POA: Diagnosis not present

## 2015-10-09 DIAGNOSIS — M25531 Pain in right wrist: Secondary | ICD-10-CM | POA: Diagnosis not present

## 2015-10-09 DIAGNOSIS — S7011XA Contusion of right thigh, initial encounter: Secondary | ICD-10-CM | POA: Diagnosis not present

## 2015-10-09 NOTE — Telephone Encounter (Signed)
I have removed pt Event monitor order per her request she will not be getting the monitor.    10/09/2015 spoke with pt she advised she does not need monitor at this time. she had echo done and it was normal. she wants to be removed from our calling list. stpegram 09/24/2015 [facility] and schedule monitor. stpegram 6-26 pt is called to inform that she will call us when she is ready to schedule the monitor, ks 07/13/2015 [facility] for pt to call and schedule Event monitor. stpegram  07/03/15  LMOM TO CALL/D.MILLER

## 2015-10-09 NOTE — Progress Notes (Signed)
Subjective:  By signing my name below, I, Stann Ore, attest that this documentation has been prepared under the direction and in the presence of Meredith Staggers, MD. Electronically Signed: Stann Ore, Scribe. 10/09/2015 , 10:18 AM .  Patient was seen in Room 6 .   Patient ID: Tracy Larsen, female    DOB: [birth year], 55 y.o.   MRN: [id number] Chief Complaint  Patient presents with  . Fall    HIT BACK OF HEAD AND RIGHT ARM AND ELBOW THROBBING   HPI Tracy Larsen is a 55 y.o. female  Patient is here after a fall at work today. She states the cleaning people had left some water on the floor overnight. She slipped over the area at around 8:15AM~8:20AM earlier today, where her right leg slipped and went up. She landed on her right hand, right elbow, right side of her head, and right hip. She had a headache and dazed-feeling immediately after she hit her head, and has taken 3 tablets of advil. She denies vomiting, LOC, blurry vision or double vision. She denies prior aches or pains in her right arm or right hand or chronic migraines. She mentions a couple coworkers hearing her head hit the floor when she fell. They also informed that she had some slurred speech initially, but this isn't noticed during office visit.   She's able to use her hand but has a pulling feeling. She's applied ice over her hand immediately post fall. She's also able to move her right elbow but just feels sore. She hasn't noticed any bruises over the affected areas. She's had right shoulder problems in the past. She's been seen for vision spells in the past. She's right hand dominant.   She works at [employer].   Allergies  Allergen Reactions  . Augmentin [Amoxicillin-Pot Clavulanate] Shortness Of Breath  . Avelox [Moxifloxacin Hcl In Nacl] Shortness Of Breath and Palpitations  . Levofloxacin Shortness Of Breath and Palpitations  . Entex Other (See Comments)    Tongue peeling   . Erythromycin  Diarrhea  . Promethazine Other (See Comments)    Made butt feel like it was on fire -- Suppository    . Zofran [Ondansetron Hcl] Nausea Only and Other (See Comments)    Makes nausea much worse and dizziness   . Sulfa Antibiotics Rash  . Tetracyclines & Related Rash   Prior to Admission medications   Medication Sig Start Date End Date Taking? Authorizing Provider  acyclovir (ZOVIRAX) 400 MG tablet Take 400 mg by mouth 2 (two) times daily. Uses as needed 03/11/11   Dara Lords, MD  estradiol (ESTRACE) 1 MG tablet Take 1 mg by mouth daily.    Historical Provider, MD  Fluticasone Propionate (FLONASE NA) Place into the nose.    Historical Provider, MD  ibuprofen (ADVIL,MOTRIN) 200 MG tablet Take 400 mg by mouth every 6 (six) hours as needed for moderate pain.    Historical Provider, MD  medroxyPROGESTERone (PROVERA) 2.5 MG tablet Take 2.5 mg by mouth daily.    Historical Provider, MD   Review of Systems  Constitutional: Negative for chills, diaphoresis, fatigue and fever.  Musculoskeletal: Positive for arthralgias and myalgias. Negative for back pain, gait problem, joint swelling, neck pain and neck stiffness.  Skin: Negative for rash and wound.  Neurological: Positive for dizziness and headaches. Negative for syncope, speech difficulty, weakness and numbness.  Psychiatric/Behavioral: Negative for confusion.       Objective:   Physical Exam  Constitutional:  She is oriented to person, place, and time. She appears well-developed and well-nourished. No distress.  HENT:  Head: Normocephalic and atraumatic. Head is without raccoon's eyes and without Battle's sign.  Right Ear: No hemotympanum.  Left Ear: No hemotympanum.  Tender along the right parietal scalp with minimal soft tissue swelling, skin intact, no wound; c-spine no midline bony tenderness, pain free ROM C-spine  Eyes: EOM are normal. Pupils are equal, round, and reactive to light. Right eye exhibits no nystagmus. Left eye  exhibits no nystagmus.  Neck: Neck supple.  Cardiovascular: Normal rate.   Pulmonary/Chest: Effort normal. No respiratory distress.  Musculoskeletal: Normal range of motion.  Right elbow: slight tenderness over the olecranon and possible ulna diffusely; full ROM, full strength with resisted ROM Right wrist: full ROM, no soft tissue swelling, no redness, skin intact, pain along thenar eminence,  thumb non tender otherwise; scaphoid non tender, distal radius non tender, volar wrist tender at the base of the thenar eminence, able to flex and extend digits without difficulty, strength intact Right hip: lumbar spine non tender, right SI joint and sciatic notch non tender, tender along posterior right thigh, knee is non tender, pain free ROM at the right hip, able to flex and extend knee against resistance without difficulty; right hip skin intact without ecchymosis seen  Neurological: She is alert and oriented to person, place, and time. She displays a negative Romberg sign.  Serial sevens -1, "world" backwards was normal, 5 minute recall 3/3  Skin: Skin is warm and dry.  Psychiatric: She has a normal mood and affect. Her behavior is normal.  Nursing note and vitals reviewed.   Vitals:   10/09/15 0932  BP: 126/70  Pulse: 70  Resp: 18  Temp: 97.7 F (36.5 C)  TempSrc: Oral  SpO2: 99%  Weight: 180 lb (81.6 kg)  Height: 5\' 6"  (1.676 m)   Dg Elbow Complete Right (3+view)  Result Date: 10/09/2015 CLINICAL DATA:  Larey Seat at work today.  Injured right elbow. EXAM: RIGHT ELBOW - COMPLETE 3+ VIEW COMPARISON:  None. FINDINGS: The joint spaces are maintained. No acute fracture is identified. No osteochondral abnormality. No joint effusion. IMPRESSION: No acute fracture or joint effusion. Electronically Signed   By: Rudie Meyer M.D.   On: 10/09/2015 10:40   Dg Wrist Complete Right  Result Date: 10/09/2015 CLINICAL DATA:  55 year old female status post fall at work. Pain and contusion. Initial  encounter. EXAM: RIGHT WRIST - COMPLETE 3+ VIEW COMPARISON:  None. FINDINGS: Distal radius and ulna appear intact. Bone mineralization is within normal limits for age. Carpal bone alignment within normal limits. Scaphoid intact. Visible metacarpals appear intact. IMPRESSION: No acute fracture or dislocation identified about the right wrist. Electronically Signed   By: Odessa Fleming M.D.   On: 10/09/2015 10:38       Assessment & Plan:    Tracy Larsen is a 54 y.o. female with injuries below due to injury at work. DOI 10/09/15. Right wrist pain - Plan: DG Wrist Complete Right, Splint wrist Contusion, wrist, right, initial encounter - Plan: DG Wrist Complete Right, Splint wrist  -contusion vs mild sprain. Wrist brace applied. Symptomatic care discussed, return to clinic precautions discussed.   Elbow contusion, right, initial encounter - Plan: DG ELBOW COMPLETE RIGHT (3+VIEW)  - contusion ,no sign of fx. Tylenol, relative rest, but continue ROM. No splint applied.   Concussion with no loss of consciousness, initial encounter  - no red flags on hx/exam. Out of work  for next 2 days, avoidance of electronic media, tylenol/sx care discussed, RTC/ER precautions discussed. Recheck in 2 days.   Contusion of right thigh, initial encounter  - skin intact, no apparent echymosis or skin change at present. WB without difficulty and no hip pain. Sx care, recheck in 2 days. OOW for 2 days due to concussion.   No orders of the defined types were placed in this encounter.  Patient Instructions    Tylenol is safer than Advil for now. Ice to affected areas on and off for 10 minutes at a time as needed.  Wrist brace as needed until follow-up in 2 days.   Cognitive rest for the next few days, including avoiding electronic media as much as possible. Return to the clinic or go to the nearest emergency room if any of your symptoms worsen or new symptoms occur.    Concussion, Adult A concussion, or closed-head  injury, is a brain injury caused by a direct blow to the head or by a quick and sudden movement (jolt) of the head or neck. Concussions are usually not life-threatening. Even so, the effects of a concussion can be serious. If you have had a concussion before, you are more likely to experience concussion-like symptoms after a direct blow to the head.  CAUSES  Direct blow to the head, such as from running into another player during a soccer game, being hit in a fight, or hitting your head on a hard surface.  A jolt of the head or neck that causes the brain to move back and forth inside the skull, such as in a car crash. SIGNS AND SYMPTOMS The signs of a concussion can be hard to notice. Early on, they may be missed by you, family members, and health care providers. You may look fine but act or feel differently. Symptoms are usually temporary, but they may last for days, weeks, or even longer. Some symptoms may appear right away while others may not show up for hours or days. Every head injury is different. Symptoms include:  Mild to moderate headaches that will not go away.  A feeling of pressure inside your head.  Having more trouble than usual:  Learning or remembering things you have heard.  Answering questions.  Paying attention or concentrating.  Organizing daily tasks.  Making decisions and solving problems.  Slowness in thinking, acting or reacting, speaking, or reading.  Getting lost or being easily confused.  Feeling tired all the time or lacking energy (fatigued).  Feeling drowsy.  Sleep disturbances.  Sleeping more than usual.  Sleeping less than usual.  Trouble falling asleep.  Trouble sleeping (insomnia).  Loss of balance or feeling lightheaded or dizzy.  Nausea or vomiting.  Numbness or tingling.  Increased sensitivity to:  Sounds.  Lights.  Distractions.  Vision problems or eyes that tire easily.  Diminished sense of taste or smell.  Ringing  in the ears.  Mood changes such as feeling sad or anxious.  Becoming easily irritated or angry for little or no reason.  Lack of motivation.  Seeing or hearing things other people do not see or hear (hallucinations). DIAGNOSIS Your health care provider can usually diagnose a concussion based on a description of your injury and symptoms. He or she will ask whether you passed out (lost consciousness) and whether you are having trouble remembering events that happened right before and during your injury. Your evaluation might include:  A brain scan to look for signs of injury to the brain.  Even if the test shows no injury, you may still have a concussion.  Blood tests to be sure other problems are not present. TREATMENT  Concussions are usually treated in an emergency department, in urgent care, or at a clinic. You may need to stay in the hospital overnight for further treatment.  Tell your health care provider if you are taking any medicines, including prescription medicines, over-the-counter medicines, and natural remedies. Some medicines, such as blood thinners (anticoagulants) and aspirin, may increase the chance of complications. Also tell your health care provider whether you have had alcohol or are taking illegal drugs. This information may affect treatment.  Your health care provider will send you home with important instructions to follow.  How fast you will recover from a concussion depends on many factors. These factors include how severe your concussion is, what part of your brain was injured, your age, and how healthy you were before the concussion.  Most people with mild injuries recover fully. Recovery can take time. In general, recovery is slower in older persons. Also, persons who have had a concussion in the past or have other medical problems may find that it takes longer to recover from their current injury. HOME CARE INSTRUCTIONS General Instructions  Carefully follow  the directions your health care provider gave you.  Only take over-the-counter or prescription medicines for pain, discomfort, or fever as directed by your health care provider.  Take only those medicines that your health care provider has approved.  Do not drink alcohol until your health care provider says you are well enough to do so. Alcohol and certain other drugs may slow your recovery and can put you at risk of further injury.  If it is harder than usual to remember things, write them down.  If you are easily distracted, try to do one thing at a time. For example, do not try to watch TV while fixing dinner.  Talk with family members or close friends when making important decisions.  Keep all follow-up appointments. Repeated evaluation of your symptoms is recommended for your recovery.  Watch your symptoms and tell others to do the same. Complications sometimes occur after a concussion. Older adults with a brain injury may have a higher risk of serious complications, such as a blood clot on the brain.  Tell your teachers, school nurse, school counselor, coach, athletic trainer, or work Production designer, theatre/television/film about your injury, symptoms, and restrictions. Tell them about what you can or cannot do. They should watch for:  Increased problems with attention or concentration.  Increased difficulty remembering or learning new information.  Increased time needed to complete tasks or assignments.  Increased irritability or decreased ability to cope with stress.  Increased symptoms.  Rest. Rest helps the brain to heal. Make sure you:  Get plenty of sleep at night. Avoid staying up late at night.  Keep the same bedtime hours on weekends and weekdays.  Rest during the day. Take daytime naps or rest breaks when you feel tired.  Limit activities that require a lot of thought or concentration. These include:  Doing homework or job-related work.  Watching TV.  Working on the computer.  Avoid any  situation where there is potential for another head injury (football, hockey, soccer, basketball, martial arts, downhill snow sports and horseback riding). Your condition will get worse every time you experience a concussion. You should avoid these activities until you are evaluated by the appropriate follow-up health care providers. Returning To Your Regular Activities You  will need to return to your normal activities slowly, not all at once. You must give your body and brain enough time for recovery.  Do not return to sports or other athletic activities until your health care provider tells you it is safe to do so.  Ask your health care provider when you can drive, ride a bicycle, or operate heavy machinery. Your ability to react may be slower after a brain injury. Never do these activities if you are dizzy.  Ask your health care provider about when you can return to work or school. Preventing Another Concussion It is very important to avoid another brain injury, especially before you have recovered. In rare cases, another injury can lead to permanent brain damage, brain swelling, or death. The risk of this is greatest during the first 7-10 days after a head injury. Avoid injuries by:  Wearing a seat belt when riding in a car.  Drinking alcohol only in moderation.  Wearing a helmet when biking, skiing, skateboarding, skating, or doing similar activities.  Avoiding activities that could lead to a second concussion, such as contact or recreational sports, until your health care provider says it is okay.  Taking safety measures in your home.  Remove clutter and tripping hazards from floors and stairways.  Use grab bars in bathrooms and handrails by stairs.  Place non-slip mats on floors and in bathtubs.  Improve lighting in dim areas. SEEK MEDICAL CARE IF:  You have increased problems paying attention or concentrating.  You have increased difficulty remembering or learning new  information.  You need more time to complete tasks or assignments than before.  You have increased irritability or decreased ability to cope with stress.  You have more symptoms than before. Seek medical care if you have any of the following symptoms for more than 2 weeks after your injury:  Lasting (chronic) headaches.  Dizziness or balance problems.  Nausea.  Vision problems.  Increased sensitivity to noise or light.  Depression or mood swings.  Anxiety or irritability.  Memory problems.  Difficulty concentrating or paying attention.  Sleep problems.  Feeling tired all the time. SEEK IMMEDIATE MEDICAL CARE IF:  You have severe or worsening headaches. These may be a sign of a blood clot in the brain.  You have weakness (even if only in one hand, leg, or part of the face).  You have numbness.  You have decreased coordination.  You vomit repeatedly.  You have increased sleepiness.  One pupil is larger than the other.  You have convulsions.  You have slurred speech.  You have increased confusion. This may be a sign of a blood clot in the brain.  You have increased restlessness, agitation, or irritability.  You are unable to recognize people or places.  You have neck pain.  It is difficult to wake you up.  You have unusual behavior changes.  You lose consciousness. MAKE SURE YOU:  Understand these instructions.  Will watch your condition.  Will get help right away if you are not doing well or get worse.   This information is not intended to replace advice given to you by your health care provider. Make sure you discuss any questions you have with your health care provider.   Document Released: 04/05/2003 Document Revised: 02/03/2014 Document Reviewed: 08/05/2012 Elsevier Interactive Patient Education 2016 [employer]  Contusion A contusion is a deep bruise. Contusions are the result of a blunt injury to tissues and muscle fibers under the  skin. The  injury causes bleeding under the skin. The skin overlying the contusion may turn blue, purple, or yellow. Minor injuries will give you a painless contusion, but more severe contusions may stay painful and swollen for a few weeks.  CAUSES  This condition is usually caused by a blow, trauma, or direct force to an area of the body. SYMPTOMS  Symptoms of this condition include:  Swelling of the injured area.  Pain and tenderness in the injured area.  Discoloration. The area may have redness and then turn blue, purple, or yellow. DIAGNOSIS  This condition is diagnosed based on a physical exam and medical history. An X-ray, CT scan, or MRI may be needed to determine if there are any associated injuries, such as broken bones (fractures). TREATMENT  Specific treatment for this condition depends on what area of the body was injured. In general, the best treatment for a contusion is resting, icing, applying pressure to (compression), and elevating the injured area. This is often called the RICE strategy. Over-the-counter anti-inflammatory medicines may also be recommended for pain control.  HOME CARE INSTRUCTIONS   Rest the injured area.  If directed, apply ice to the injured area:  Put ice in a plastic bag.  Place a towel between your skin and the bag.  Leave the ice on for 20 minutes, 2-3 times per day.  If directed, apply light compression to the injured area using an elastic bandage. Make sure the bandage is not wrapped too tightly. Remove and reapply the bandage as directed by your health care provider.  If possible, raise (elevate) the injured area above the level of your heart while you are sitting or lying down.  Take over-the-counter and prescription medicines only as told by your health care provider. SEEK MEDICAL CARE IF:  Your symptoms do not improve after several days of treatment.  Your symptoms get worse.  You have difficulty moving the injured area. SEEK  IMMEDIATE MEDICAL CARE IF:   You have severe pain.  You have numbness in a hand or foot.  Your hand or foot turns pale or cold.   This information is not intended to replace advice given to you by your health care provider. Make sure you discuss any questions you have with your health care provider.   Document Released: 10/23/2004 Document Revised: 10/04/2014 Document Reviewed: 05/31/2014 Elsevier Interactive Patient Education 2016 [employer].    IF you received an x-ray today, you will receive an invoice from [facility]. Please contact [facility] at [phone] with questions or concerns regarding your invoice.   IF you received labwork today, you will receive an invoice from [employer]. Please contact [facility] at [phone] with questions or concerns regarding your invoice.   Our billing staff will not be able to assist you with questions regarding bills from these companies.  You will be contacted with the lab results as soon as they are available. The fastest way to get your results is to activate your My Chart account. Instructions are located on the last page of this paperwork. If you have not heard from [place] regarding the results in 2 weeks, please contact this office.       I personally performed the services described in this documentation, which was scribed in my presence. The recorded information has been reviewed and considered, and addended by me as needed.   Signed,   Meredith Staggers, MD [facility].  10/11/15 8:20 AM

## 2015-10-09 NOTE — Patient Instructions (Addendum)
Tylenol is safer than Advil for now. Ice to affected areas on and off for 10 minutes at a time as needed.  Wrist brace as needed until follow-up in 2 days.   Cognitive rest for the next few days, including avoiding electronic media as much as possible. Return to the clinic or go to the nearest emergency room if any of your symptoms worsen or new symptoms occur.    Concussion, Adult A concussion, or closed-head injury, is a brain injury caused by a direct blow to the head or by a quick and sudden movement (jolt) of the head or neck. Concussions are usually not life-threatening. Even so, the effects of a concussion can be serious. If you have had a concussion before, you are more likely to experience concussion-like symptoms after a direct blow to the head.  CAUSES  Direct blow to the head, such as from running into another player during a soccer game, being hit in a fight, or hitting your head on a hard surface.  A jolt of the head or neck that causes the brain to move back and forth inside the skull, such as in a car crash. SIGNS AND SYMPTOMS The signs of a concussion can be hard to notice. Early on, they may be missed by you, family members, and health care providers. You may look fine but act or feel differently. Symptoms are usually temporary, but they may last for days, weeks, or even longer. Some symptoms may appear right away while others may not show up for hours or days. Every head injury is different. Symptoms include:  Mild to moderate headaches that will not go away.  A feeling of pressure inside your head.  Having more trouble than usual:  Learning or remembering things you have heard.  Answering questions.  Paying attention or concentrating.  Organizing daily tasks.  Making decisions and solving problems.  Slowness in thinking, acting or reacting, speaking, or reading.  Getting lost or being easily confused.  Feeling tired all the time or lacking energy  (fatigued).  Feeling drowsy.  Sleep disturbances.  Sleeping more than usual.  Sleeping less than usual.  Trouble falling asleep.  Trouble sleeping (insomnia).  Loss of balance or feeling lightheaded or dizzy.  Nausea or vomiting.  Numbness or tingling.  Increased sensitivity to:  Sounds.  Lights.  Distractions.  Vision problems or eyes that tire easily.  Diminished sense of taste or smell.  Ringing in the ears.  Mood changes such as feeling sad or anxious.  Becoming easily irritated or angry for little or no reason.  Lack of motivation.  Seeing or hearing things other people do not see or hear (hallucinations). DIAGNOSIS Your health care provider can usually diagnose a concussion based on a description of your injury and symptoms. He or she will ask whether you passed out (lost consciousness) and whether you are having trouble remembering events that happened right before and during your injury. Your evaluation might include:  A brain scan to look for signs of injury to the brain. Even if the test shows no injury, you may still have a concussion.  Blood tests to be sure other problems are not present. TREATMENT  Concussions are usually treated in an emergency department, in urgent care, or at a clinic. You may need to stay in the hospital overnight for further treatment.  Tell your health care provider if you are taking any medicines, including prescription medicines, over-the-counter medicines, and natural remedies. Some medicines, such as blood  thinners (anticoagulants) and aspirin, may increase the chance of complications. Also tell your health care provider whether you have had alcohol or are taking illegal drugs. This information may affect treatment.  Your health care provider will send you home with important instructions to follow.  How fast you will recover from a concussion depends on many factors. These factors include how severe your concussion is,  what part of your brain was injured, your age, and how healthy you were before the concussion.  Most people with mild injuries recover fully. Recovery can take time. In general, recovery is slower in older persons. Also, persons who have had a concussion in the past or have other medical problems may find that it takes longer to recover from their current injury. HOME CARE INSTRUCTIONS General Instructions  Carefully follow the directions your health care provider gave you.  Only take over-the-counter or prescription medicines for pain, discomfort, or fever as directed by your health care provider.  Take only those medicines that your health care provider has approved.  Do not drink alcohol until your health care provider says you are well enough to do so. Alcohol and certain other drugs may slow your recovery and can put you at risk of further injury.  If it is harder than usual to remember things, write them down.  If you are easily distracted, try to do one thing at a time. For example, do not try to watch TV while fixing dinner.  Talk with family members or close friends when making important decisions.  Keep all follow-up appointments. Repeated evaluation of your symptoms is recommended for your recovery.  Watch your symptoms and tell others to do the same. Complications sometimes occur after a concussion. Older adults with a brain injury may have a higher risk of serious complications, such as a blood clot on the brain.  Tell your teachers, school nurse, school counselor, coach, athletic trainer, or work Production designer, theatre/television/film about your injury, symptoms, and restrictions. Tell them about what you can or cannot do. They should watch for:  Increased problems with attention or concentration.  Increased difficulty remembering or learning new information.  Increased time needed to complete tasks or assignments.  Increased irritability or decreased ability to cope with stress.  Increased  symptoms.  Rest. Rest helps the brain to heal. Make sure you:  Get plenty of sleep at night. Avoid staying up late at night.  Keep the same bedtime hours on weekends and weekdays.  Rest during the day. Take daytime naps or rest breaks when you feel tired.  Limit activities that require a lot of thought or concentration. These include:  Doing homework or job-related work.  Watching TV.  Working on the computer.  Avoid any situation where there is potential for another head injury (football, hockey, soccer, basketball, martial arts, downhill snow sports and horseback riding). Your condition will get worse every time you experience a concussion. You should avoid these activities until you are evaluated by the appropriate follow-up health care providers. Returning To Your Regular Activities You will need to return to your normal activities slowly, not all at once. You must give your body and brain enough time for recovery.  Do not return to sports or other athletic activities until your health care provider tells you it is safe to do so.  Ask your health care provider when you can drive, ride a bicycle, or operate heavy machinery. Your ability to react may be slower after a brain injury. Never do these  activities if you are dizzy.  Ask your health care provider about when you can return to work or school. Preventing Another Concussion It is very important to avoid another brain injury, especially before you have recovered. In rare cases, another injury can lead to permanent brain damage, brain swelling, or death. The risk of this is greatest during the first 7-10 days after a head injury. Avoid injuries by:  Wearing a seat belt when riding in a car.  Drinking alcohol only in moderation.  Wearing a helmet when biking, skiing, skateboarding, skating, or doing similar activities.  Avoiding activities that could lead to a second concussion, such as contact or recreational sports, until  your health care provider says it is okay.  Taking safety measures in your home.  Remove clutter and tripping hazards from floors and stairways.  Use grab bars in bathrooms and handrails by stairs.  Place non-slip mats on floors and in bathtubs.  Improve lighting in dim areas. SEEK MEDICAL CARE IF:  You have increased problems paying attention or concentrating.  You have increased difficulty remembering or learning new information.  You need more time to complete tasks or assignments than before.  You have increased irritability or decreased ability to cope with stress.  You have more symptoms than before. Seek medical care if you have any of the following symptoms for more than 2 weeks after your injury:  Lasting (chronic) headaches.  Dizziness or balance problems.  Nausea.  Vision problems.  Increased sensitivity to noise or light.  Depression or mood swings.  Anxiety or irritability.  Memory problems.  Difficulty concentrating or paying attention.  Sleep problems.  Feeling tired all the time. SEEK IMMEDIATE MEDICAL CARE IF:  You have severe or worsening headaches. These may be a sign of a blood clot in the brain.  You have weakness (even if only in one hand, leg, or part of the face).  You have numbness.  You have decreased coordination.  You vomit repeatedly.  You have increased sleepiness.  One pupil is larger than the other.  You have convulsions.  You have slurred speech.  You have increased confusion. This may be a sign of a blood clot in the brain.  You have increased restlessness, agitation, or irritability.  You are unable to recognize people or places.  You have neck pain.  It is difficult to wake you up.  You have unusual behavior changes.  You lose consciousness. MAKE SURE YOU:  Understand these instructions.  Will watch your condition.  Will get help right away if you are not doing well or get worse.   This  information is not intended to replace advice given to you by your health care provider. Make sure you discuss any questions you have with your health care provider.   Document Released: 04/05/2003 Document Revised: 02/03/2014 Document Reviewed: 08/05/2012 Elsevier Interactive Patient Education 2016 [employer]  Contusion A contusion is a deep bruise. Contusions are the result of a blunt injury to tissues and muscle fibers under the skin. The injury causes bleeding under the skin. The skin overlying the contusion may turn blue, purple, or yellow. Minor injuries will give you a painless contusion, but more severe contusions may stay painful and swollen for a few weeks.  CAUSES  This condition is usually caused by a blow, trauma, or direct force to an area of the body. SYMPTOMS  Symptoms of this condition include:  Swelling of the injured area.  Pain and tenderness in the  injured area.  Discoloration. The area may have redness and then turn blue, purple, or yellow. DIAGNOSIS  This condition is diagnosed based on a physical exam and medical history. An X-ray, CT scan, or MRI may be needed to determine if there are any associated injuries, such as broken bones (fractures). TREATMENT  Specific treatment for this condition depends on what area of the body was injured. In general, the best treatment for a contusion is resting, icing, applying pressure to (compression), and elevating the injured area. This is often called the RICE strategy. Over-the-counter anti-inflammatory medicines may also be recommended for pain control.  HOME CARE INSTRUCTIONS   Rest the injured area.  If directed, apply ice to the injured area:  Put ice in a plastic bag.  Place a towel between your skin and the bag.  Leave the ice on for 20 minutes, 2-3 times per day.  If directed, apply light compression to the injured area using an elastic bandage. Make sure the bandage is not wrapped too tightly. Remove and  reapply the bandage as directed by your health care provider.  If possible, raise (elevate) the injured area above the level of your heart while you are sitting or lying down.  Take over-the-counter and prescription medicines only as told by your health care provider. SEEK MEDICAL CARE IF:  Your symptoms do not improve after several days of treatment.  Your symptoms get worse.  You have difficulty moving the injured area. SEEK IMMEDIATE MEDICAL CARE IF:   You have severe pain.  You have numbness in a hand or foot.  Your hand or foot turns pale or cold.   This information is not intended to replace advice given to you by your health care provider. Make sure you discuss any questions you have with your health care provider.   Document Released: 10/23/2004 Document Revised: 10/04/2014 Document Reviewed: 05/31/2014 Elsevier Interactive Patient Education 2016 [employer]    IF you received an x-ray today, you will receive an invoice from [facility]. Please contact [facility] at [phone] with questions or concerns regarding your invoice.   IF you received labwork today, you will receive an invoice from [facility]. Please contact [facility] at [phone] with questions or concerns regarding your invoice.   Our billing staff will not be able to assist you with questions regarding bills from these companies.  You will be contacted with the lab results as soon as they are available. The fastest way to get your results is to activate your My Chart account. Instructions are located on the last page of this paperwork. If you have not heard from us regarding the results in 2 weeks, please contact this office.

## 2015-10-11 ENCOUNTER — Other Ambulatory Visit: Payer: Self-pay

## 2015-10-19 DIAGNOSIS — Z6828 Body mass index (BMI) 28.0-28.9, adult: Secondary | ICD-10-CM | POA: Diagnosis not present

## 2015-10-19 DIAGNOSIS — Z01419 Encounter for gynecological examination (general) (routine) without abnormal findings: Secondary | ICD-10-CM | POA: Diagnosis not present

## 2015-10-19 DIAGNOSIS — Z1231 Encounter for screening mammogram for malignant neoplasm of breast: Secondary | ICD-10-CM | POA: Diagnosis not present

## 2015-11-12 ENCOUNTER — Ambulatory Visit (INDEPENDENT_AMBULATORY_CARE_PROVIDER_SITE_OTHER): Payer: 59 | Admitting: Physician Assistant

## 2015-11-12 DIAGNOSIS — Z23 Encounter for immunization: Secondary | ICD-10-CM

## 2015-11-27 ENCOUNTER — Encounter: Payer: Self-pay | Admitting: Gastroenterology

## 2015-12-06 MED FILL — MEDROXYPROGESTERONE 2.5 MG: 2.5 | 90 days supply | Qty: 90 | Fill #0

## 2015-12-06 MED FILL — ESTRADIOL 1 MG TABLET: 1 | 90 days supply | Qty: 90 | Fill #0

## 2016-01-25 ENCOUNTER — Ambulatory Visit (AMBULATORY_SURGERY_CENTER): Payer: Self-pay | Admitting: *Deleted

## 2016-01-25 VITALS — Ht 66.5 in | Wt 183.6 lb

## 2016-01-25 DIAGNOSIS — Z1211 Encounter for screening for malignant neoplasm of colon: Secondary | ICD-10-CM

## 2016-01-25 MED ORDER — NA SULFATE-K SULFATE-MG SULF 17.5-3.13-1.6 GM/177ML PO SOLN: 1.0000 | Freq: Once | ORAL | 0 refills | Status: AC

## 2016-01-25 NOTE — Progress Notes (Signed)
No allergies to eggs or soy. No problems with anesthesia.  Pt given Emmi instructions for colonoscopy  No oxygen use  No diet drug use  

## 2016-01-30 ENCOUNTER — Telehealth: Payer: Self-pay | Admitting: Gastroenterology

## 2016-01-30 DIAGNOSIS — Z1211 Encounter for screening for malignant neoplasm of colon: Secondary | ICD-10-CM

## 2016-01-30 MED ORDER — NA SULFATE-K SULFATE-MG SULF 17.5-3.13-1.6 GM/177ML PO SOLN: 1.0000 | Freq: Once | ORAL | 0 refills | Status: AC

## 2016-01-30 MED FILL — SUPREP BOWEL PREP KIT: 17.5-3.13-1 | 1 days supply | Qty: 354 | Fill #0

## 2016-01-30 NOTE — Telephone Encounter (Signed)
Attempted to return pt' s phone call. Pt did not answer. LM to return call to [phone].

## 2016-01-30 NOTE — Telephone Encounter (Signed)
Pt returned call. Rx [facility]. Multiple other questions answered and reassurance given.

## 2016-02-11 ENCOUNTER — Ambulatory Visit (AMBULATORY_SURGERY_CENTER): Payer: 59 | Admitting: Gastroenterology

## 2016-02-11 ENCOUNTER — Encounter: Payer: Self-pay | Admitting: Gastroenterology

## 2016-02-11 VITALS — BP 104/51 | HR 58 | Temp 96.2°F | Resp 16 | Ht 66.5 in | Wt 183.0 lb

## 2016-02-11 DIAGNOSIS — K589 Irritable bowel syndrome without diarrhea: Secondary | ICD-10-CM | POA: Diagnosis not present

## 2016-02-11 DIAGNOSIS — Z1212 Encounter for screening for malignant neoplasm of rectum: Secondary | ICD-10-CM | POA: Diagnosis not present

## 2016-02-11 DIAGNOSIS — Z1211 Encounter for screening for malignant neoplasm of colon: Secondary | ICD-10-CM | POA: Diagnosis present

## 2016-02-11 MED ORDER — SODIUM CHLORIDE 0.9 % IV SOLN: 500.0000 mL | INTRAVENOUS | Status: DC

## 2016-02-11 NOTE — Op Note (Signed)
[facility] Patient Name: Nazli Penn Procedure Date: 02/11/2016 9:01 AM MRN: [id number] Endoscopist: Rachael Fee , MD Age: 56 Referring MD:  Date of Birth: 02-Mar-1960 Gender: Female Account #: [id number] Procedure:                Colonoscopy Indications:              Screening for colorectal malignant neoplasm Medicines:                Monitored Anesthesia Care Procedure:                Pre-Anesthesia Assessment:                           - Prior to the procedure, a History and Physical                            was performed, and patient medications and                            allergies were reviewed. The patient's tolerance of                            previous anesthesia was also reviewed. The risks                            and benefits of the procedure and the sedation                            options and risks were discussed with the patient.                            All questions were answered, and informed consent                            was obtained. Prior Anticoagulants: The patient has                            taken no previous anticoagulant or antiplatelet                            agents. ASA Grade Assessment: II - A patient with                            mild systemic disease. After reviewing the risks                            and benefits, the patient was deemed in                            satisfactory condition to undergo the procedure.                           After obtaining informed consent, the colonoscope  was passed under direct vision. Throughout the                            procedure, the patient's blood pressure, pulse, and                            oxygen saturations were monitored continuously. The                            Model CF-H180AL [id number]) scope was introduced                            through the anus and advanced to the the cecum,                            identified by  appendiceal orifice and ileocecal                            valve. The colonoscopy was performed without                            difficulty. The patient tolerated the procedure                            well. The quality of the bowel preparation was                            excellent. The ileocecal valve, appendiceal                            orifice, and rectum were photographed. Scope In: 9:10:23 AM Scope Out: 9:19:37 AM Scope Withdrawal Time: 0 hours 6 minutes 0 seconds  Total Procedure Duration: 0 hours 9 minutes 14 seconds  Findings:                 The entire examined colon appeared normal on direct                            and retroflexion views. Complications:            No immediate complications. Estimated blood loss:                            None. Estimated Blood Loss:     Estimated blood loss: none. Impression:               - The entire examined colon is normal on direct and                            retroflexion views.                           - No polyps or cancers Recommendation:           - Patient has a contact number available for  emergencies. The signs and symptoms of potential                            delayed complications were discussed with the                            patient. Return to normal activities tomorrow.                            Written discharge instructions were provided to the                            patient.                           - Resume previous diet.                           - Continue present medications.                           - Repeat colonoscopy in 10 years for screening                            purposes. Rachael Feeaniel P Belkys Henault, MD 02/11/2016 9:23:10 AM This report has been signed electronically.

## 2016-02-11 NOTE — Progress Notes (Signed)
Report given to PACU RN, vss 

## 2016-02-11 NOTE — Patient Instructions (Signed)
YOU HAD AN ENDOSCOPIC PROCEDURE TODAY AT THE [facility]:   Refer to the procedure report that was given to you for any specific questions about what was found during the examination.  If the procedure report does not answer your questions, please call your gastroenterologist to clarify.  If you requested that your care partner not be given the details of your procedure findings, then the procedure report has been included in a sealed envelope for you to review at your convenience later.  YOU SHOULD EXPECT: Some feelings of bloating in the abdomen. Passage of more gas than usual.  Walking can help get rid of the air that was put into your GI tract during the procedure and reduce the bloating. If you had a lower endoscopy (such as a colonoscopy or flexible sigmoidoscopy) you may notice spotting of blood in your stool or on the toilet paper. If you underwent a bowel prep for your procedure, you may not have a normal bowel movement for a few days.  Please Note:  You might notice some irritation and congestion in your nose or some drainage.  This is from the oxygen used during your procedure.  There is no need for concern and it should clear up in a day or so.  SYMPTOMS TO REPORT IMMEDIATELY:   Following lower endoscopy (colonoscopy or flexible sigmoidoscopy):  Excessive amounts of blood in the stool  Significant tenderness or worsening of abdominal pains  Swelling of the abdomen that is new, acute  Fever of 100F or higher   For urgent or emergent issues, a gastroenterologist can be reached at any hour by calling [phone].   DIET:  We do recommend a small meal at first, but then you may proceed to your regular diet.  Drink plenty of fluids but you should avoid alcoholic beverages for 24 hours.  ACTIVITY:  You should plan to take it easy for the rest of today and you should NOT DRIVE or use heavy machinery until tomorrow (because of the sedation medicines used during the test).     FOLLOW UP: Our staff will call the number listed on your records the next business day following your procedure to check on you and address any questions or concerns that you may have regarding the information given to you following your procedure. If we do not reach you, we will leave a message.  However, if you are feeling well and you are not experiencing any problems, there is no need to return our call.  We will assume that you have returned to your regular daily activities without incident.  If any biopsies were taken you will be contacted by phone or by letter within the next 1-3 weeks.  Please call us at [phone] if you have not heard about the biopsies in 3 weeks.    SIGNATURES/CONFIDENTIALITY: You and/or your care partner have signed paperwork which will be entered into your electronic medical record.  These signatures attest to the fact that that the information above on your After Visit Summary has been reviewed and is understood.  Full responsibility of the confidentiality of this discharge information lies with you and/or your care-partner.  Thank-you for choosing us for your healthcare needs today. 

## 2016-02-12 ENCOUNTER — Telehealth: Payer: Self-pay | Admitting: Gastroenterology

## 2016-02-12 ENCOUNTER — Telehealth: Payer: Self-pay | Admitting: *Deleted

## 2016-02-12 ENCOUNTER — Telehealth: Payer: Self-pay

## 2016-02-12 NOTE — Telephone Encounter (Signed)
  Follow up Call-  Call back number 02/11/2016  Post procedure Call Back phone  # [phone]  Permission to leave phone message Yes  Some recent data might be hidden     Patient questions:  Do you have a fever, pain , or abdominal swelling? No. Pain Score  0 *  Have you tolerated food without any problems? Yes.    Have you been able to return to your normal activities? Yes.    Do you have any questions about your discharge instructions: Diet   No. Medications  No. Follow up visit  No.  Do you have questions or concerns about your Care? No.  Actions: * If pain score is 4 or above: No action needed, pain <4.

## 2016-02-12 NOTE — Telephone Encounter (Signed)
  Follow up Call-  Call back number 02/11/2016  Post procedure Call Back phone  # [phone]  Permission to leave phone message Yes  Some recent data might be hidden     Patient questions:  Message left to call us if necessary.

## 2016-02-15 NOTE — Telephone Encounter (Signed)
Left message on machine to call back  

## 2016-02-20 NOTE — Telephone Encounter (Signed)
Left message on machine to call back  

## 2016-02-20 NOTE — Telephone Encounter (Signed)
Pt states she no longer has any problems and will call back if symptoms return

## 2016-02-21 ENCOUNTER — Ambulatory Visit (INDEPENDENT_AMBULATORY_CARE_PROVIDER_SITE_OTHER): Payer: 59 | Admitting: Medical

## 2016-02-21 VITALS — BP 91/58 | HR 91 | Temp 98.2°F | Ht 66.0 in | Wt 185.5 lb

## 2016-02-21 DIAGNOSIS — J111 Influenza due to unidentified influenza virus with other respiratory manifestations: Secondary | ICD-10-CM | POA: Diagnosis not present

## 2016-02-21 DIAGNOSIS — J0100 Acute maxillary sinusitis, unspecified: Secondary | ICD-10-CM | POA: Diagnosis not present

## 2016-02-21 LAB — POC INFLUENZA A&B (BINAX/QUICKVUE)
INFLUENZA A, POC: POSITIVE — AB
INFLUENZA B, POC: NEGATIVE

## 2016-02-21 MED ORDER — AZITHROMYCIN 250 MG PO TABS: ORAL_TABLET | ORAL | 0 refills | Status: DC

## 2016-02-21 MED ORDER — FLUTICASONE PROPIONATE 50 MCG/ACT NA SUSP: 2.0000 | Freq: Every day | NASAL | 1 refills | Status: DC

## 2016-02-21 MED FILL — AZITHROMYCIN 250 MG TABLET: 250 | 5 days supply | Qty: 6 | Fill #0

## 2016-02-21 MED FILL — FLUTICASONE PROP 50 MCG SPR: 50 | 30 days supply | Qty: 16 | Fill #0

## 2016-02-21 NOTE — Progress Notes (Signed)
Subjective:    Patient ID: Tracy Larsen, female    DOB: 04/15/1960, 56 y.o.   MRN: [id number]  HPI  Pt has rapid acute onset of illness 2  days ago. Reports  Faint scratcy throat, fever, body aches, fatigue , ,runny nose, dry cough, and some sore throat. Pt has hx of moderate to severe illness with flu in distant past.. No wheezing recently. No chest congestion. Pt has off and on sinus pain.  She states symptoms rapid worsened yesterday. Body aches, ha arose that first day. She had brief severe ha last night and took tylenol. HA went away quickly.   Pt has 2 coworkers who had flu like symptoms.    Pt tried mucinex and corcidin.   Pt states years ago she had a lot of side effects with tamiflu. So severe that she does not want to be on.   Pt on review denies tia history.  Pt bp always on the low side.    Review of Systems  Constitutional: Positive for chills, fatigue and fever.  HENT: Positive for congestion, sinus pain and sinus pressure. Negative for postnasal drip, sore throat and trouble swallowing.   Respiratory: Negative for cough, shortness of breath and wheezing.   Cardiovascular: Negative for chest pain and palpitations.  Gastrointestinal: Negative for abdominal pain, constipation, diarrhea, nausea and vomiting.  Musculoskeletal: Negative for back pain.  Neurological: Negative for dizziness, seizures, weakness, light-headedness and headaches.       Ha last night in the past.  Hematological: Negative for adenopathy. Does not bruise/bleed easily.  Psychiatric/Behavioral: Negative for behavioral problems, confusion, sleep disturbance and suicidal ideas. The patient is not nervous/anxious.     Past Medical History:  Diagnosis Date  . Allergy   . Arthritis   . Asthma   . Endometriosis   . GERD (gastroesophageal reflux disease)   . Herpes   . HSV (herpes simplex virus) infection   . IBS (irritable bowel syndrome)   . Insomnia   . Mitral valve prolapse        Social History   Social History  . Marital status: Married    Spouse name: N/A  . Number of children: 1  . Years of education: N/A   Occupational History  . [employer]   Social History Main Topics  . Smoking status: Former Smoker    Quit date: 01/27/2001  . Smokeless tobacco: Never Used  . Alcohol use 0.6 oz/week    1 Glasses of wine per week  . Drug use: No  . Sexual activity: Yes    Birth control/ protection: None   Other Topics Concern  . Not on file   Social History Narrative   Lives at home w/ husband and daughter and grandson   Right-handed   Drinks 2 glasses of tea daily       Past Surgical History:  Procedure Laterality Date  . BLADDER REPAIR     AGE 56  . NASAL SINUS SURGERY    . PELVIC LAPAROSCOPY     X 5  . URETHRAL STRICTURE DILATATION      Family History  Problem Relation Age of Onset  . Cancer Mother     VULVAR CANCER  . Heart disease Father     MI in his 1460s  . Cancer Brother     SARCOMA  . Heart disease Brother     WPW  . Diabetes Paternal Grandmother   . Colon cancer Neg Hx  Allergies  Allergen Reactions  . Augmentin [Amoxicillin-Pot Clavulanate] Shortness Of Breath  . Avelox [Moxifloxacin Hcl In Nacl] Shortness Of Breath and Palpitations  . Levofloxacin Shortness Of Breath and Palpitations  . Entex Other (See Comments)    Tongue peeling   . Erythromycin Diarrhea  . Promethazine Other (See Comments)    Made butt feel like it was on fire -- Suppository    . Stadol [Butorphanol] Other (See Comments)    dizziness  . Tamiflu [Oseltamivir Phosphate] Nausea And Vomiting  . Zofran [Ondansetron Hcl] Nausea Only and Other (See Comments)    Makes nausea much worse and dizziness   . Sulfa Antibiotics Rash  . Tetracyclines & Related Rash    Current Outpatient Prescriptions on File Prior to Visit  Medication Sig Dispense Refill  . acyclovir (ZOVIRAX) 400 MG tablet Take 400 mg by mouth 2 (two) times  daily. Uses as needed    . estradiol (ESTRACE) 1 MG tablet Take 1 mg by mouth daily.    . Fluticasone Propionate (FLONASE NA) Place into the nose.    . ibuprofen (ADVIL,MOTRIN) 200 MG tablet Take 400 mg by mouth every 6 (six) hours as needed for moderate pain.    . medroxyPROGESTERone (PROVERA) 2.5 MG tablet Take 2.5 mg by mouth daily.    . Multiple Vitamin (MULTIVITAMIN) tablet Take 1 tablet by mouth daily.     Current Facility-Administered Medications on File Prior to Visit  Medication Dose Route Frequency Provider Last Rate Last Dose  . 0.9 %  sodium chloride infusion  500 mL Intravenous Continuous Rachael Fee, MD        BP (!) 91/58   Pulse 91   Temp 98.2 F (36.8 C) (Oral)   Ht 5\' 6"  (1.676 m)   Wt 185 lb 8 oz (84.1 kg)   LMP 10/15/2011   SpO2 100%   BMI 29.94 kg/m       Objective:   Physical Exam  General  Mental Status - Alert. General Appearance - Well groomed. Not in acute distress.  Skin Rashes- No Rashes.  HEENT Head- Normal. Ear Auditory Canal - Left- Normal. Right - Normal.Tympanic Membrane- Left- Normal. Right- Normal. Eye Sclera/Conjunctiva- Left- Normal. Right- Normal. Nose & Sinuses Nasal Mucosa- Left-  Boggy and Congested. Right-  Boggy and  Congested.Bilateral faint maxillary and faint frontal sinus pressure. Mouth & Throat Lips: Upper Lip- Normal: no dryness, cracking, pallor, cyanosis, or vesicular eruption. Lower Lip-Normal: no dryness, cracking, pallor, cyanosis or vesicular eruption. Buccal Mucosa- Bilateral- No Aphthous ulcers. Oropharynx- No Discharge or Erythema. Tonsils: Characteristics- Bilateral- No Erythema or Congestion. Size/Enlargement- Bilateral- No enlargement. Discharge- bilateral-None.  Neck Neck- Supple. No Masses.   Chest and Lung Exam Auscultation: Breath Sounds:-Clear even and unlabored.  Cardiovascular Auscultation:Rythm- Regular, rate and rhythm. Murmurs & Other Heart Sounds:Ausculatation of the heart reveal- No  Murmurs.  Lymphatic Head & Neck General Head & Neck Lymphatics: Bilateral: Description- No Localized lymphadenopathy.   General Mental Status- Alert. General Appearance- Not in acute distress.   Skin General: Color- Normal Color. Moisture- Normal Moisture.  Neck Carotid Arteries- Normal color. Moisture- Normal Moisture. No carotid bruits. No JVD.  Chest and Lung Exam Auscultation: Breath Sounds:-Normal.  Cardiovascular Auscultation:Rythm- Regular. Murmurs & Other Heart Sounds:Auscultation of the heart reveals- No Murmurs.  Abdomen Inspection:-Inspeection Normal. Palpation/Percussion:Note:No mass. Palpation and Percussion of the abdomen reveal- Non Tender, Non Distended + BS, no rebound or guarding.  Neurologic Cranial Nerve exam:- CN III-XII intact(No nystagmus), symmetric smile. Drift Test:-  No drift. Romberg Exam:- Negative.  Finger to Nose:- Normal/Intact Strength:- 5/5 equal and symmetric strength both upper and lower extremities.      Assessment & Plan:  You have the flu. You declined tx with tamiflu and declined other options as well . Rest, hydrate and take tylenol for fever. Alternate ibuprofen as discussed  if necessary for body ache or fever. Will rx azithromycin for secondary sinus infection. flonase for nasal congestion.  Please make sure you stay hydrate and eat even if no appetite.(Due to baseline low bp need to be cautious)  Rare complications of flu can occur  If your symptoms worsen severely then ED evaluation.   Follow up as well her in 5-7 days or as needed if you feel not recovering adequately.  Eren Ryser, Ramon Dredge, PA-C

## 2016-02-21 NOTE — Patient Instructions (Addendum)
You have the flu. You declined tx with tamiflu and declined other options as well . Rest, hydrate and take tylenol for fever. Alternate ibuprofen as discussed  if necessary for body ache or fever. Will rx azithromycin for secondary sinus infection. Flonase for nasal congestion.  Please make sure you stay hydrate and eat even if no appetite.(Due to baseline low bp need to be cautious)  Rare complications of flu can occur  If your symptoms worsen severely then ED evaluation.   Follow up as well her in 5-7 days or as needed if you feel not recovering adequately.

## 2016-02-22 ENCOUNTER — Telehealth: Payer: Self-pay | Admitting: Family Medicine

## 2016-02-22 NOTE — Telephone Encounter (Signed)
Caller name: Relationship to patient: Self Can be reached: [phone] Pharmacy:  Reason for call: Patient states that she took the antibiotic that was given to her yesterday by Esperanza RichtersEdward Saguier for positive flu. States that because she has IBS she can not take the antibiotic. States it has caused her to have diarrhea. Plse adv.

## 2016-02-22 NOTE — Telephone Encounter (Signed)
Called pt after hours no answer. Left message to call back as not clear cut what she can take for sinus infection in light of her extensvie allergy history(loose stools now with zpack). So left my cell phone number and asked her to call me. Also will send this message to staff member so she can call pt and see how she is on Monday.  Could not call during day due to being busy all day and lunch meeting. Called after hours.

## 2016-02-23 ENCOUNTER — Emergency Department (HOSPITAL_BASED_OUTPATIENT_CLINIC_OR_DEPARTMENT_OTHER): Payer: 59

## 2016-02-23 ENCOUNTER — Emergency Department (HOSPITAL_BASED_OUTPATIENT_CLINIC_OR_DEPARTMENT_OTHER)
Admission: EM | Admit: 2016-02-23 | Discharge: 2016-02-23 | Disposition: A | Discharge status: Home / Self Care | Payer: 59 | Attending: Emergency Medicine | Admitting: Emergency Medicine

## 2016-02-23 ENCOUNTER — Encounter (HOSPITAL_BASED_OUTPATIENT_CLINIC_OR_DEPARTMENT_OTHER): Payer: Self-pay | Admitting: *Deleted

## 2016-02-23 DIAGNOSIS — J45909 Unspecified asthma, uncomplicated: Secondary | ICD-10-CM | POA: Diagnosis not present

## 2016-02-23 DIAGNOSIS — Z87891 Personal history of nicotine dependence: Secondary | ICD-10-CM | POA: Diagnosis not present

## 2016-02-23 DIAGNOSIS — Z7982 Long term (current) use of aspirin: Secondary | ICD-10-CM | POA: Diagnosis not present

## 2016-02-23 DIAGNOSIS — J111 Influenza due to unidentified influenza virus with other respiratory manifestations: Secondary | ICD-10-CM | POA: Diagnosis not present

## 2016-02-23 DIAGNOSIS — E860 Dehydration: Secondary | ICD-10-CM | POA: Diagnosis not present

## 2016-02-23 DIAGNOSIS — R05 Cough: Secondary | ICD-10-CM | POA: Diagnosis not present

## 2016-02-23 HISTORY — DX: Urinary tract infection, site not specified: N39.0

## 2016-02-23 LAB — TROPONIN I: Troponin I: <0.03 ng/mL (ref <0.03)

## 2016-02-23 LAB — COMPREHENSIVE METABOLIC PANEL
ALT: 36 U/L (ref 14–54)
AST: 52 U/L — AB (ref 15–41)
Albumin: 3.8 g/dL (ref 3.5–5.0)
Alkaline Phosphatase: 82 U/L (ref 38–126)
Anion gap: 9 (ref 5–15)
BUN: 9 mg/dL (ref 6–20)
CHLORIDE: 104 mmol/L (ref 101–111)
CO2: 25 mmol/L (ref 22–32)
Calcium: 8.6 mg/dL — ABNORMAL LOW (ref 8.9–10.3)
Creatinine, Ser: 0.92 mg/dL (ref 0.44–1.00)
GFR calc Af Amer: >60 mL/min (ref >60)
GFR calc non Af Amer: >60 mL/min (ref >60)
Glucose, Bld: 89 mg/dL (ref 65–99)
POTASSIUM: 3.6 mmol/L (ref 3.5–5.1)
SODIUM: 138 mmol/L (ref 135–145)
Total Bilirubin: 0.5 mg/dL (ref 0.3–1.2)
Total Protein: 7.6 g/dL (ref 6.5–8.1)

## 2016-02-23 LAB — URINALYSIS, ROUTINE W REFLEX MICROSCOPIC
BILIRUBIN URINE: NEGATIVE
Glucose, UA: NEGATIVE mg/dL
Ketones, ur: 40 mg/dL — AB
Leukocytes, UA: NEGATIVE
Nitrite: NEGATIVE
PH: 6 (ref 5.0–8.0)
Protein, ur: NEGATIVE mg/dL
SPECIFIC GRAVITY, URINE: 1.018 (ref 1.005–1.030)

## 2016-02-23 LAB — URINALYSIS, MICROSCOPIC (REFLEX)

## 2016-02-23 LAB — CBC WITH DIFFERENTIAL/PLATELET
BASOS ABS: 0 10*3/uL (ref 0.0–0.1)
Basophils Relative: 0 %
EOS ABS: 0 10*3/uL (ref 0.0–0.7)
EOS PCT: 0 %
HCT: 43.1 % (ref 36.0–46.0)
Hemoglobin: 14.2 g/dL (ref 12.0–15.0)
Lymphocytes Relative: 27 %
Lymphs Abs: 1.1 10*3/uL (ref 0.7–4.0)
MCH: 29.6 pg (ref 26.0–34.0)
MCHC: 32.9 g/dL (ref 30.0–36.0)
MCV: 89.8 fL (ref 78.0–100.0)
Monocytes Absolute: 0.6 10*3/uL (ref 0.1–1.0)
Monocytes Relative: 14 %
Neutro Abs: 2.4 10*3/uL (ref 1.7–7.7)
Neutrophils Relative %: 59 %
PLATELETS: 170 10*3/uL (ref 150–400)
RBC: 4.8 MIL/uL (ref 3.87–5.11)
RDW: 12.7 % (ref 11.5–15.5)
WBC: 4.1 10*3/uL (ref 4.0–10.5)

## 2016-02-23 MED ORDER — SODIUM CHLORIDE 0.9 % IV BOLUS (SEPSIS)
1000.0000 mL | Freq: Once | INTRAVENOUS | Status: AC
  Administered 2016-02-23: 1000 mL via INTRAVENOUS

## 2016-02-23 MED ORDER — ACETAMINOPHEN 325 MG PO TABS
650.0000 mg | ORAL_TABLET | Freq: Once | ORAL | Status: AC
  Administered 2016-02-23: 650 mg via ORAL
  Filled 2016-02-23: qty 2

## 2016-02-23 MED ORDER — METOCLOPRAMIDE HCL 10 MG PO TABS: 10.0000 mg | ORAL_TABLET | Freq: Four times a day (QID) | ORAL | 0 refills | Status: DC | PRN

## 2016-02-23 MED ORDER — HYDROCODONE-HOMATROPINE 5-1.5 MG/5ML PO SYRP
5.0000 mL | ORAL_SOLUTION | Freq: Once | ORAL | Status: DC
  Filled 2016-02-23: qty 5

## 2016-02-23 MED ORDER — BENZONATATE 100 MG PO CAPS: 100.0000 mg | ORAL_CAPSULE | Freq: Three times a day (TID) | ORAL | 0 refills | Status: DC

## 2016-02-23 MED ORDER — HYDROCODONE-HOMATROPINE 5-1.5 MG/5ML PO SYRP: 5.0000 mL | ORAL_SOLUTION | Freq: Four times a day (QID) | ORAL | 0 refills | Status: DC | PRN

## 2016-02-23 MED ORDER — METOCLOPRAMIDE HCL 5 MG/ML IJ SOLN
10.0000 mg | Freq: Once | INTRAMUSCULAR | Status: DC
  Filled 2016-02-23: qty 2

## 2016-02-23 MED ORDER — DIPHENHYDRAMINE HCL 50 MG/ML IJ SOLN
25.0000 mg | Freq: Once | INTRAMUSCULAR | Status: AC
  Administered 2016-02-23: 25 mg via INTRAVENOUS
  Filled 2016-02-23: qty 1

## 2016-02-23 NOTE — ED Notes (Signed)
RESP 16

## 2016-02-23 NOTE — ED Notes (Signed)
Pt ambulated to and from restroom x1 man assistance.

## 2016-02-23 NOTE — ED Provider Notes (Signed)
[facility]PT Provider Note   CSN: [id number] Arrival date & time: 02/23/16  0902     History   Chief Complaint Chief Complaint  Patient presents with  . Influenza    Positive for Flu A    HPI MEMPHIS Tracy Larsen is a 56 y.o. female.   Cough  This is a new problem. The current episode started 2 days ago. The problem occurs constantly. The cough is non-productive. The maximum temperature recorded prior to her arrival was 101 to 101.9 F. The fever has been present for 1 to 2 days. Associated symptoms include chills, sweats, ear congestion, headaches, sore throat, myalgias and shortness of breath. Treatments tried: azithromycin, mucinex, chlorecidin. The treatment provided mild relief. She is not a smoker.    Past Medical History:  Diagnosis Date  . Allergy   . Arthritis    Patient denies.   . Asthma   . Endometriosis   . GERD (gastroesophageal reflux disease)   . Herpes   . HSV (herpes simplex virus) infection   . IBS (irritable bowel syndrome)   . Insomnia   . Mitral valve prolapse   . UTI (urinary tract infection)    chronic history r/t bladder reflux which was repaired,    Patient Active Problem List   Diagnosis Date Noted  . Transient visual disturbance, bilateral 07/02/2015  . IBS (irritable bowel syndrome) 04/22/2013  . Mitral valve prolapse 09/24/2012  . TIA (transient ischemic attack) 09/24/2012    Past Surgical History:  Procedure Laterality Date  . BLADDER REPAIR     AGE 37  . NASAL SINUS SURGERY    . PELVIC LAPAROSCOPY     X 5  . URETHRAL STRICTURE DILATATION      OB History    Gravida Para Term Preterm AB Living   1 1 1     1    SAB TAB Ectopic Multiple Live Births                   Home Medications    Prior to Admission medications   Medication Sig Start Date End Date Taking? Authorizing Provider  acetaminophen (TYLENOL) 325 MG tablet Take 650 mg by mouth every 6 (six) hours as needed.   Yes Historical Provider, MD  acyclovir  (ZOVIRAX) 400 MG tablet Take 400 mg by mouth 2 (two) times daily. Uses as needed 03/11/11  Yes Dara Lords, MD  azithromycin (ZITHROMAX) 250 MG tablet Take 2 tablets by mouth on day 1, followed by 1 tablet by mouth daily for 4 days. 02/21/16  Yes Edward Saguier, PA-C  DM-APAP-CPM (CORICIDIN HBP FLU PO) Take by mouth.   Yes Historical Provider, MD  estradiol (ESTRACE) 1 MG tablet Take 1 mg by mouth daily.   Yes Historical Provider, MD  GuaiFENesin (MUCINEX PO) Take by mouth.   Yes Historical Provider, MD  ibuprofen (ADVIL,MOTRIN) 200 MG tablet Take 400 mg by mouth every 6 (six) hours as needed for moderate pain.   Yes Historical Provider, MD  medroxyPROGESTERone (PROVERA) 2.5 MG tablet Take 2.5 mg by mouth daily.   Yes Historical Provider, MD  benzonatate (TESSALON) 100 MG capsule Take 1 capsule (100 mg total) by mouth every 8 (eight) hours. 02/23/16   Marily Memos, MD  fluticasone (FLONASE) 50 MCG/ACT nasal spray Place 2 sprays into both nostrils daily. 02/21/16   Ramon Dredge Saguier, PA-C  Fluticasone Propionate (FLONASE NA) Place into the nose.    Historical Provider, MD  HYDROcodone-homatropine [facility]) 5-1.5 MG/5ML syrup Take  5 mLs by mouth every 6 (six) hours as needed for cough. 02/23/16   Marily Memos, MD  metoCLOPramide (REGLAN) 10 MG tablet Take 1 tablet (10 mg total) by mouth every 6 (six) hours as needed for nausea (nausea/headache). 02/23/16   Marily Memos, MD  Multiple Vitamin (MULTIVITAMIN) tablet Take 1 tablet by mouth daily.    Historical Provider, MD    Family History Family History  Problem Relation Age of Onset  . Cancer Mother     VULVAR CANCER  . Heart disease Father     MI in his 8s  . Cancer Brother     SARCOMA  . Heart disease Brother     WPW  . Diabetes Paternal Grandmother   . Colon cancer Neg Hx     Social History Social History  Substance Use Topics  . Smoking status: Former Smoker    Quit date: 01/27/2001  . Smokeless tobacco: Never Used  . Alcohol use 0.6  oz/week    1 Glasses of wine per week     Allergies   Augmentin [amoxicillin-pot clavulanate]; Avelox [moxifloxacin hcl in nacl]; Levofloxacin; Entex; Erythromycin; Promethazine; Stadol [butorphanol]; Tamiflu [oseltamivir phosphate]; Zofran [ondansetron hcl]; Sulfa antibiotics; and Tetracyclines & related   Review of Systems Review of Systems  Constitutional: Positive for chills.  HENT: Positive for sore throat.   Respiratory: Positive for cough and shortness of breath.   Musculoskeletal: Positive for myalgias.  Neurological: Positive for headaches.  All other systems reviewed and are negative.    Physical Exam Updated Vital Signs BP 124/62   Pulse (!) 59   Temp 99.3 F (37.4 C) (Oral)   Resp 18   Ht 5\' 6"  (1.676 m)   Wt 185 lb (83.9 kg)   LMP 10/15/2011   SpO2 99%   BMI 29.86 kg/m   Physical Exam  Constitutional: She is oriented to person, place, and time. She appears well-developed and well-nourished.  HENT:  Head: Normocephalic and atraumatic.  Mouth/Throat: Mucous membranes are dry.  Eyes: Conjunctivae and EOM are normal.  Neck: Normal range of motion.  Cardiovascular: Normal rate and regular rhythm.   Pulmonary/Chest: No stridor. No respiratory distress. She has no wheezes.  Abdominal: She exhibits no distension.  Musculoskeletal: Normal range of motion. She exhibits no edema or deformity.  Neurological: She is alert and oriented to person, place, and time. No cranial nerve deficit.  Nursing note and vitals reviewed.    ED Treatments / Results  Labs (all labs ordered are listed, but only abnormal results are displayed) Labs Reviewed  COMPREHENSIVE METABOLIC PANEL - Abnormal; Notable for the following:       Result Value   Calcium 8.6 (*)    AST 52 (*)    All other components within normal limits  URINALYSIS, ROUTINE W REFLEX MICROSCOPIC - Abnormal; Notable for the following:    Hgb urine dipstick SMALL (*)    Ketones, ur 40 (*)    All other  components within normal limits  URINALYSIS, MICROSCOPIC (REFLEX) - Abnormal; Notable for the following:    Bacteria, UA FEW (*)    Squamous Epithelial / LPF 0-5 (*)    All other components within normal limits  CBC WITH DIFFERENTIAL/PLATELET  TROPONIN I    EKG  EKG Interpretation  Date/Time:  Saturday February 23 2016 11:58:50 EST Ventricular Rate:  62 PR Interval:    QRS Duration: 97 QT Interval:  411 QTC Calculation: 418 R Axis:   -48 Text Interpretation:  Sinus rhythm  Short PR interval Left anterior fascicular block RSR' in V1 or V2, probably normal variant Confirmed by [facility] MD, Sylvan Lahm [phone]([id number]) on 02/23/2016 1:36:00 PM       Radiology Dg Chest 2 View  Result Date: 02/23/2016 CLINICAL DATA:  Cough, fever for 1 week EXAM: CHEST  2 VIEW COMPARISON:  02/12/2015 FINDINGS: Lingular subsegmental atelectasis. Right lung is clear. Heart is normal size. No effusions or acute bony abnormality. IMPRESSION: Lingular/left base atelectasis. Electronically Signed   By: Charlett NoseKevin  Dover M.D.   On: 02/23/2016 11:01    Procedures Procedures (including critical care time)  Medications Ordered in ED Medications  sodium chloride 0.9 % bolus 1,000 mL (0 mLs Intravenous Stopped 02/23/16 1438)  acetaminophen (TYLENOL) tablet 650 mg (650 mg Oral Given 02/23/16 1135)  diphenhydrAMINE (BENADRYL) injection 25 mg (25 mg Intravenous Given 02/23/16 1133)  sodium chloride 0.9 % bolus 1,000 mL (0 mLs Intravenous Stopped 02/23/16 1534)     Initial Impression / Assessment and Plan / ED Course  I have reviewed the triage vital signs and the nursing notes.  Pertinent labs & imaging results that were available during my care of the patient were reviewed by me and considered in my medical decision making (see chart for details).     Likely dehydration from influenza and vomiting. Tolerating PO here. Hydrated with fluids. Patient with atelectasis in lingula, doubt PNA and is already on abx. Feels much improved  with reglan/fluids/tessalon. Will dc on same with hycodan. Bed rest for a couple days with fluids and antipyretics. pcp follow up.  Final Clinical Impressions(s) / ED Diagnoses   Final diagnoses:  Influenza  Dehydration    New Prescriptions Discharge Medication List as of 02/23/2016  3:13 PM    START taking these medications   Details  benzonatate (TESSALON) 100 MG capsule Take 1 capsule (100 mg total) by mouth every 8 (eight) hours., Starting Sat 02/23/2016, Print    HYDROcodone-homatropine (HYCODAN) 5-1.5 MG/5ML syrup Take 5 mLs by mouth every 6 (six) hours as needed for cough., Starting Sat 02/23/2016, Print    metoCLOPramide (REGLAN) 10 MG tablet Take 1 tablet (10 mg total) by mouth every 6 (six) hours as needed for nausea (nausea/headache)., Starting Sat 02/23/2016, Print         Marily MemosJason Vanna Sailer, MD 02/24/16 [phone]

## 2016-02-23 NOTE — ED Notes (Signed)
Pt on automatic VS, continuous pulse ox and cardiac monitoring. 

## 2016-02-23 NOTE — ED Triage Notes (Signed)
Patient states she was diagnosed with the Flu A on 02/21/16, and sinus infection.  Was not given Tamaflu due to an intolerance.  Was started on Azithromycin which she started on 02/21/16 and has had diarrhea since.  States she feels this is a reaction to antibiotics.  Spoke with [facility] last night and was told to come to the ed for fluids.  Patient has multiple complaints, has had a fever of unknown amount, and chills.  Non productive deep cough with occasional secretions varying from clear to green.

## 2016-02-23 NOTE — ED Notes (Signed)
Pt and husband given d/c instructions as per chart. Verbalizes understanding. No questions. Rx x 3 with precautions.

## 2016-02-25 ENCOUNTER — Encounter: Payer: Self-pay | Admitting: Medical

## 2016-02-25 ENCOUNTER — Ambulatory Visit (HOSPITAL_BASED_OUTPATIENT_CLINIC_OR_DEPARTMENT_OTHER)
Admission: RE | Admit: 2016-02-25 | Discharge: 2016-02-25 | Disposition: A | Discharge status: Home / Self Care | Payer: 59 | Source: Ambulatory Visit | Attending: Medical | Admitting: Medical

## 2016-02-25 ENCOUNTER — Ambulatory Visit (INDEPENDENT_AMBULATORY_CARE_PROVIDER_SITE_OTHER): Payer: 59 | Admitting: Medical

## 2016-02-25 VITALS — BP 104/73 | HR 66 | Temp 98.0°F | Resp 16 | Ht 66.0 in | Wt 180.4 lb

## 2016-02-25 DIAGNOSIS — J111 Influenza due to unidentified influenza virus with other respiratory manifestations: Secondary | ICD-10-CM | POA: Diagnosis not present

## 2016-02-25 DIAGNOSIS — R062 Wheezing: Secondary | ICD-10-CM | POA: Diagnosis not present

## 2016-02-25 DIAGNOSIS — R0989 Other specified symptoms and signs involving the circulatory and respiratory systems: Secondary | ICD-10-CM | POA: Diagnosis not present

## 2016-02-25 DIAGNOSIS — R059 Cough, unspecified: Secondary | ICD-10-CM

## 2016-02-25 DIAGNOSIS — J9811 Atelectasis: Secondary | ICD-10-CM | POA: Insufficient documentation

## 2016-02-25 DIAGNOSIS — R05 Cough: Secondary | ICD-10-CM | POA: Diagnosis not present

## 2016-02-25 DIAGNOSIS — R197 Diarrhea, unspecified: Secondary | ICD-10-CM

## 2016-02-25 DIAGNOSIS — R5383 Other fatigue: Secondary | ICD-10-CM

## 2016-02-25 MED ORDER — BENZONATATE 100 MG PO CAPS: 100.0000 mg | ORAL_CAPSULE | Freq: Three times a day (TID) | ORAL | 0 refills | Status: DC | PRN

## 2016-02-25 MED FILL — BENZONATATE 100 MG CAP: 100 | 7 days supply | Qty: 21 | Fill #0

## 2016-02-25 NOTE — Progress Notes (Signed)
Subjective:    Patient ID: Tracy Larsen, female    DOB: Apr 07, 1960, 56 y.o.   MRN: [id number]  HPI  Pt in for follow up. She went to the ED and was evaluated. Pt had to go to ED this weekend. She went due to dehydration. She just felt to weak(2 bags of fluids on saturday). Pt was at ED 9 am to 4 pm.  Pt states ED did not want to give her any antibiotics. Pt was given hydrocodone cough syrup and she thinks caused a rash(but no lip swelling or sob). Pt still having low grade fever up until today. Then fever broke and she feels better.   Pt is still bringing up some mucous. She brought was taken mucinex Friday until this morning.  Pt was given reglan in ED. Though she denies vomiting or nausea.   Pt feels like she has minimal wheeze over weekend.(Pt had neb treatment before and she states no side effects).  She mentioned pocket inhaler made her  very nervous and jittery.   Pt tried to use propel but she did not like taste.     Review of Systems  Constitutional: Negative for chills, fatigue and fever.  Respiratory: Positive for cough. Negative for chest tightness, shortness of breath and wheezing.        Rare  Cardiovascular: Negative for chest pain and palpitations.  Gastrointestinal: Positive for diarrhea. Negative for abdominal pain, blood in stool, nausea, rectal pain and vomiting.  Musculoskeletal: Negative for back pain, myalgias and neck stiffness.  Skin: Negative for rash.  Neurological: Negative for dizziness, weakness, light-headedness and headaches.  Hematological: Negative for adenopathy. Does not bruise/bleed easily.  Psychiatric/Behavioral: Negative for behavioral problems and confusion.    Past Medical History:  Diagnosis Date  . Allergy   . Arthritis    Patient denies.   . Asthma   . Endometriosis   . GERD (gastroesophageal reflux disease)   . Herpes   . HSV (herpes simplex virus) infection   . IBS (irritable bowel syndrome)   . Insomnia   . Mitral valve  prolapse   . UTI (urinary tract infection)    chronic history r/t bladder reflux which was repaired,     Social History   Social History  . Marital status: Married    Spouse name: N/A  . Number of children: 1  . Years of education: N/A   Occupational History  . [employer]   Social History Main Topics  . Smoking status: Former Smoker    Quit date: 01/27/2001  . Smokeless tobacco: Never Used  . Alcohol use 0.6 oz/week    1 Glasses of wine per week  . Drug use: No  . Sexual activity: Yes    Birth control/ protection: None   Other Topics Concern  . Not on file   Social History Narrative   Lives at home w/ husband and daughter and grandson   Right-handed   Drinks 2 glasses of tea daily       Past Surgical History:  Procedure Laterality Date  . BLADDER REPAIR     AGE 72  . NASAL SINUS SURGERY    . PELVIC LAPAROSCOPY     X 5  . URETHRAL STRICTURE DILATATION      Family History  Problem Relation Age of Onset  . Cancer Mother     VULVAR CANCER  . Heart disease Father     MI in his 22s  . Cancer  Brother     SARCOMA  . Heart disease Brother     WPW  . Diabetes Paternal Grandmother   . Colon cancer Neg Hx     Allergies  Allergen Reactions  . Augmentin [Amoxicillin-Pot Clavulanate] Shortness Of Breath  . Avelox [Moxifloxacin Hcl In Nacl] Shortness Of Breath and Palpitations  . Levofloxacin Shortness Of Breath and Palpitations  . Azithromycin Other (See Comments)    GI Issues/IBS Exacerbation  . Entex Other (See Comments)    Tongue peeling   . Erythromycin Diarrhea  . Promethazine Other (See Comments)    Made butt feel like it was on fire -- Suppository    . Stadol [Butorphanol] Other (See Comments)    dizziness  . Tamiflu [Oseltamivir Phosphate] Nausea And Vomiting  . Zofran [Ondansetron Hcl] Nausea Only and Other (See Comments)    Makes nausea much worse and dizziness   . Sulfa Antibiotics Rash  . Tetracyclines & Related  Rash    Current Outpatient Prescriptions on File Prior to Visit  Medication Sig Dispense Refill  . acetaminophen (TYLENOL) 325 MG tablet Take 650 mg by mouth every 6 (six) hours as needed.    Marland Kitchen acyclovir (ZOVIRAX) 400 MG tablet Take 400 mg by mouth 2 (two) times daily. Uses as needed    . benzonatate (TESSALON) 100 MG capsule Take 1 capsule (100 mg total) by mouth every 8 (eight) hours. 21 capsule 0  . DM-APAP-CPM (CORICIDIN HBP FLU PO) Take by mouth.    . estradiol (ESTRACE) 1 MG tablet Take 1 mg by mouth daily.    . fluticasone (FLONASE) 50 MCG/ACT nasal spray Place 2 sprays into both nostrils daily. 16 g 1  . Fluticasone Propionate (FLONASE NA) Place into the nose.    . GuaiFENesin (MUCINEX PO) Take by mouth.    Marland Kitchen HYDROcodone-homatropine (HYCODAN) 5-1.5 MG/5ML syrup Take 5 mLs by mouth every 6 (six) hours as needed for cough. 120 mL 0  . ibuprofen (ADVIL,MOTRIN) 200 MG tablet Take 400 mg by mouth every 6 (six) hours as needed for moderate pain.    . medroxyPROGESTERone (PROVERA) 2.5 MG tablet Take 2.5 mg by mouth daily.    . metoCLOPramide (REGLAN) 10 MG tablet Take 1 tablet (10 mg total) by mouth every 6 (six) hours as needed for nausea (nausea/headache). 6 tablet 0  . Multiple Vitamin (MULTIVITAMIN) tablet Take 1 tablet by mouth daily.     Current Facility-Administered Medications on File Prior to Visit  Medication Dose Route Frequency Provider Last Rate Last Dose  . 0.9 %  sodium chloride infusion  500 mL Intravenous Continuous Milus Banister, MD        BP 104/73 (BP Location: Right Arm, Patient Position: Sitting, Cuff Size: Normal)   Pulse 66   Temp 98 F (36.7 C) (Oral)   Resp 16   Ht 5' 6"  (1.676 m)   Wt 180 lb 6 oz (81.8 kg)   LMP 10/15/2011   SpO2 100%   BMI 29.11 kg/m       Objective:   Physical Exam  General  Mental Status - Alert. General Appearance - Well groomed. Not in acute distress. Overall looks better.   Skin Rashes- No Rashes.  HEENT Head-  Normal. Ear Auditory Canal - Left- Normal. Right - Normal.Tympanic Membrane- Left- Normal. Right- Normal. Eye Sclera/Conjunctiva- Left- Normal. Right- Normal. Nose & Sinuses Nasal Mucosa- Left-  Not Boggy and Congested. Right-   Not Boggy and  Congested.Bilateral no  maxillary and  No  frontal sinus pressure. Mouth & Throat Lips: Upper Lip- Normal: no dryness, cracking, pallor, cyanosis, or vesicular eruption. Lower Lip-Normal: no dryness, cracking, pallor, cyanosis or vesicular eruption. Buccal Mucosa- Bilateral- No Aphthous ulcers. Oropharynx- No Discharge or Erythema. Tonsils: Characteristics- Bilateral- No Erythema or Congestion. Size/Enlargement- Bilateral- No enlargement. Discharge- bilateral-None.  Neck Neck- Supple. No Masses.   Chest and Lung Exam Auscultation: Breath Sounds:-Clear even and unlabored.  Cardiovascular Auscultation:Rythm- Regular, rate and rhythm. Murmurs & Other Heart Sounds:Ausculatation of the heart reveal- No Murmurs.  Lymphatic Head & Neck General Head & Neck Lymphatics: Bilateral: Description- No Localized lymphadenopathy.  Abdomen- soft, nontender, nondistended.       Assessment & Plan:  You appear to have flu illness(positive type A on last viisit)and appear to be recovering. Some challenge since unable to take the antivirals or antibiotic(concern for sinus infection on last visit)  Also you can't immodium either or meds such as zofran.  I don't think you need inhaler presently by exam and you can't tolerate albuterol pocket inhaler so won't rx.  If your symptoms worsen or change again as before then ED evaluation. But go to main ED as admission could be done if you were to develop walking pneumonia. With your severe allergy history/side effect history would be very hesitant to start oral antibiotic as you had severe reaction to azithromycin recently.  For cough will rx benzonatate since ED gave but you did not fill.   Follow up in 3-4 days or  as needed  Asked pt to pick up kit today. To do stool tests.  I discussed with patient that if worsens she will need to go to hospital as she would need close monitoring and likely iv meds as she can't tolerate various meds orally. Repeating cxr today to see if any pneumonia developed since ED evaluation. Asked  MA to ask pt to return to cbc and cmp since our lab is closed.  Aroush Chasse, Percell Miller, PA-C

## 2016-02-25 NOTE — Patient Instructions (Addendum)
You appear to have flu illness(positive  Type A on last visit) and appear to be  recovering. Some challenge since unable to take the antivirals or antibiotic.(concern for sinus infection on last visit)  Also you can't immodium either or meds such as zofran.  I don't think you need inhaler presently by exam and you can't tolerate albuterol pocket inhaler so won't rx.  If your symptoms worsen or change again as before then ED evaluation. But go to main ED as admission could be done if you were to develop walking pneumonia. With your severe allergy history/side effect history would be very hesitant to start oral antibiotic as you had severe reaction to azithromycin recently.  For cough will rx benzonatate since ED gave but you did not fill.   Follow up in 3-4 days or as needed

## 2016-02-25 NOTE — Telephone Encounter (Signed)
FYI: Patient went to ED on 02/23/16

## 2016-02-25 NOTE — Progress Notes (Signed)
Pre visit review using our clinic review tool, if applicable. No additional management support is needed unless otherwise documented below in the visit note/SLS  

## 2016-02-25 NOTE — Telephone Encounter (Signed)
I am aware she went to ED. Advised her to do go since she had excess diarrhea with zpack and stated she simply could not take(dx flu and maybe sinus infection when I saw her). Advised on firiday hydrate with propel and if not successful go to ED. ED was under the impression she was on  azithromycin but that is not the case. Does she feel some better now? Also she can't take tamiflu due to allergic reaction.

## 2016-02-25 NOTE — Telephone Encounter (Signed)
Patient has appointment with Ramon DredgeEdward today scheduled at 3:30pm/SLS 01/29

## 2016-02-26 ENCOUNTER — Telehealth: Payer: Self-pay | Admitting: *Deleted

## 2016-02-26 ENCOUNTER — Telehealth: Payer: Self-pay | Admitting: Family Medicine

## 2016-02-26 DIAGNOSIS — R197 Diarrhea, unspecified: Secondary | ICD-10-CM

## 2016-02-26 NOTE — Telephone Encounter (Signed)
Pt called in. She says that she had a chest x-ray complete and would like to know the results. She said that she would like a call back as soon as possible.     CB: [phone]

## 2016-02-26 NOTE — Telephone Encounter (Signed)
Copy & Pasted in to Result Note.

## 2016-02-26 NOTE — Telephone Encounter (Signed)
Called pt today and she states feels better with more energy. Not having fevers but still has loose stools. Advised to hydrate well. Offered to get labs tomorrow and turn in stool panel. She wants to wait but I advised please don't wait past Friday am. She agreed. Also still some cough but no wheeze reported. Advised to try to increase benzonatate to 200 mg every 8 hours as needed. Can try delsym but not together wait at least 10--12 hours after benzonatate. Pt had hx of reflux one time in past after got sick. She had to use nexium. If get reflux could use otc ppi or zantac. Discussed with pt cxr and  antibiotic still not indicated at this time. Apologized for delay in call back cxr. I reviewed cxr at 10 pm. Send to staff who was busy with me rooming pt from 8 am-1pm next morning Then I got message around 6 pm. Message was sent to me at around 5 pm on my half day. I saw call back request since I check computer often on 1/2 day. Pt expressed understanding and accepted my apology.

## 2016-02-26 NOTE — Telephone Encounter (Addendum)
Copy & Pasted to Result Note and forwarded to provider, since this was to be a STAT result yesterday and I am not quite sure what result note is telling patient to do now/SLS 01/30

## 2016-02-26 NOTE — Telephone Encounter (Signed)
Patient called stating that she had a chest x-ray done that was supposed to be resulted stat. She states she has called multiple times today and still has not gotten a call back with the results. She is very frustrated and says that this is unacceptable. She states that she does have Edward's cell number from when she last saw him. She states she does not want to call him after hours but will if she does not get a call on her results. She would like to know if she has pneumonia or not. Please advise

## 2016-02-27 ENCOUNTER — Ambulatory Visit (INDEPENDENT_AMBULATORY_CARE_PROVIDER_SITE_OTHER): Payer: 59 | Admitting: Family Medicine

## 2016-02-27 ENCOUNTER — Encounter: Payer: Self-pay | Admitting: Family Medicine

## 2016-02-27 VITALS — BP 120/70 | HR 61 | Temp 97.9°F | Ht 66.5 in | Wt 179.1 lb

## 2016-02-27 DIAGNOSIS — K219 Gastro-esophageal reflux disease without esophagitis: Secondary | ICD-10-CM | POA: Diagnosis not present

## 2016-02-27 DIAGNOSIS — J101 Influenza due to other identified influenza virus with other respiratory manifestations: Secondary | ICD-10-CM | POA: Diagnosis not present

## 2016-02-27 DIAGNOSIS — R21 Rash and other nonspecific skin eruption: Secondary | ICD-10-CM

## 2016-02-27 MED ORDER — TRIAMCINOLONE ACETONIDE 0.1 % EX CREA: 1.0000 "application " | TOPICAL_CREAM | Freq: Two times a day (BID) | CUTANEOUS | 1 refills | Status: DC

## 2016-02-27 MED ORDER — ESOMEPRAZOLE MAGNESIUM 40 MG PO PACK: 40.0000 mg | PACK | Freq: Every day | ORAL | 3 refills | Status: DC

## 2016-02-27 MED FILL — ESOMEPRAZOLE MAG DR 40 MG C: 40 | 30 days supply | Qty: 30 | Fill #0

## 2016-02-27 MED FILL — TRIAMCINOLONE 0.1% CREAM: 0.1 | 30 days supply | Qty: 80 | Fill #0 | Status: TO

## 2016-02-27 NOTE — Progress Notes (Signed)
[facility] [address], [address] [place], [place] [address] [phone] [phone]  Date:  02/27/2016   Name:  Tracy Larsen   DOB:  04/15/1960   MRN:  [id number]  PCP:  Abbe AmsterdamOPLAND,JESSICA, MD    Chief Complaint: Shortness of Breath and Cough   History of Present Illness:  Tracy Mingseresa H Albarran is a 56 y.o. very pleasant female patient who presents with the following:  Here today with illness.   History of TIA, IBS, MVP On 1/25 she was here with illness- she was positive for flu A.  Given tamiflu and also azithromycin for a possible consurrent sinus infection. However she did not take the tamiflu.  She did take 2 pills of azithromycin. She then ended up in the ER on 1/27 with stomach upset/ diarrhea/ inability to tolerate PO- had a CXR that showed left lingula atelectasis.   She came in on Monday again and had a chest x-ray which showed persistent atelectasis at left base  She had a colonoscopy a couple of weeks ago and notes that her bowels were still getting back to normal when she got the flu Started on a probioitc just a couple of days ago for persistent diarrhea Her stools are getting better- today she had a loose stool but it was firming up She tried a zantac lat night and this am for shortness of breath and burning in her chest- thought that maybe she was having reflux.  She had similar sx in the past and was told that it was reflux. The zantac did help but she wonders if she should be on nexium instead as she was in the past   She has not noted any fever for the last 2 days.   Does admit that she seems to be getting better at this point.    She also has noted a rash on her trunk for the last few months- it will come and go and it quite itchy.  She has tried several differnet OTC meds and other products on it but it has not cleared up   Dg Chest 2 View  Result Date: 02/25/2016 CLINICAL DATA:  Fever, cough and congestion for past  week, pain across chest, recent flu, history asthma EXAM: CHEST  2 VIEW COMPARISON:  02/23/2016 FINDINGS: Normal heart size, mediastinal contours, and pulmonary vascularity. Minimal chronic central peribronchial thickening. LEFT basilar atelectasis persists. Lungs otherwise clear. No pulmonary infiltrate, pleural effusion, or pneumothorax. Bones demineralized. IMPRESSION: Chronic bronchitic changes with subsegmental atelectasis at LEFT base unchanged. No acute infiltrate. Electronically Signed   By: Ulyses SouthwardMark  Boles M.D.   On: 02/25/2016 17:50   Dg Chest 2 View  Result Date: 02/23/2016 CLINICAL DATA:  Cough, fever for 1 week EXAM: CHEST  2 VIEW COMPARISON:  02/12/2015 FINDINGS: Lingular subsegmental atelectasis. Right lung is clear. Heart is normal size. No effusions or acute bony abnormality. IMPRESSION: Lingular/left base atelectasis. Electronically Signed   By: Charlett NoseKevin  Dover M.D.   On: 02/23/2016 11:01    Patient Active Problem List   Diagnosis Date Noted  . Transient visual disturbance, bilateral 07/02/2015  . IBS (irritable bowel syndrome) 04/22/2013  . Mitral valve prolapse 09/24/2012  . TIA (transient ischemic attack) 09/24/2012    Past Medical History:  Diagnosis Date  . Allergy   . Arthritis    Patient denies.   . Asthma   . Endometriosis   . GERD (gastroesophageal reflux disease)   . Herpes   .  HSV (herpes simplex virus) infection   . IBS (irritable bowel syndrome)   . Insomnia   . Mitral valve prolapse   . UTI (urinary tract infection)    chronic history r/t bladder reflux which was repaired,    Past Surgical History:  Procedure Laterality Date  . BLADDER REPAIR     AGE 17  . NASAL SINUS SURGERY    . PELVIC LAPAROSCOPY     X 5  . URETHRAL STRICTURE DILATATION      Social History  Substance Use Topics  . Smoking status: Former Smoker    Quit date: 01/27/2001  . Smokeless tobacco: Never Used  . Alcohol use 0.6 oz/week    1 Glasses of wine per week    Family History   Problem Relation Age of Onset  . Cancer Mother     VULVAR CANCER  . Heart disease Father     MI in his 64s  . Cancer Brother     SARCOMA  . Heart disease Brother     WPW  . Diabetes Paternal Grandmother   . Colon cancer Neg Hx     Allergies  Allergen Reactions  . Augmentin [Amoxicillin-Pot Clavulanate] Shortness Of Breath  . Avelox [Moxifloxacin Hcl In Nacl] Shortness Of Breath and Palpitations  . Levofloxacin Shortness Of Breath and Palpitations  . Azithromycin Other (See Comments)    GI Issues/IBS Exacerbation  . Entex Other (See Comments)    Tongue peeling   . Erythromycin Diarrhea  . Promethazine Other (See Comments)    Made butt feel like it was on fire -- Suppository    . Stadol [Butorphanol] Other (See Comments)    dizziness  . Tamiflu [Oseltamivir Phosphate] Nausea And Vomiting  . Zofran [Ondansetron Hcl] Nausea Only and Other (See Comments)    Makes nausea much worse and dizziness   . Sulfa Antibiotics Rash  . Tetracyclines & Related Rash    Medication list has been reviewed and updated.  Current Outpatient Prescriptions on File Prior to Visit  Medication Sig Dispense Refill  . acetaminophen (TYLENOL) 325 MG tablet Take 650 mg by mouth every 6 (six) hours as needed.    Marland Kitchen acyclovir (ZOVIRAX) 400 MG tablet Take 400 mg by mouth 2 (two) times daily. Uses as needed    . benzonatate (TESSALON) 100 MG capsule Take 1 capsule (100 mg total) by mouth every 8 (eight) hours. 21 capsule 0  . benzonatate (TESSALON) 100 MG capsule Take 1 capsule (100 mg total) by mouth 3 (three) times daily as needed. 21 capsule 0  . DM-APAP-CPM (CORICIDIN HBP FLU PO) Take by mouth.    . estradiol (ESTRACE) 1 MG tablet Take 1 mg by mouth daily.    . fluticasone (FLONASE) 50 MCG/ACT nasal spray Place 2 sprays into both nostrils daily. 16 g 1  . Fluticasone Propionate (FLONASE NA) Place into the nose.    . GuaiFENesin (MUCINEX PO) Take by mouth.    Marland Kitchen HYDROcodone-homatropine (HYCODAN) 5-1.5  MG/5ML syrup Take 5 mLs by mouth every 6 (six) hours as needed for cough. 120 mL 0  . ibuprofen (ADVIL,MOTRIN) 200 MG tablet Take 400 mg by mouth every 6 (six) hours as needed for moderate pain.    . medroxyPROGESTERone (PROVERA) 2.5 MG tablet Take 2.5 mg by mouth daily.    . metoCLOPramide (REGLAN) 10 MG tablet Take 1 tablet (10 mg total) by mouth every 6 (six) hours as needed for nausea (nausea/headache). 6 tablet 0  . Multiple Vitamin (MULTIVITAMIN)  tablet Take 1 tablet by mouth daily.     Current Facility-Administered Medications on File Prior to Visit  Medication Dose Route Frequency Provider Last Rate Last Dose  . 0.9 %  sodium chloride infusion  500 mL Intravenous Continuous Rachael Fee, MD        Review of Systems:  As per HPI- otherwise negative.   Physical Examination: Vitals:   02/27/16 1438  BP: 120/70  Pulse: 61  Temp: 97.9 F (36.6 C)   Vitals:   02/27/16 1438  Weight: 179 lb 2 oz (81.3 kg)  Height: 5' 6.5" (1.689 m)   Body mass index is 28.48 kg/m. Ideal Body Weight: Weight in (lb) to have BMI = 25: 156.9  GEN: WDWN, NAD, Non-toxic, A & O x 3, overweight, looks well HEENT: Atraumatic, Normocephalic. Neck supple. No masses, No LAD.  Bilateral TM wnl, oropharynx normal.  PEERL,EOMI.   Ears and Nose: No external deformity. CV: RRR, No M/G/R. No JVD. No thrill. No extra heart sounds. PULM: CTA B, no wheezes, crackles, rhonchi. No retractions. No resp. distress. No accessory muscle use. ABD: S, NT, ND, +BS. Benign belly She has a fine, palpable rash on her back and belly.  Does not appear consistent with any dangerous etiology EXTR: No c/c/e NEURO Normal gait.  PSYCH: Normally interactive. Conversant. Not depressed or anxious appearing.  Calm demeanor.    Assessment and Plan: Influenza A - Plan: CBC  Rash - Plan: triamcinolone cream (KENALOG) 0.1 %  Gastroesophageal reflux disease, esophagitis presence not specified - Plan: esomeprazole (NEXIUM) 40 MG  packet  Dx with flu about a week ago.  Explained that flu sx often last a week or longer, so it is typical for her to still not be feeling well.  However she is now improving and her fever is resolved. At this time would not suggest another antibiotic unless we have a very good reason as she has had diarrhea.  Will check a CBC for her per her request  I am sorry that you got the flu!   It does sound like you are starting to turn the corner, but please keep me posted Try the triamcinolone cream for your rash. If this does not clear it up we can do a biopsy when you are feeling better.  Let me know if getting worse I sent in an rx for nexium to take once a day for reflux.  Continue to use OTC medications as needed for your other symptoms and rest!   I will get your blood count back tomorrow and will alert you if any increase in your white blood cell count    Signed Abbe Amsterdam, MD

## 2016-02-27 NOTE — Patient Instructions (Addendum)
I am sorry that you got the flu!   It does sound like you are starting to turn the corner, but please keep me posted Try the triamcinolone cream for your rash. If this does not clear it up we can do a biopsy when you are feeling better.  Let me know if getting worse I sent in an rx for nexium to take once a day for reflux.  Continue to use OTC medications as needed for your other symptoms and rest!   I will get your blood count back tomorrow and will alert you if any increase in your white blood cell count

## 2016-02-27 NOTE — Progress Notes (Signed)
Pre visit review using our clinic review tool, if applicable. No additional management support is needed unless otherwise documented below in the visit note. 

## 2016-02-28 LAB — CBC
HCT: 41.1 % (ref 36.0–46.0)
Hemoglobin: 13.8 g/dL (ref 12.0–15.0)
MCHC: 33.6 g/dL (ref 30.0–36.0)
MCV: 87.7 fl (ref 78.0–100.0)
PLATELETS: 274 10*3/uL (ref 150.0–400.0)
RBC: 4.69 Mil/uL (ref 3.87–5.11)
RDW: 12.7 % (ref 11.5–15.5)
WBC: 5.3 10*3/uL (ref 4.0–10.5)

## 2016-02-29 ENCOUNTER — Encounter: Payer: Self-pay | Admitting: Family Medicine

## 2016-04-23 MED FILL — TRIAMCINOLONE 0.1% CREAM: 0.1 | 20 days supply | Qty: 80 | Fill #0

## 2016-06-11 DIAGNOSIS — R310 Gross hematuria: Secondary | ICD-10-CM | POA: Diagnosis not present

## 2016-06-11 DIAGNOSIS — R8271 Bacteriuria: Secondary | ICD-10-CM | POA: Diagnosis not present

## 2016-06-11 DIAGNOSIS — R300 Dysuria: Secondary | ICD-10-CM | POA: Diagnosis not present

## 2016-08-22 DIAGNOSIS — A419 Sepsis, unspecified organism: Secondary | ICD-10-CM | POA: Diagnosis not present

## 2016-08-22 DIAGNOSIS — Z881 Allergy status to other antibiotic agents status: Secondary | ICD-10-CM | POA: Diagnosis not present

## 2016-08-22 DIAGNOSIS — N12 Tubulo-interstitial nephritis, not specified as acute or chronic: Secondary | ICD-10-CM | POA: Diagnosis not present

## 2016-08-22 DIAGNOSIS — R509 Fever, unspecified: Secondary | ICD-10-CM | POA: Diagnosis not present

## 2016-08-22 DIAGNOSIS — R0600 Dyspnea, unspecified: Secondary | ICD-10-CM | POA: Diagnosis not present

## 2016-08-23 DIAGNOSIS — N10 Acute tubulo-interstitial nephritis: Secondary | ICD-10-CM | POA: Diagnosis not present

## 2016-08-23 DIAGNOSIS — I10 Essential (primary) hypertension: Secondary | ICD-10-CM | POA: Diagnosis not present

## 2016-08-23 DIAGNOSIS — A419 Sepsis, unspecified organism: Secondary | ICD-10-CM | POA: Diagnosis not present

## 2016-08-23 DIAGNOSIS — R079 Chest pain, unspecified: Secondary | ICD-10-CM | POA: Diagnosis not present

## 2016-08-23 DIAGNOSIS — R0789 Other chest pain: Secondary | ICD-10-CM | POA: Diagnosis not present

## 2016-08-23 DIAGNOSIS — A4151 Sepsis due to Escherichia coli [E. coli]: Secondary | ICD-10-CM | POA: Diagnosis not present

## 2016-08-23 DIAGNOSIS — Z87891 Personal history of nicotine dependence: Secondary | ICD-10-CM | POA: Diagnosis not present

## 2016-08-23 DIAGNOSIS — N12 Tubulo-interstitial nephritis, not specified as acute or chronic: Secondary | ICD-10-CM | POA: Diagnosis not present

## 2016-08-23 DIAGNOSIS — R109 Unspecified abdominal pain: Secondary | ICD-10-CM | POA: Diagnosis not present

## 2016-08-23 DIAGNOSIS — R509 Fever, unspecified: Secondary | ICD-10-CM | POA: Diagnosis not present

## 2016-08-23 DIAGNOSIS — R300 Dysuria: Secondary | ICD-10-CM | POA: Diagnosis not present

## 2016-08-23 DIAGNOSIS — R0601 Orthopnea: Secondary | ICD-10-CM | POA: Diagnosis not present

## 2016-08-23 DIAGNOSIS — Z79899 Other long term (current) drug therapy: Secondary | ICD-10-CM | POA: Diagnosis not present

## 2016-08-23 DIAGNOSIS — Z881 Allergy status to other antibiotic agents status: Secondary | ICD-10-CM | POA: Diagnosis not present

## 2016-08-23 DIAGNOSIS — Z87440 Personal history of urinary (tract) infections: Secondary | ICD-10-CM | POA: Diagnosis not present

## 2016-08-23 DIAGNOSIS — R0600 Dyspnea, unspecified: Secondary | ICD-10-CM | POA: Diagnosis not present

## 2016-08-23 DIAGNOSIS — Z792 Long term (current) use of antibiotics: Secondary | ICD-10-CM | POA: Diagnosis not present

## 2016-08-25 DIAGNOSIS — R079 Chest pain, unspecified: Secondary | ICD-10-CM

## 2016-08-26 ENCOUNTER — Telehealth: Payer: Self-pay | Admitting: Family Medicine

## 2016-08-26 ENCOUNTER — Other Ambulatory Visit: Payer: Self-pay | Admitting: Emergency Medicine

## 2016-08-26 ENCOUNTER — Other Ambulatory Visit (INDEPENDENT_AMBULATORY_CARE_PROVIDER_SITE_OTHER): Payer: 59

## 2016-08-26 DIAGNOSIS — R509 Fever, unspecified: Secondary | ICD-10-CM | POA: Diagnosis not present

## 2016-08-26 LAB — POC URINALSYSI DIPSTICK (AUTOMATED)
Bilirubin, UA: NEGATIVE
Glucose, UA: NEGATIVE
Ketones, UA: NEGATIVE
Leukocytes, UA: NEGATIVE
Nitrite, UA: NEGATIVE
Protein, UA: NEGATIVE
Spec Grav, UA: 1.01 (ref 1.010–1.025)
UROBILINOGEN UA: 0.2 U/dL
pH, UA: 6 (ref 5.0–8.0)

## 2016-08-26 NOTE — Telephone Encounter (Signed)
Notified pt and scheduled lab appt for today at 2:30pm. Future lab orders entered. Records received and forwarded to Dr Abner GreenspanBlyth for review and then forward to Dr Patsy Lageropland for hospital f/u on 09/03/16.

## 2016-08-26 NOTE — Telephone Encounter (Signed)
Caller name: Tracy Larsen Relationship to patient: self Can be reached: [phone]  Reason for call: Pt was in hospital with sepsis and kidney inf. She was trying to get records forwarded to Dr. Patsy Lageropland. Marcelino DusterMichelle told pt she can fax records to Dr. Patsy Lageropland if we fax a letter on our letterhead with fax # and ph# to our office. She told pt she did not need to come in to sign MR release form. Please send asap. Pt is feeling badly and wants Dr. Patsy Lageropland to review records for her.  [name]

## 2016-08-26 NOTE — Telephone Encounter (Signed)
Dr Abner GreenspanBlyth-- please see U/A from this afternoon. It has also been sent for culture.

## 2016-08-26 NOTE — Progress Notes (Unsigned)
poct

## 2016-08-26 NOTE — Telephone Encounter (Signed)
Pt called back in to clarify. She said that she has only had 2 doses of medication (antibiotic)  she just started it last night after hospital discharge. She will be taking another one tonight. Pt said that she isn't sure if she's had enough medication in her system for lab.

## 2016-08-26 NOTE — Telephone Encounter (Signed)
Spoke with pt. States [facility] past Friday night with UTI and sepsis. Pt also had intermittent, non-productive cough 2 days prior to hospitalization and while in hospital. Had intermittent left side chest discomfort in hospital but it is now resolved.  Reports xray and CT done showed that her lungs were clear.  Pt was given 2 different IV abx while inpatient. Blood cultures were done but states unclear at time of discharge on Monday and doctor said they would give her rocephin to see if that would treat her infection and change to Vantin if she tolerated rocephin ok. Pt now on Vantin. Pt states she feels some better but notes urine still has a bad odor and she continues to run nightly fever of 100.7. Reports that urine looks clear. She plans to return to work tomorrow and has hospital f/u scheduled with Dr Patsy Lageropland on 09/03/16.  Pt wants to know if it is normal to still be running a fever and how long can she expect to run fever? I [facility] to obtain these records.  Please advise?

## 2016-08-26 NOTE — Telephone Encounter (Signed)
Not clear if fever is from urine or cough. Would recommend we repeat a UA anc c and s to make sure it is responding if she is willing. I will review once done.

## 2016-08-27 ENCOUNTER — Encounter: Payer: Self-pay | Admitting: Family Medicine

## 2016-08-27 LAB — URINE CULTURE: ORGANISM ID, BACTERIA: NO GROWTH

## 2016-08-27 NOTE — Telephone Encounter (Signed)
Dr Patsy Lageropland-- please see below message from pt as well as u/a and culture that was done yesterday. Just didn't want anything to get missed. Thanks.

## 2016-08-28 ENCOUNTER — Telehealth: Payer: Self-pay | Admitting: Family Medicine

## 2016-08-28 NOTE — Telephone Encounter (Signed)
Received records from [facility] in [place] regarding recent admission- will abstract and scan as needed  She was admitted on 7/28 with presumed urosepsis; she had started some cipro at home prior to presentation, had fevers up to 103 On Ct abd she was also noted to have linear lung base opacities bilaterally-chest x-ray showed no acute lung findings on 7/27  Discharged on 7/30- request repeat CBC and BMP at follow-up visit  Blood cultures were negative at time of discharge, but urine culture showed just mixed bacteria/ contamination

## 2016-08-29 ENCOUNTER — Encounter: Payer: Self-pay | Admitting: Family Medicine

## 2016-08-29 DIAGNOSIS — N10 Acute tubulo-interstitial nephritis: Secondary | ICD-10-CM | POA: Diagnosis not present

## 2016-08-29 DIAGNOSIS — N3000 Acute cystitis without hematuria: Secondary | ICD-10-CM | POA: Diagnosis not present

## 2016-09-03 ENCOUNTER — Encounter: Payer: Self-pay | Admitting: Family Medicine

## 2016-09-03 ENCOUNTER — Ambulatory Visit (INDEPENDENT_AMBULATORY_CARE_PROVIDER_SITE_OTHER): Payer: 59 | Admitting: Family Medicine

## 2016-09-03 VITALS — BP 124/84 | HR 57 | Temp 97.6°F | Ht 66.5 in | Wt 187.0 lb

## 2016-09-03 DIAGNOSIS — N12 Tubulo-interstitial nephritis, not specified as acute or chronic: Secondary | ICD-10-CM

## 2016-09-03 DIAGNOSIS — Z09 Encounter for follow-up examination after completed treatment for conditions other than malignant neoplasm: Secondary | ICD-10-CM | POA: Diagnosis not present

## 2016-09-03 LAB — BASIC METABOLIC PANEL
BUN: 14 mg/dL (ref 6–23)
CALCIUM: 9.1 mg/dL (ref 8.4–10.5)
CO2: 30 mEq/L (ref 19–32)
CREATININE: 0.82 mg/dL (ref 0.40–1.20)
Chloride: 104 mEq/L (ref 96–112)
GFR: 77 mL/min (ref >60.00)
Glucose, Bld: 93 mg/dL (ref 70–99)
POTASSIUM: 3.9 meq/L (ref 3.5–5.1)
Sodium: 138 mEq/L (ref 135–145)

## 2016-09-03 LAB — CBC
HEMATOCRIT: 38 % (ref 36.0–46.0)
Hemoglobin: 12.4 g/dL (ref 12.0–15.0)
MCHC: 32.8 g/dL (ref 30.0–36.0)
MCV: 88.7 fl (ref 78.0–100.0)
PLATELETS: 390 10*3/uL (ref 150.0–400.0)
RBC: 4.28 Mil/uL (ref 3.87–5.11)
RDW: 13.2 % (ref 11.5–15.5)
WBC: 6.5 10*3/uL (ref 4.0–10.5)

## 2016-09-03 NOTE — Progress Notes (Addendum)
[facility] at [facility] [address] [place], [place] [address] [phone]  Date:  09/03/2016   Name:  Tracy Larsen   DOB:  03-23-1960   MRN:  [id number]  PCP:  Pearline Cables, MD    Chief Complaint: Hospitalization Follow-up (Pt here for hosp f/u visit. ) and Insect Bite (c/o possible insect bite that happened last night. )   History of Present Illness:  Tracy Larsen is a 56 y.o. very pleasant female patient who presents with the following: About 2 weeks ago she got sick with UTI sx- she thought this was run of the mill UTI for her, but she got much sicker at home and her husband called EMS.  Before EMS arrived she took a dose of cipro and felt SOB_- however we are not sure if this might be due to her being so ill in the first place.  She is not happy because cipro has been her go- to drug in the past and she is not sure if she can take it in the future She was admitted from 7/28 to 7/30 at [facility] with urosepsis   We got a repeat UA and urine culture on 7/31- these looked ok  She also saw Crecencio Mc last week at [facility] urology- he repeated her urine, it was clear there as well.   She is eating and drinking normally now, her energy level is improving and she is back at work She did have fevers until a week ago yesterday  She is finishing up her course of Vantin right now- she is tolerating this ok  [facility] requested a CBC and BMP at follow-up- will get this for her today  She reports history of known microhematuria- this has been evaluated by urology in the past and found to be benign per her report  Patient Active Problem List   Diagnosis Date Noted  . Transient visual disturbance, bilateral 07/02/2015  . IBS (irritable bowel syndrome) 04/22/2013  . Mitral valve prolapse 09/24/2012  . TIA (transient ischemic attack) 09/24/2012    Past Medical History:  Diagnosis Date  . Allergy   . Arthritis    Patient denies.   . Asthma   . Endometriosis   . GERD (gastroesophageal reflux disease)   . Herpes   . HSV (herpes simplex virus) infection   . IBS (irritable bowel syndrome)   . Insomnia   . Mitral valve prolapse   . UTI (urinary tract infection)    chronic history r/t bladder reflux which was repaired,    Past Surgical History:  Procedure Laterality Date  . BLADDER REPAIR     AGE 27  . NASAL SINUS SURGERY    . PELVIC LAPAROSCOPY     X 5  . URETHRAL STRICTURE DILATATION      Social History  Substance Use Topics  . Smoking status: Former Smoker    Quit date: 01/27/2001  . Smokeless tobacco: Never Used  . Alcohol use 0.6 oz/week    1 Glasses of wine per week    Family History  Problem Relation Age of Onset  . Cancer Mother        VULVAR CANCER  . Heart disease Father        MI in his 22s  . Cancer Brother        SARCOMA  . Heart disease Brother        WPW  . Diabetes Paternal Grandmother   .  Colon cancer Neg Hx     Allergies  Allergen Reactions  . Augmentin [Amoxicillin-Pot Clavulanate] Shortness Of Breath  . Avelox [Moxifloxacin Hcl In Nacl] Shortness Of Breath and Palpitations  . Ciprofloxacin Shortness Of Breath and Nausea And Vomiting  . Levofloxacin Shortness Of Breath and Palpitations  . Azithromycin Other (See Comments)    GI Issues/IBS Exacerbation  . Entex Other (See Comments)    Tongue peeling   . Erythromycin Diarrhea  . Promethazine Other (See Comments)    Made butt feel like it was on fire -- Suppository    . Stadol [Butorphanol] Other (See Comments)    dizziness  . Tamiflu [Oseltamivir Phosphate] Nausea And Vomiting  . Zofran [Ondansetron Hcl] Nausea Only and Other (See Comments)    Makes nausea much worse and dizziness   . Sulfa Antibiotics Rash  . Tetracyclines & Related Rash    Medication list has been reviewed and updated.  Current Outpatient Prescriptions on File Prior to Visit  Medication Sig Dispense Refill  . acetaminophen  (TYLENOL) 325 MG tablet Take 650 mg by mouth every 6 (six) hours as needed.    Marland Kitchen acyclovir (ZOVIRAX) 400 MG tablet Take 400 mg by mouth 2 (two) times daily. Uses as needed    . estradiol (ESTRACE) 1 MG tablet Take 1 mg by mouth daily.    . fluticasone (FLONASE) 50 MCG/ACT nasal spray Place 2 sprays into both nostrils daily. 16 g 1  . Fluticasone Propionate (FLONASE NA) Place into the nose.    . GuaiFENesin (MUCINEX PO) Take by mouth.    Marland Kitchen HYDROcodone-homatropine (HYCODAN) 5-1.5 MG/5ML syrup Take 5 mLs by mouth every 6 (six) hours as needed for cough. 120 mL 0  . ibuprofen (ADVIL,MOTRIN) 200 MG tablet Take 400 mg by mouth every 6 (six) hours as needed for moderate pain.    . medroxyPROGESTERone (PROVERA) 2.5 MG tablet Take 2.5 mg by mouth daily.    . metoCLOPramide (REGLAN) 10 MG tablet Take 1 tablet (10 mg total) by mouth every 6 (six) hours as needed for nausea (nausea/headache). 6 tablet 0  . Multiple Vitamin (MULTIVITAMIN) tablet Take 1 tablet by mouth daily.    Marland Kitchen triamcinolone cream (KENALOG) 0.1 % Apply 1 application topically 2 (two) times daily. Use as needed for rash 80 g 1   Current Facility-Administered Medications on File Prior to Visit  Medication Dose Route Frequency Provider Last Rate Last Dose  . 0.9 %  sodium chloride infusion  500 mL Intravenous Continuous Rachael Fee, MD        Review of Systems:  As per HPI- otherwise negative.   Physical Examination: Vitals:   09/03/16 1152  BP: 124/84  Pulse: (!) 57  Temp: 97.6 F (36.4 C)   Vitals:   09/03/16 1152  Weight: 187 lb (84.8 kg)  Height: 5' 6.5" (1.689 m)   Body mass index is 29.73 kg/m. Ideal Body Weight: Weight in (lb) to have BMI = 25: 156.9  GEN: WDWN, NAD, Non-toxic, A & O x 3, overweight, looks well HEENT: Atraumatic, Normocephalic. Neck supple. No masses, No LAD.  Bilateral TM wnl, oropharynx normal.  PEERL,EOMI.   Ears and Nose: No external deformity. CV: RRR, No M/G/R. No JVD. No thrill. No  extra heart sounds. PULM: CTA B, no wheezes, crackles, rhonchi. No retractions. No resp. distress. No accessory muscle use. ABD: S, NT, ND, +BS. No rebound. No HSM.  Benign belly, no CVA tendernes  EXTR: No c/c/e NEURO Normal gait.  PSYCH: Normally interactive. Conversant. Not depressed or anxious appearing.  Calm demeanor.    Assessment and Plan: Hospital discharge follow-up - Plan: CBC, Basic metabolic panel  Pyelonephritis  Recent hospital stay for sepsis/ pyelonephritis She is feeling much better now - repeat labs as above Finish out abx Repeat urine culture was negative last week Went over her hospital History and DC summary with her today Will plan further follow- up pending labs. She will seek care if any change or worsening of her sx    Signed Abbe AmsterdamJessica Zamyia Gowell, MD  Received her labs  Results for orders placed or performed in visit on 09/03/16  CBC  Result Value Ref Range   WBC 6.5 4.0 - 10.5 K/uL   RBC 4.28 3.87 - 5.11 Mil/uL   Platelets 390.0 150.0 - 400.0 K/uL   Hemoglobin 12.4 12.0 - 15.0 g/dL   HCT 16.138.0 09.636.0 - 04.546.0 %   MCV 88.7 78.0 - 100.0 fl   MCHC 32.8 30.0 - 36.0 g/dL   RDW 40.913.2 81.111.5 - 91.415.5 %  Basic metabolic panel  Result Value Ref Range   Sodium 138 135 - 145 mEq/L   Potassium 3.9 3.5 - 5.1 mEq/L   Chloride 104 96 - 112 mEq/L   CO2 30 19 - 32 mEq/L   Glucose, Bld 93 70 - 99 mg/dL   BUN 14 6 - 23 mg/dL   Creatinine, Ser 7.820.82 0.40 - 1.20 mg/dL   Calcium 9.1 8.4 - 95.610.5 mg/dL   GFR 21.3077.00 >86.57>60.00 mL/min   Message to pt

## 2016-09-03 NOTE — Patient Instructions (Addendum)
It was good to see you today- I am glad that you are feeling better! We will be in touch with your labs asap

## 2016-11-10 DIAGNOSIS — Z23 Encounter for immunization: Secondary | ICD-10-CM | POA: Diagnosis not present

## 2016-12-12 DIAGNOSIS — Z1231 Encounter for screening mammogram for malignant neoplasm of breast: Secondary | ICD-10-CM | POA: Diagnosis not present

## 2017-02-20 DIAGNOSIS — Z01419 Encounter for gynecological examination (general) (routine) without abnormal findings: Secondary | ICD-10-CM | POA: Diagnosis not present

## 2017-02-20 DIAGNOSIS — Z124 Encounter for screening for malignant neoplasm of cervix: Secondary | ICD-10-CM | POA: Diagnosis not present

## 2017-02-20 DIAGNOSIS — N760 Acute vaginitis: Secondary | ICD-10-CM | POA: Diagnosis not present

## 2017-02-20 MED FILL — CLINDAMYCIN 2% VAGINAL CRM: 2 | 8 days supply | Qty: 40 | Fill #0

## 2017-02-20 MED FILL — ESTRADIOL 1 MG TABLET: 1 | 90 days supply | Qty: 90 | Fill #0

## 2017-02-20 MED FILL — MEDROXYPROGESTERONE 2.5 MG: 2.5 | 90 days supply | Qty: 90 | Fill #0

## 2017-02-23 DIAGNOSIS — Z0000 Encounter for general adult medical examination without abnormal findings: Secondary | ICD-10-CM | POA: Diagnosis not present

## 2017-03-27 ENCOUNTER — Emergency Department (HOSPITAL_COMMUNITY)
Admission: EM | Admit: 2017-03-27 | Discharge: 2017-03-27 | Disposition: A | Discharge status: Home / Self Care | Payer: 59 | Attending: Emergency Medicine | Admitting: Emergency Medicine

## 2017-03-27 ENCOUNTER — Other Ambulatory Visit: Payer: Self-pay

## 2017-03-27 ENCOUNTER — Encounter: Payer: Self-pay | Admitting: Nurse Practitioner

## 2017-03-27 ENCOUNTER — Encounter (HOSPITAL_COMMUNITY): Payer: Self-pay

## 2017-03-27 ENCOUNTER — Ambulatory Visit: Payer: Self-pay | Admitting: *Deleted

## 2017-03-27 ENCOUNTER — Ambulatory Visit (INDEPENDENT_AMBULATORY_CARE_PROVIDER_SITE_OTHER): Payer: Self-pay | Admitting: Nurse Practitioner

## 2017-03-27 ENCOUNTER — Emergency Department (HOSPITAL_COMMUNITY): Payer: 59

## 2017-03-27 VITALS — BP 104/70 | HR 74 | Temp 98.5°F | Wt 191.2 lb

## 2017-03-27 DIAGNOSIS — J45909 Unspecified asthma, uncomplicated: Secondary | ICD-10-CM | POA: Insufficient documentation

## 2017-03-27 DIAGNOSIS — R079 Chest pain, unspecified: Secondary | ICD-10-CM

## 2017-03-27 DIAGNOSIS — K219 Gastro-esophageal reflux disease without esophagitis: Secondary | ICD-10-CM | POA: Insufficient documentation

## 2017-03-27 DIAGNOSIS — Z79899 Other long term (current) drug therapy: Secondary | ICD-10-CM | POA: Diagnosis not present

## 2017-03-27 DIAGNOSIS — Z8673 Personal history of transient ischemic attack (TIA), and cerebral infarction without residual deficits: Secondary | ICD-10-CM | POA: Insufficient documentation

## 2017-03-27 DIAGNOSIS — R110 Nausea: Secondary | ICD-10-CM | POA: Diagnosis not present

## 2017-03-27 DIAGNOSIS — Z87891 Personal history of nicotine dependence: Secondary | ICD-10-CM | POA: Insufficient documentation

## 2017-03-27 DIAGNOSIS — R0602 Shortness of breath: Secondary | ICD-10-CM | POA: Diagnosis not present

## 2017-03-27 LAB — CBC
HCT: 41.5 % (ref 36.0–46.0)
Hemoglobin: 13.4 g/dL (ref 12.0–15.0)
MCH: 29.5 pg (ref 26.0–34.0)
MCHC: 32.3 g/dL (ref 30.0–36.0)
MCV: 91.4 fL (ref 78.0–100.0)
Platelets: 221 10*3/uL (ref 150–400)
RBC: 4.54 MIL/uL (ref 3.87–5.11)
RDW: 12.8 % (ref 11.5–15.5)
WBC: 4.3 10*3/uL (ref 4.0–10.5)

## 2017-03-27 LAB — BASIC METABOLIC PANEL
ANION GAP: 9 (ref 5–15)
BUN: 17 mg/dL (ref 6–20)
CALCIUM: 9.2 mg/dL (ref 8.9–10.3)
CO2: 24 mmol/L (ref 22–32)
Chloride: 107 mmol/L (ref 101–111)
Creatinine, Ser: 0.98 mg/dL (ref 0.44–1.00)
GFR calc Af Amer: >60 mL/min (ref >60)
GFR calc non Af Amer: >60 mL/min (ref >60)
Glucose, Bld: 102 mg/dL — ABNORMAL HIGH (ref 65–99)
Potassium: 4.7 mmol/L (ref 3.5–5.1)
Sodium: 140 mmol/L (ref 135–145)

## 2017-03-27 LAB — I-STAT BETA HCG BLOOD, ED (MC, WL, AP ONLY): I-stat hCG, quantitative: <5 m[IU]/mL (ref <5)

## 2017-03-27 LAB — I-STAT TROPONIN, ED
TROPONIN I, POC: 0 ng/mL (ref 0.00–0.08)
TROPONIN I, POC: 0 ng/mL (ref 0.00–0.08)

## 2017-03-27 MED ORDER — OMEPRAZOLE 40 MG PO CPDR: 40.0000 mg | DELAYED_RELEASE_CAPSULE | Freq: Every day | ORAL | 0 refills | Status: DC

## 2017-03-27 MED ORDER — GI COCKTAIL ~~LOC~~
30.0000 mL | Freq: Once | ORAL | Status: AC
  Administered 2017-03-27: 30 mL via ORAL
  Filled 2017-03-27: qty 30

## 2017-03-27 NOTE — Discharge Instructions (Signed)
Take 1 tablet of omeprazole daily for the next 14 days.  If you do continue to have increased belching and burping, you can also take Tums, Zantac, or try Maalox these medications well work immediately on her symptoms..  You can continue to take Gas-X as directed on the packaging to improve your symptoms in the moment.  If the omeprazole trial does not improve your symptoms, you can call and schedule a follow-up appointment with gastroenterology.  Try to avoid eating for 2-3 hours prior to going to bed.  Sometimes you can reduce your symptoms at nighttime by sleeping with more pillows so that you are abraded a 45 degree angle.  Please call to schedule follow-up appointment with Dr. Jacinto HalimGanji cardiology regarding your visit today.  If you develop new or worsening symptoms including a change in your chest discomfort, or chest discomfort with shortness of breath, sweating, vomiting, or if the pain radiates to her jaw or all the way down her arm, numbness, weakness, or other concerning symptoms, please return to the emergency department for re-evaluation.  FODMAP stands for fermentable oligo-, di-, mono-saccharides and polyols (1).  These are the scientific terms used to classify groups of carbs that are notorious for triggering digestive symptoms like bloating, gas and stomach pain.  FODMAPs are found in a wide range of foods in varying amounts. Some foods contain just one type, while others contain several.  The main dietary sources of the four groups of FODMAPs include:  Oligosaccharides: Wheat, rye, legumes and various fruits and vegetables, such as garlic and onions. Disaccharides: Milk, yogurt and soft cheese. Lactose is the main carb. Monosaccharides: Various fruit including figs and mangoes, and sweeteners such as honey and agave nectar. Fructose is the main carb. Polyols: Certain fruits and vegetables including blackberries and lychee, as well as some low-calorie sweeteners like those in sugar-free  gum.  A low-FODMAP diet restricts high-FODMAP foods.  The benefits of a low-FODMAP diet have been tested in thousands of people with IBS across more than 30 studies

## 2017-03-27 NOTE — ED Notes (Signed)
Patient verbalizes understanding of discharge instructions. Opportunity for questioning and answers were provided. Armband removed by staff, pt discharged from ED ambulatory.   

## 2017-03-27 NOTE — Telephone Encounter (Signed)
Cristy, NP  Cristy - NP at [facility] is calling to repot she is seeing patient today. Patient is being seen for gas pain- pain behind left breast. She does not have capability to do EKG evaluation and reports she does not feel safe sending patient home with her present symptoms. Told her that we would normally work her through our protocol and if she meet the criteria we would advise her to go to ED. She states that is what she is going to advise her to do. Note to office to let them know.

## 2017-03-27 NOTE — ED Provider Notes (Signed)
[facility] EMERGENCY DEPARTMENT Provider Note   CSN: [id number] Arrival date & time: 03/27/17  0947     History   Chief Complaint Chief Complaint  Patient presents with  . Chest Pain/GERD    HPI Tracy Larsen is a 57 y.o. female history of TIA, MVP, IBS, GERD, asthma, and endometriosis who presents to the emergency department with a chief complaint of left sided chest pain, characterized as sharp, that radiates to the left scapula that has been constant since she awoke yesterday morning.  She states "feels like the gas is sitting and my left chest and it just will not move." She reports associated intermittent nausea, and increased belching and flatus.  No known aggravating or alleviating factors, including taking deep breaths or exertion.  She denies dyspnea, dysphagia, dizziness, weakness, numbness, diaphoresis, leg swelling, palpitations, cough, fever, chills, abdominal pain, vomiting, or diarrhea.  She has treated her symptoms with omeprazole, Tums, and Gas-x with some relief.   She reports that she went to [facility] this morning to obtain medication to get relief from her symptoms, but was told that they did not have the resources to rule out an MI or other cardiac etiology of her symptoms.  The patient attempted to contact her PCP but was told that with her symptoms that they would advise her to go to the ED.  The patient reports that her diet has consisted of chicken and broccoli and protein shakes over the last month.  She states that her husband is scheduled to have a cardiac cath in 4 days, and they have been trying to improve their diet.  She was previously diagnosed with lactose intolerance, but has switched to almond milk.  She reports that sometimes she will drink multiple protein shakes per day.  She reports associated increased belching, flatus, and constipation since her diet was changed a month ago, but nothing as prevalent as her symptoms have been over  the last 24 hours.  Last bowel movement yesterday was soft and normal.  Family history includes her brother who has had 3-4 instances of cardiac arrest, MI, WPW status post ablation, and mitral valve prolapse.  Her brother son has also had an MI and heart surgery.  Patient's mother had heart murmur, but not other h/o if CVD, but she does not know any more information about the maternal side of her family.  She also reports that her father died at age 42 after having cardiac arrest, and the autopsy indicated an MI as the cause of his death.  She states that he never went to her doctor but had never been formally diagnosed with CVD.  The patient was seen by Dr. Macon Large, cardiology, approximately 10 years ago and had a normal stress test and a normal echo at that time.  She was seen by Dr. Jens Som 2 years ago for a repeat echo, which was normal.  She reports that she plans to follow-up soon with her husband's cardiologist Dr. Jacinto Halim.   The history is provided by the patient. No language interpreter was used.    Past Medical History:  Diagnosis Date  . Allergy   . Arthritis    Patient denies.   . Asthma   . Endometriosis   . GERD (gastroesophageal reflux disease)   . Herpes   . HSV (herpes simplex virus) infection   . IBS (irritable bowel syndrome)   . Insomnia   . Mitral valve prolapse   . UTI (urinary tract infection)  chronic history r/t bladder reflux which was repaired,    Patient Active Problem List   Diagnosis Date Noted  . Transient visual disturbance, bilateral 07/02/2015  . IBS (irritable bowel syndrome) 04/22/2013  . Mitral valve prolapse 09/24/2012  . TIA (transient ischemic attack) 09/24/2012    Past Surgical History:  Procedure Laterality Date  . BLADDER REPAIR     AGE 89  . NASAL SINUS SURGERY    . PELVIC LAPAROSCOPY     X 5  . URETHRAL STRICTURE DILATATION      OB History    Gravida Para Term Preterm AB Living   1 1 1     1    SAB TAB Ectopic Multiple Live  Births                   Home Medications    Prior to Admission medications   Medication Sig Start Date End Date Taking? Authorizing Provider  acetaminophen (TYLENOL) 325 MG tablet Take 650 mg by mouth every 6 (six) hours as needed.    [provider]  acyclovir (ZOVIRAX) 400 MG tablet Take 400 mg by mouth 2 (two) times daily. Uses as needed 03/11/11   Fontaine, Nadyne Coombes, MD  cefpodoxime (VANTIN) 200 MG tablet Take 1 tablet by mouth 2 (two) times daily. 08/25/16   [provider]  estradiol (ESTRACE) 1 MG tablet Take 1 mg by mouth daily.    [provider]  fluticasone (FLONASE) 50 MCG/ACT nasal spray Place 2 sprays into both nostrils daily. 02/21/16   Saguier, Ramon Dredge, PA-C  Fluticasone Propionate (FLONASE NA) Place into the nose.    [provider]  GuaiFENesin (MUCINEX PO) Take by mouth.    [provider]  HYDROcodone-homatropine (HYCODAN) 5-1.5 MG/5ML syrup Take 5 mLs by mouth every 6 (six) hours as needed for cough. 02/23/16   Mesner, Barbara Cower, MD  ibuprofen (ADVIL,MOTRIN) 200 MG tablet Take 400 mg by mouth every 6 (six) hours as needed for moderate pain.    [provider]  medroxyPROGESTERone (PROVERA) 2.5 MG tablet Take 2.5 mg by mouth daily.    [provider]  metoCLOPramide (REGLAN) 10 MG tablet Take 1 tablet (10 mg total) by mouth every 6 (six) hours as needed for nausea (nausea/headache). 02/23/16   Mesner, Barbara Cower, MD  Multiple Vitamin (MULTIVITAMIN) tablet Take 1 tablet by mouth daily.    [provider]  omeprazole (PRILOSEC) 40 MG capsule Take 1 capsule (40 mg total) by mouth daily for 14 days. 03/27/17 04/10/17  Quaid Yeakle A, PA-C  triamcinolone cream (KENALOG) 0.1 % Apply 1 application topically 2 (two) times daily. Use as needed for rash 02/27/16   Copland, Gwenlyn Found, MD    Family History Family History  Problem Relation Age of Onset  . Cancer Mother        VULVAR CANCER  . Heart disease Father        MI  in his 96s  . Cancer Brother        SARCOMA  . Heart disease Brother        WPW  . Diabetes Paternal Grandmother   . Colon cancer Neg Hx     Social History Social History   Tobacco Use  . Smoking status: Former Smoker    Last attempt to quit: 01/27/2001    Years since quitting: 16.1  . Smokeless tobacco: Never Used  Substance Use Topics  . Alcohol use: Yes    Alcohol/week: 0.6 oz  Types: 1 Glasses of wine per week  . Drug use: No     Allergies   Augmentin [amoxicillin-pot clavulanate]; Avelox [moxifloxacin hcl in nacl]; Ciprofloxacin; Levofloxacin; Azithromycin; Entex; Erythromycin; Promethazine; Stadol [butorphanol]; Tamiflu [oseltamivir phosphate]; Zofran [ondansetron hcl]; Sulfa antibiotics; and Tetracyclines & related   Review of Systems Review of Systems  Constitutional: Negative for activity change, chills, diaphoresis, fatigue and fever.  HENT:       No dysphagia  Eyes: Negative for visual disturbance.  Respiratory: Negative for cough, choking, chest tightness and shortness of breath.   Cardiovascular: Positive for chest pain.  Gastrointestinal: Positive for constipation and nausea. Negative for abdominal pain, diarrhea, rectal pain and vomiting.       + Belching, flatus  Genitourinary: Negative for dysuria and hematuria.  Musculoskeletal: Negative for back pain, myalgias and neck pain.  Skin: Negative for rash.  Allergic/Immunologic: Negative for immunocompromised state.  Neurological: Negative for dizziness, weakness, light-headedness, numbness and headaches.  Psychiatric/Behavioral: Negative for confusion.   Physical Exam Updated Vital Signs BP (!) 133/98   Pulse (!) 54   Temp 98.1 F (36.7 C) (Oral)   Resp 18   Ht 5\' 7"  (1.702 m)   Wt 86.6 kg (191 lb)   LMP 10/15/2011   SpO2 100%   BMI 29.91 kg/m   Physical Exam  Constitutional: No distress.  HENT:  Head: Normocephalic.  Eyes: Conjunctivae and EOM are normal. Pupils are equal, round, and  reactive to light.  Neck: Neck supple. No JVD present.  Cardiovascular: Regular rhythm, normal heart sounds and intact distal pulses. Exam reveals no gallop and no friction rub.  No murmur heard. Pulses:      Radial pulses are 2+ on the right side, and 2+ on the left side.       Dorsalis pedis pulses are 2+ on the right side, and 2+ on the left side.       Posterior tibial pulses are 2+ on the right side, and 2+ on the left side.  Borderline bradycardia.  Pulmonary/Chest: Effort normal and breath sounds normal. No stridor. No respiratory distress. She has no wheezes. She has no rales. She exhibits no tenderness.  Abdominal: Soft. She exhibits no distension and no mass. There is no tenderness. There is no rebound and no guarding. No hernia.  Musculoskeletal: Normal range of motion. She exhibits no edema, tenderness or deformity.  Neurological: She is alert.  Skin: Skin is warm. Capillary refill takes less than 2 seconds. No rash noted. No erythema. No pallor.  Psychiatric: Her behavior is normal.  Nursing note and vitals reviewed.    ED Treatments / Results  Labs (all labs ordered are listed, but only abnormal results are displayed) Labs Reviewed  BASIC METABOLIC PANEL - Abnormal; Notable for the following components:      Result Value   Glucose, Bld 102 (*)    All other components within normal limits  CBC  I-STAT TROPONIN, ED  I-STAT BETA HCG BLOOD, ED ([facility], [facility], [facility] ONLY)  I-STAT TROPONIN, ED    EKG  EKG Interpretation  Date/Time:  Friday March 27 2017 09:56:41 EST Ventricular Rate:  65 PR Interval:  134 QRS Duration: 78 QT Interval:  388 QTC Calculation: 403 R Axis:   15 Text Interpretation:  Normal sinus rhythm with sinus arrhythmia Poor R wave progression similar to prior 1/18 Confirmed by Meridee Score [id number]) on 03/27/2017 1:40:03 PM       Radiology Dg Chest 2 View  Result Date: 03/27/2017 CLINICAL  DATA:  Sharp CP to left scapula x 1 day. Some sob EXAM: CHEST  2  VIEW COMPARISON:  Chest x-rays dated 08/23/2016, 02/25/2016 and 12/15/2013 FINDINGS: Heart size and mediastinal contours are normal. Lungs are clear. No pleural effusion or pneumothorax seen. Osseous structures about the chest are unremarkable. IMPRESSION: No acute findings. Electronically Signed   By: Bary RichardStan  Maynard M.D.   On: 03/27/2017 10:54    Procedures Procedures (including critical care time)  Medications Ordered in ED Medications  gi cocktail (Maalox,Lidocaine,Donnatal) (30 mLs Oral Given 03/27/17 1353)     Initial Impression / Assessment and Plan / ED Course  I have reviewed the triage vital signs and the nursing notes.  Pertinent labs & imaging results that were available during my care of the patient were reviewed by me and considered in my medical decision making (see chart for details).     Patient is to be discharged with recommendation to follow up with cardiology in regards to today's hospital visit. Chest pain is not likely of cardiac or pulmonary etiology d/t presentation, PERC negative, VSS, no tracheal deviation, no JVD or new murmur, RRR, breath sounds equal bilaterally, EKG without acute abnormalities, including WPW, heat block, or Brugada pathology, negative delta troponin, and negative CXR.  Patient has sinus bradycardia on the monitor in the high 50s to low 60s with a deviation and her pulse when looking at the pulse from the pulse oximeter versus EKG measurement.  Rate is higher with pulse on the EKG monitor.  Reviewing the patient's chart however, this appears to be close to patient's baseline.    Patient was given a GI cocktail in the ED with improvement in her symptoms.  Pt has been advised to return to the ED if CP becomes exertional, associated with diaphoresis or nausea, radiates to left jaw/arm, worsens or becomes concerning in any way. Pt appears reliable for follow up and is agreeable to discharge.  Suspect GERD.  Will discharge the patient with a 2-week trial of  omeprazole and follow-up to GI if no improvement in her symptoms as well as follow-up to Dr. Jacinto HalimGanji and cardiology.  Case has been discussed with Dr. Charm BargesButler who agrees with the above plan to discharge.   Final Clinical Impressions(s) / ED Diagnoses   Final diagnoses:  Gastroesophageal reflux disease, esophagitis presence not specified    ED Discharge Orders        Ordered    omeprazole (PRILOSEC) 40 MG capsule  Daily     03/27/17 1423       Jonmarc Bodkin A, PA-C 03/27/17 1440    Terrilee FilesButler, Michael C, MD 03/28/17 1123

## 2017-03-27 NOTE — Telephone Encounter (Signed)
Noted  

## 2017-03-27 NOTE — ED Notes (Signed)
Patient ambulatory to bathroom with steady gait at this time 

## 2017-03-27 NOTE — ED Triage Notes (Signed)
Patient complains of Sharp CP to left scapula x 1 day. Patient belching on arrival but denies nausea. States that she has used gas x and omeprazole with minimal relief. No SOB. Patient also reports 1 episode of diarrhea last night

## 2017-03-27 NOTE — Progress Notes (Signed)
Chest Pain: Patient complains of chest pain. Onset was 1 day ago, with unchanged course since that time. The patient describes the pain as intermittent, sharp in nature, does not radiate. Patient rates pain as a 5/10 in intensity.  Associated symptoms are fatigue, diarrhea episode x 1.  Patient states pain feels like it is sitting on her chest and won't move.  Aggravating factors are emotional stress.  Alleviating factors are: patient took Gas-X with some relief.. Patient's cardiac risk factors are none.  Patient's risk factors for DVT/PE: none. Previous cardiac testing: none.  Patient states she is under a lot of stress at work.  Reviewed patient's past medical history, allergies and medications.  Review of Systems  Constitutional: Negative for chills, diaphoresis and fever.       Mild distress noted, patient rubbing left breast  HENT: Negative.   Eyes: Negative.   Respiratory: Negative.   Cardiovascular: Positive for chest pain (pain behind left breast, non radiating).  Gastrointestinal: Positive for diarrhea and heartburn.       Belching  Skin: Negative.    Physical Exam  Constitutional: She is oriented to person, place, and time. She appears well-developed and well-nourished. She appears distressed.  HENT:  Head: Normocephalic.  Right Ear: External ear normal.  Left Ear: External ear normal.  Mouth/Throat: Oropharynx is clear and moist.  Eyes: Conjunctivae and EOM are normal. Pupils are equal, round, and reactive to light.  Neck: Normal range of motion. Neck supple. No tracheal deviation present. No thyromegaly present.  Cardiovascular: Normal rate, regular rhythm and normal heart sounds.  Pulmonary/Chest: Breath sounds normal. She is in respiratory distress. She has no wheezes. She has no rales.  SOB due to discomfort in chest  Abdominal: Soft. Bowel sounds are normal. She exhibits no distension. There is no tenderness. There is no guarding.  Neurological: She is alert and oriented to  person, place, and time. No cranial nerve deficit.  Skin: Skin is warm and dry. Capillary refill takes less than 2 seconds. There is pallor.  Psychiatric: She has a normal mood and affect. Her behavior is normal.   Assessment: Chest Pain: Called patient's PCP office at patient's request after discussing lack of resources at [facility] to rule out MI or other cardiac symptoms and that she would need to go ER. Spoke with Erskine SquibbJane, RN informing of patient's symptoms and Erskine SquibbJane stated if patient presented with same/similar symptoms, she most likely would be referred to ER.  Informed patient of same and she is concerned regarding the cost of an ER visit.  Discussed with patient at length regarding her symptoms and how females in her age group present with GI symptoms, but end up having MI. Patient verbalized understanding. Patient instructed to go to ER at this time.

## 2017-05-22 ENCOUNTER — Ambulatory Visit: Payer: 59

## 2017-05-26 ENCOUNTER — Ambulatory Visit (INDEPENDENT_AMBULATORY_CARE_PROVIDER_SITE_OTHER): Payer: 59

## 2017-05-26 DIAGNOSIS — Z111 Encounter for screening for respiratory tuberculosis: Secondary | ICD-10-CM

## 2017-05-26 NOTE — Progress Notes (Signed)
Patient in today for her PPD skin test She will come back on Thursday 5/2/19to have it resulted.

## 2017-05-28 ENCOUNTER — Ambulatory Visit: Payer: 59

## 2017-05-28 ENCOUNTER — Encounter: Payer: Self-pay | Admitting: Family Medicine

## 2017-05-28 DIAGNOSIS — Z111 Encounter for screening for respiratory tuberculosis: Secondary | ICD-10-CM

## 2017-05-28 LAB — TB SKIN TEST
Induration: 0 mm
TB Skin Test: NEGATIVE

## 2017-05-28 NOTE — Progress Notes (Signed)
Patients PPD results are negative

## 2017-06-16 ENCOUNTER — Ambulatory Visit (INDEPENDENT_AMBULATORY_CARE_PROVIDER_SITE_OTHER): Payer: Self-pay | Admitting: *Deleted

## 2017-06-16 ENCOUNTER — Telehealth: Payer: Self-pay | Admitting: *Deleted

## 2017-06-16 DIAGNOSIS — Z111 Encounter for screening for respiratory tuberculosis: Secondary | ICD-10-CM

## 2017-06-16 NOTE — Telephone Encounter (Signed)
Pt on nurse visit schedule today for a PPD skin test. Left detailed message with pt to confirm if 2nd PPD skin test is needed today as she had a PPD placed on 05/26/17.

## 2017-06-16 NOTE — Telephone Encounter (Signed)
Patient called to confirm nurse visit today for 2nd PPD skin test. She does need 2nd test and will be at appointment today.

## 2017-06-16 NOTE — Progress Notes (Signed)
Nursing note reviewed. Agree with documention and plan.  

## 2017-06-16 NOTE — Progress Notes (Signed)
Patient of Dr Patsy Lager here for 2nd PPD skin test for employment purposes. Employer will read PPD result and fax result to [place].  PPD 0.32mL given, ID right arm and pt tolerated procedure well. Immunization report given to pt for employer.  Note routed to covering Provider (Dr Abner Greenspan) for review in PCP's absence.

## 2017-09-30 ENCOUNTER — Other Ambulatory Visit (INDEPENDENT_AMBULATORY_CARE_PROVIDER_SITE_OTHER): Payer: BLUE CROSS/BLUE SHIELD

## 2017-09-30 ENCOUNTER — Ambulatory Visit: Payer: BLUE CROSS/BLUE SHIELD | Admitting: Family

## 2017-09-30 ENCOUNTER — Ambulatory Visit (INDEPENDENT_AMBULATORY_CARE_PROVIDER_SITE_OTHER)
Admission: RE | Admit: 2017-09-30 | Discharge: 2017-09-30 | Disposition: A | Discharge status: Home / Self Care | Payer: BLUE CROSS/BLUE SHIELD | Source: Ambulatory Visit | Attending: Family | Admitting: Family

## 2017-09-30 ENCOUNTER — Telehealth: Payer: Self-pay | Admitting: Family Medicine

## 2017-09-30 ENCOUNTER — Ambulatory Visit: Payer: Self-pay | Admitting: *Deleted

## 2017-09-30 ENCOUNTER — Encounter: Payer: Self-pay | Admitting: Family

## 2017-09-30 VITALS — BP 110/76 | HR 64 | Temp 97.7°F | Ht 67.0 in | Wt 192.1 lb

## 2017-09-30 DIAGNOSIS — R001 Bradycardia, unspecified: Secondary | ICD-10-CM | POA: Diagnosis not present

## 2017-09-30 DIAGNOSIS — R0602 Shortness of breath: Secondary | ICD-10-CM | POA: Diagnosis not present

## 2017-09-30 DIAGNOSIS — R5383 Other fatigue: Secondary | ICD-10-CM

## 2017-09-30 LAB — COMPREHENSIVE METABOLIC PANEL
ALK PHOS: 74 U/L (ref 39–117)
ALT: 7 U/L (ref 0–35)
AST: 10 U/L (ref 0–37)
Albumin: 4 g/dL (ref 3.5–5.2)
BILIRUBIN TOTAL: 0.3 mg/dL (ref 0.2–1.2)
BUN: 13 mg/dL (ref 6–23)
CO2: 32 mEq/L (ref 19–32)
Calcium: 9 mg/dL (ref 8.4–10.5)
Chloride: 106 mEq/L (ref 96–112)
Creatinine, Ser: 0.93 mg/dL (ref 0.40–1.20)
GFR: 66.32 mL/min (ref >60.00)
GLUCOSE: 103 mg/dL — AB (ref 70–99)
Potassium: 4.4 mEq/L (ref 3.5–5.1)
SODIUM: 140 meq/L (ref 135–145)
TOTAL PROTEIN: 7.1 g/dL (ref 6.0–8.3)

## 2017-09-30 LAB — URINALYSIS
Bilirubin Urine: NEGATIVE
Ketones, ur: NEGATIVE
LEUKOCYTES UA: NEGATIVE
Nitrite: NEGATIVE
Total Protein, Urine: NEGATIVE
UROBILINOGEN UA: 0.2 (ref 0.0–1.0)
Urine Glucose: NEGATIVE
pH: 6 (ref 5.0–8.0)

## 2017-09-30 LAB — CBC WITH DIFFERENTIAL/PLATELET
Basophils Absolute: 0.1 10*3/uL (ref 0.0–0.1)
Basophils Relative: 1 % (ref 0.0–3.0)
Eosinophils Absolute: 0.1 10*3/uL (ref 0.0–0.7)
Eosinophils Relative: 2.5 % (ref 0.0–5.0)
HCT: 39.8 % (ref 36.0–46.0)
HEMOGLOBIN: 13.5 g/dL (ref 12.0–15.0)
LYMPHS ABS: 2.1 10*3/uL (ref 0.7–4.0)
Lymphocytes Relative: 36.1 % (ref 12.0–46.0)
MCHC: 33.9 g/dL (ref 30.0–36.0)
MCV: 87.2 fl (ref 78.0–100.0)
Monocytes Absolute: 0.5 10*3/uL (ref 0.1–1.0)
Monocytes Relative: 8.5 % (ref 3.0–12.0)
Neutro Abs: 2.9 10*3/uL (ref 1.4–7.7)
Neutrophils Relative %: 51.9 % (ref 43.0–77.0)
PLATELETS: 225 10*3/uL (ref 150.0–400.0)
RBC: 4.57 Mil/uL (ref 3.87–5.11)
RDW: 12.7 % (ref 11.5–15.5)
WBC: 5.7 10*3/uL (ref 4.0–10.5)

## 2017-09-30 LAB — TSH: TSH: 1.7 u[IU]/mL (ref 0.35–4.50)

## 2017-09-30 MED ORDER — OMEPRAZOLE 40 MG PO CPDR: 40.0000 mg | DELAYED_RELEASE_CAPSULE | Freq: Every day | ORAL | 0 refills | Status: DC

## 2017-09-30 NOTE — Progress Notes (Signed)
Tracy Larsen is a 57 y.o. female with the following history as recorded in [employer]:  Patient Active Problem List   Diagnosis Date Noted  . Transient visual disturbance, bilateral 07/02/2015  . IBS (irritable bowel syndrome) 04/22/2013  . Mitral valve prolapse 09/24/2012  . TIA (transient ischemic attack) 09/24/2012    Current Outpatient Medications  Medication Sig Dispense Refill  . estradiol (ESTRACE) 1 MG tablet Take 1 mg by mouth daily.    . medroxyPROGESTERone (PROVERA) 2.5 MG tablet Take 2.5 mg by mouth daily.    . acetaminophen (TYLENOL) 325 MG tablet Take 650 mg by mouth every 6 (six) hours as needed.    . acyclovir (ZOVIRAX) 400 MG tablet Take 400 mg by mouth 2 (two) times daily. Uses as needed    . Fluticasone Propionate (FLONASE NA) Place into the nose.    . GuaiFENesin (MUCINEX PO) Take by mouth.    . ibuprofen (ADVIL,MOTRIN) 200 MG tablet Take 400 mg by mouth every 6 (six) hours as needed for moderate pain.    . Multiple Vitamin (MULTIVITAMIN) tablet Take 1 tablet by mouth daily.    . omeprazole (PRILOSEC) 40 MG capsule Take 1 capsule (40 mg total) by mouth daily. 30 capsule 0   Current Facility-Administered Medications  Medication Dose Route Frequency Provider Last Rate Last Dose  . 0.9 %  sodium chloride infusion  500 mL Intravenous Continuous Jacobs, Daniel P, MD        Allergies: Augmentin [amoxicillin-pot clavulanate]; Avelox [moxifloxacin hcl in nacl]; Ciprofloxacin; Levofloxacin; Azithromycin; Entex; Erythromycin; Promethazine; Stadol [butorphanol]; Tamiflu [oseltamivir phosphate]; Zofran [ondansetron hcl]; Sulfa antibiotics; and Tetracyclines & related  Past Medical History:  Diagnosis Date  . Allergy   . Arthritis    Patient denies.   . Asthma   . Endometriosis   . GERD (gastroesophageal reflux disease)   . Herpes   . HSV (herpes simplex virus) infection   . IBS (irritable bowel syndrome)   . Insomnia   . Mitral valve prolapse   . UTI (urinary tract  infection)    chronic history r/t bladder reflux which was repaired,    Past Surgical History:  Procedure Laterality Date  . BLADDER REPAIR     AGE 8  . NASAL SINUS SURGERY    . PELVIC LAPAROSCOPY     X 5  . URETHRAL STRICTURE DILATATION      Family History  Problem Relation Age of Onset  . Cancer Mother        VULVAR CANCER  . Heart disease Father        MI in his 60s  . Cancer Brother        SARCOMA  . Heart disease Brother        WPW  . Diabetes Paternal Grandmother   . Colon cancer Neg Hx     Social History   Tobacco Use  . Smoking status: Former Smoker    Last attempt to quit: 01/27/2001    Years since quitting: 16.6  . Smokeless tobacco: Never Used  Substance Use Topics  . Alcohol use: Yes    Alcohol/week: 1.0 standard drinks    Types: 1 Glasses of wine per week    Subjective:  Patient presents with concerns for sensation of shortness of breath x 1-2 months; describes the sensation as a "nighttime air hunger." No prior history of sleep apnea; patient notes she may sleep for 4 hours at night/ notes her sleep is very restless; notes that she became concerned   when her heart rate was at 50 this morning upon wakening; no chest pain, no palpitations; notes that she had sepsis last summer due to kidney infection-patient has obvious anxiety about this diagnosis/ patient repeatedly mentions the diagnosis of sepsis and how her body has changed since being hospitalized.   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  Head: Normocephalic and atraumatic  Eyes: Sclera and conjunctiva clear; pupils round and reactive to light; extraocular movements intact  Ears: External normal; canals clear; tympanic membranes normal  Oropharynx: Pink, supple. No suspicious lesions  Neck: Supple without thyromegaly, adenopathy  Lungs: Respirations unlabored; clear to auscultation bilaterally without wheeze, rales, rhonchi  CVS exam: normal rate and regular rhythm.  Neurologic: Alert and oriented; speech intact; face symmetrical; moves all extremities well; CNII-XII intact without focal deficit   Assessment:  1. Shortness of breath   2. Other fatigue   3. Bradycardia     Plan:  Suspect numerous factors causing symptoms; have referred patient to pulmonology for further evaluation- suspect combination of GERD, sleep apnea; patient would benefit from pulmonary testing; Will update labs, CXR and U/A today; EKG shows bradycardia- she wants to see Dr. Gangi in follow-up; I have asked her to reach out to her PCP about referral to this cardiologist since we are seeing her for urgent care needs. She will follow-up with her PCP as directed.   No follow-ups on file.  Orders Placed This Encounter  Procedures  . DG Chest 2 View    Standing Status:   Future    Standing Expiration Date:   12/01/2018    Order Specific Question:   Reason for Exam (SYMPTOM  OR DIAGNOSIS REQUIRED)    Answer:   shortness of breath    Order Specific Question:   Is patient pregnant?    Answer:   No    Order Specific Question:   Preferred imaging location?    Answer:   [address]     Order Specific Question:   Radiology Contrast Protocol - do NOT remove file path    Answer:   [email or web address]  . CBC w/Diff    Standing Status:   Future    Number of Occurrences:   1    Standing Expiration Date:   09/30/2018  . Comp Met (CMET)    Standing Status:   Future    Number of Occurrences:   1    Standing Expiration Date:   09/30/2018  . TSH    Standing Status:   Future      Number of Occurrences:   1    Standing Expiration Date:   09/30/2018  . D-Dimer, Quantitative    Standing Status:   Future    Number of Occurrences:   1    Standing Expiration Date:   09/30/2018  . Urinalysis    Standing Status:   Future    Number of Occurrences:   1    Standing Expiration Date:   09/30/2018  . Ambulatory referral to Pulmonology    Referral Priority:   Routine    Referral Type:   Consultation    Referral Reason:   Specialty Services Required    Requested Specialty:   Pulmonary Disease    Number of Visits Requested:   1  . EKG 12-Lead    Requested Prescriptions   Signed Prescriptions Disp Refills  . omeprazole (PRILOSEC) 40 MG capsule 30 capsule 0    Sig: Take 1 capsule (40 mg total) by mouth daily.

## 2017-09-30 NOTE — Telephone Encounter (Signed)
-----   Message from Olive Bass, FNP sent at 09/30/2017  3:16 PM EDT ----- Dr. Patsy Lager,  I saw this patient today with a 2 month history of shortness of breath/ concerns for low pulse rate/ sensation of night-time air hunger. She was told she could see you tomorrow but the patient was uncomfortable waiting another 24 hours.  Her exam was unremarkable in the office/ EKG borderline; I have labs and X-ray pending. I do think she needs a cardiology referral. I wondered if you would want to do the referral to have the communication directed to you. I am happy to do it though. She is specifically asking to see Dr. Nadara Eaton. I did also refer her to pulmonology- suspect combination of GERD/ sleep apnea. I think there is some underlying anxiety as well especially due to her sepsis diagnosis last summer but did not want to upset her further with that discussion. She told me repeatedly during the course of the visit how much her body had changed since she had sepsis last summer. She was visibly anxious in the office.   Thanks- I did ask her to follow-up with you for further issues since we saw her as an Urgent Care visit today.   Ria Clock, FNP

## 2017-09-30 NOTE — Patient Instructions (Addendum)
I think there are numerous issues contributing to your symptoms; please schedule a follow-up with the cardiologist you indicated that you want to see; if his office, needs a referral, please ask Dr. Patsy Lager to do this for you.  I would recommend that you start taking Omeprazole daily- we should also consider having you see a pulmonologist; they can evaluate you for GERD, set up a sleep study and do more extensive lung testing. I have put that referral in place for you.

## 2017-09-30 NOTE — Telephone Encounter (Signed)
Pt called with shortness of breath for 1 month and HR of 50; she says that she tried to sleep with the head of the bed elevated; she also says her heart rate is low normally, but today it was 50; she also says that interrupted sleep at night has been a big thing; recommendations made per nurse triage protocol to include seeing a physician within 24 hours;  the pt normally sees Dr Shela Commons. [place], but she has no availability within timeframe per guidelines; she would like to be seen by a provider today; pt offered and accepted appointment with Ria Clock, [facility], today at 1420; she verbalizes understanding; will route to office for notification of this upcoming appointment.  Reason for Disposition . [1] MILD difficulty breathing (e.g., minimal/no SOB at rest, SOB with walking, pulse <100) AND [2] NEW-onset or WORSE than normal  Answer Assessment - Initial Assessment Questions 1. RESPIRATORY STATUS: "Describe your breathing?" (e.g., wheezing, shortness of breath, unable to speak, severe coughing)      Shortness of breath 2. ONSET: "When did this breathing problem begin?"      1 month ago 3. PATTERN "Does the difficult breathing come and go, or has it been constant since it started?"      constant 4. SEVERITY: "How bad is your breathing?" (e.g., mild, moderate, severe)    - MILD: No SOB at rest, mild SOB with walking, speaks normally in sentences, can lay down, no retractions, pulse < 100.    - MODERATE: SOB at rest, SOB with minimal exertion and prefers to sit, cannot lie down flat, speaks in phrases, mild retractions, audible wheezing, pulse 100-120.    - SEVERE: Very SOB at rest, speaks in single words, struggling to breathe, sitting hunched forward, retractions, pulse > 120      moderate 5. RECURRENT SYMPTOM: "Have you had difficulty breathing before?" If so, ask: "When was the last time?" and "What happened that time?"      Not like this; had sepsis last year and has not been the sam 6. CARDIAC  HISTORY: "Do you have any history of heart disease?" (e.g., heart attack, angina, bypass surgery, angioplasty)      HR normally runs low (50-60s); 50 this morning at 0630 7. LUNG HISTORY: "Do you have any history of lung disease?"  (e.g., pulmonary embolus, asthma, emphysema)     no 8. CAUSE: "What do you think is causing the breathing problem?"      No idea 9. OTHER SYMPTOMS: "Do you have any other symptoms? (e.g., dizziness, runny nose, cough, chest pain, fever)     no 10. PREGNANCY: "Is there any chance you are pregnant?" "When was your last menstrual period?"       No  11. 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

## 2017-10-01 LAB — D-DIMER, QUANTITATIVE: D-Dimer, Quant: 0.37 mcg/mL FEU (ref <0.50)

## 2017-10-02 ENCOUNTER — Encounter: Payer: Self-pay | Admitting: Family Medicine

## 2017-10-02 DIAGNOSIS — R0602 Shortness of breath: Secondary | ICD-10-CM

## 2017-10-30 ENCOUNTER — Other Ambulatory Visit: Payer: Self-pay | Admitting: Family Medicine

## 2017-11-09 DIAGNOSIS — Z23 Encounter for immunization: Secondary | ICD-10-CM | POA: Diagnosis not present

## 2017-12-11 ENCOUNTER — Other Ambulatory Visit: Payer: Self-pay | Admitting: Family Medicine

## 2017-12-12 NOTE — Progress Notes (Addendum)
[facility] [address], [address] [place], [place] [address] [phone] [phone]  Date:  12/14/2017   Name:  Tracy Larsen   DOB:  05/21/1960   MRN:  [id number]  PCP:  Pearline Cablesopland, Jessica C, MD    Chief Complaint: Annual Exam (cardiology referral Gangi, mitral valve, needs stress test)   History of Present Illness:  Tracy Larsen is a 57 y.o. very pleasant female patient who presents with the following:  Here today for a physical exam History of TIA, mitral valve prolapse Former smoker, quit over 15 years ago   I have not seen her since summer of 18 following admission with urosepsis: About 2 weeks ago she got sick with UTI sx- she thought this was run of the mill UTI for her, but she got much sicker at home and her husband called EMS.  Before EMS arrived she took a dose of cipro and felt SOB_- however we are not sure if this might be due to her being so ill in the first place.  She is not happy because cipro has been her go- to drug in the past and she is not sure if she can take it in the future She [facility]  Hep C: due Mammo: done at GYN Pap: per GYN Tdap:2016-employee health  shingrix-she wishes to delay  Flu: done Labs: fasting ?gyn-  Yes, she sees Dr. Moises BloodHorvath,done 12/18 ?did she see cardiology- no,but would like to be referred  I have asked her about TIA in the past: Her history lists TIA, but pt states this is not certain.   She did see a neurologist years ago.  Does not recall exactly what they found It seems that she was told she had a possible TIA back when she was having some eye/vision sx  She did a brain MRI in 2017 which was normal per Dr. Anne HahnWillis Her last formal eye exam was 1.5- 2 years ago  Pt reports that she is still struggling with the memory of being so sick last year.  She has gotten better but states that she seems to have a harder time learning new  things, and she is more stressed and anxious. However, she also has a lot of stressors-  Her husband had heart surgery so there is more stress on her, and she took a more stressful job. She cannot leave this more difficult job due to being the sole breadwinner right now  No history of seizure She is suffering from depression and anxiety She has a 56 year old grand-son who is depending on her and the other GM right now- the mom (her daughter) is not present and dad is in and out of jail.  She and PGM trade taking care of this child Never treated for depression and anxiety in the past but would like to start this now She is upset about her weight Anxiety is a big concern for her right now however so wellbutrin likely not a good fit  No SI   Patient Active Problem List   Diagnosis Date Noted  . Transient visual disturbance, bilateral 07/02/2015  . IBS (irritable bowel syndrome) 04/22/2013  . Mitral valve prolapse 09/24/2012  . TIA (transient ischemic attack) 09/24/2012    Past Medical History:  Diagnosis Date  . Allergy   . Arthritis    Patient denies.   . Asthma   . Endometriosis   .  GERD (gastroesophageal reflux disease)   . Herpes   . HSV (herpes simplex virus) infection   . IBS (irritable bowel syndrome)   . Insomnia   . Mitral valve prolapse   . UTI (urinary tract infection)    chronic history r/t bladder reflux which was repaired,    Past Surgical History:  Procedure Laterality Date  . BLADDER REPAIR     AGE 44  . NASAL SINUS SURGERY    . PELVIC LAPAROSCOPY     X 5  . URETHRAL STRICTURE DILATATION      Social History   Tobacco Use  . Smoking status: Former Smoker    Last attempt to quit: 01/27/2001    Years since quitting: 16.8  . Smokeless tobacco: Never Used  Substance Use Topics  . Alcohol use: Yes    Alcohol/week: 1.0 standard drinks    Types: 1 Glasses of wine per week  . Drug use: No    Family History  Problem Relation Age of Onset  . Cancer Mother         VULVAR CANCER  . Heart disease Father        MI in his 66s  . Cancer Brother        SARCOMA  . Heart disease Brother        WPW  . Diabetes Paternal Grandmother   . Colon cancer Neg Hx     Allergies  Allergen Reactions  . Augmentin [Amoxicillin-Pot Clavulanate] Shortness Of Breath  . Ciprofloxacin Shortness Of Breath and Nausea And Vomiting  . Levofloxacin Shortness Of Breath and Palpitations  . Moxifloxacin Hcl In Nacl Shortness Of Breath, Palpitations and Other (See Comments)  . Azithromycin Other (See Comments)    GI Issues/IBS Exacerbation  . Entex Other (See Comments)    Tongue peeling   . Erythromycin Diarrhea  . Promethazine Other (See Comments)    Made butt feel like it was on fire -- Suppository    . Stadol [Butorphanol] Other (See Comments)    dizziness  . Tamiflu [Oseltamivir Phosphate] Nausea And Vomiting  . Zofran [Ondansetron Hcl] Nausea Only and Other (See Comments)    Makes nausea much worse and dizziness   . Sulfa Antibiotics Rash  . Tetracyclines & Related Rash    Medication list has been reviewed and updated.  Current Outpatient Medications on File Prior to Visit  Medication Sig Dispense Refill  . acetaminophen (TYLENOL) 325 MG tablet Take 650 mg by mouth every 6 (six) hours as needed.    Marland Kitchen acyclovir (ZOVIRAX) 400 MG tablet Take 400 mg by mouth 2 (two) times daily. Uses as needed    . estradiol (ESTRACE) 1 MG tablet Take 1 mg by mouth daily.    . Fluticasone Propionate (FLONASE NA) Place into the nose.    . GuaiFENesin (MUCINEX PO) Take by mouth.    Marland Kitchen ibuprofen (ADVIL,MOTRIN) 200 MG tablet Take 400 mg by mouth every 6 (six) hours as needed for moderate pain.    . medroxyPROGESTERone (PROVERA) 2.5 MG tablet Take 2.5 mg by mouth daily.    . Multiple Vitamin (MULTIVITAMIN) tablet Take 1 tablet by mouth daily.    Marland Kitchen omeprazole (PRILOSEC) 40 MG capsule TAKE 1 CAPSULE BY MOUTH EVERY DAY 15 capsule 0   No current facility-administered medications on  file prior to visit.     Review of Systems:  As per HPI- otherwise negative. No fever or chills    Physical Examination: Vitals:   12/14/17 0830  BP:  118/70  Pulse: 60  Resp: 16  Temp: 97.7 F (36.5 C)  SpO2: 98%   Vitals:   12/14/17 0830  Weight: 193 lb (87.5 kg)  Height: 5\' 7"  (1.702 m)   Body mass index is 30.23 kg/m. Ideal Body Weight: Weight in (lb) to have BMI = 25: 159.3  GEN: WDWN, NAD, Non-toxic, A & O x 3,obese, otherwise looks well  HEENT: Atraumatic, Normocephalic. Neck supple. No masses, No LAD. Bilateral TM wnl, oropharynx normal.  PEERL,EOMI.   Ears and Nose: No external deformity.   CV: RRR, No M/G/R. No JVD. No thrill. No extra heart sounds. PULM: CTA B, no wheezes, crackles, rhonchi. No retractions. No resp. distress. No accessory muscle use. ABD: S, NT, ND. No rebound. No HSM. EXTR: No c/c/e NEURO Normal gait.  PSYCH: Normally interactive. Conversant. Not depressed or anxious appearing.  Calm demeanor.   Assessment and Plan: Physical exam  Encounter for hepatitis C screening test for low risk patient - Plan: Hepatitis C antibody  Screening for hyperlipidemia - Plan: Lipid panel  Immunization due  SOB (shortness of breath) - Plan: Ambulatory referral to Cardiology  Screening for diabetes mellitus - Plan: Hemoglobin A1c  Cold sore - Plan: acyclovir (ZOVIRAX) 400 MG tablet  Stress and adjustment reaction - Plan: sertraline (ZOLOFT) 50 MG tablet  Anxiety and depression - Plan: sertraline (ZOLOFT) 50 MG tablet  Wt Readings from Last 3 Encounters:  12/14/17 193 lb (87.5 kg)  09/30/17 192 lb 1.3 oz (87.1 kg)  03/27/17 191 lb (86.6 kg)   Here today for a CPE Labs pending as above Referral to cardiology - she was referred back in September for SOB but never ended up going  Start on zoloft 50 mg, increase to 100 mg after 2-3 weeks if she is tolerating well Pt is hung up about weight gain - advised her to be kind to herself as she is under a  lot of pressure right now  Will plan further follow- up pending labs.   Signed Abbe Amsterdam, MD  Received her labs so far- message to pt  Results for orders placed or performed in visit on 12/14/17  Lipid panel  Result Value Ref Range   Cholesterol 193 0 - 200 mg/dL   Triglycerides 161.0 0.0 - 149.0 mg/dL   HDL 96.04 >54.09 mg/dL   VLDL 81.1 0.0 - 91.4 mg/dL   LDL Cholesterol 782 (H) 0 - 99 mg/dL   Total CHOL/HDL Ratio 3    NonHDL 131.79   Hemoglobin A1c  Result Value Ref Range   Hgb A1c MFr Bld 5.8 4.6 - 6.5 %   The 10-year ASCVD risk score Denman George DC Jr., et al., 2013) is: 1.6%   Values used to calculate the score:     Age: 60 years     Sex: Female     Is Non-Hispanic African American: No     Diabetic: No     Tobacco smoker: No     Systolic Blood Pressure: 118 mmHg     Is BP treated: No     HDL Cholesterol: 61.7 mg/dL     Total Cholesterol: 193 mg/dL

## 2017-12-14 ENCOUNTER — Encounter: Payer: Self-pay | Admitting: Family Medicine

## 2017-12-14 ENCOUNTER — Ambulatory Visit (INDEPENDENT_AMBULATORY_CARE_PROVIDER_SITE_OTHER): Payer: BLUE CROSS/BLUE SHIELD | Admitting: Family Medicine

## 2017-12-14 VITALS — BP 118/70 | HR 60 | Temp 97.7°F | Resp 16 | Ht 67.0 in | Wt 193.0 lb

## 2017-12-14 DIAGNOSIS — Z23 Encounter for immunization: Secondary | ICD-10-CM

## 2017-12-14 DIAGNOSIS — Z0000 Encounter for general adult medical examination without abnormal findings: Secondary | ICD-10-CM | POA: Diagnosis not present

## 2017-12-14 DIAGNOSIS — Z1159 Encounter for screening for other viral diseases: Secondary | ICD-10-CM | POA: Diagnosis not present

## 2017-12-14 DIAGNOSIS — F329 Major depressive disorder, single episode, unspecified: Secondary | ICD-10-CM

## 2017-12-14 DIAGNOSIS — F419 Anxiety disorder, unspecified: Secondary | ICD-10-CM

## 2017-12-14 DIAGNOSIS — Z131 Encounter for screening for diabetes mellitus: Secondary | ICD-10-CM

## 2017-12-14 DIAGNOSIS — F4329 Adjustment disorder with other symptoms: Secondary | ICD-10-CM

## 2017-12-14 DIAGNOSIS — F32A Depression, unspecified: Secondary | ICD-10-CM

## 2017-12-14 DIAGNOSIS — Z13220 Encounter for screening for lipoid disorders: Secondary | ICD-10-CM

## 2017-12-14 DIAGNOSIS — R0602 Shortness of breath: Secondary | ICD-10-CM

## 2017-12-14 DIAGNOSIS — B001 Herpesviral vesicular dermatitis: Secondary | ICD-10-CM

## 2017-12-14 LAB — HEMOGLOBIN A1C: Hgb A1c MFr Bld: 5.8 % (ref 4.6–6.5)

## 2017-12-14 LAB — LIPID PANEL
Cholesterol: 193 mg/dL (ref 0–200)
HDL: 61.7 mg/dL
LDL Cholesterol: 107 mg/dL — ABNORMAL HIGH (ref 0–99)
NonHDL: 131.79
Total CHOL/HDL Ratio: 3
Triglycerides: 123 mg/dL (ref 0.0–149.0)
VLDL: 24.6 mg/dL (ref 0.0–40.0)

## 2017-12-14 MED ORDER — SERTRALINE HCL 50 MG PO TABS: 50.0000 mg | ORAL_TABLET | Freq: Every day | ORAL | 6 refills | Status: DC

## 2017-12-14 MED ORDER — ACYCLOVIR 400 MG PO TABS: 400.0000 mg | ORAL_TABLET | Freq: Two times a day (BID) | ORAL | 3 refills | Status: DC

## 2017-12-14 NOTE — Patient Instructions (Signed)
It was good to see you today but I am sorry that you are having such a hard time! I will be in touch with your labs Please start on sertraline 50 mg once a day- may increase to 2 pills after 2-3 weeks. Please let me know how this works for you I also referred you to see Dr. Einar Gip  Please give yourself some grace re: your weight.  You have a lot going on right now I would recommend trying to meditate a few minutes a day if you can   Health Maintenance, Female Adopting a healthy lifestyle and getting preventive care can go a long way to promote health and wellness. Talk with your health care provider about what schedule of regular examinations is right for you. This is a good chance for you to check in with your provider about disease prevention and staying healthy. In between checkups, there are plenty of things you can do on your own. Experts have done a lot of research about which lifestyle changes and preventive measures are most likely to keep you healthy. Ask your health care provider for more information. Weight and diet Eat a healthy diet  Be sure to include plenty of vegetables, fruits, low-fat dairy products, and lean protein.  Do not eat a lot of foods high in solid fats, added sugars, or salt.  Get regular exercise. This is one of the most important things you can do for your health. ? Most adults should exercise for at least 150 minutes each week. The exercise should increase your heart rate and make you sweat (moderate-intensity exercise). ? Most adults should also do strengthening exercises at least twice a week. This is in addition to the moderate-intensity exercise.  Maintain a healthy weight  Body mass index (BMI) is a measurement that can be used to identify possible weight problems. It estimates body fat based on height and weight. Your health care provider can help determine your BMI and help you achieve or maintain a healthy weight.  For females 62 years of age and  older: ? A BMI below 18.5 is considered underweight. ? A BMI of 18.5 to 24.9 is normal. ? A BMI of 25 to 29.9 is considered overweight. ? A BMI of 30 and above is considered obese.  Watch levels of cholesterol and blood lipids  You should start having your blood tested for lipids and cholesterol at 56 years of age, then have this test every 5 years.  You may need to have your cholesterol levels checked more often if: ? Your lipid or cholesterol levels are high. ? You are older than 56 years of age. ? You are at high risk for heart disease.  Cancer screening Lung Cancer  Lung cancer screening is recommended for adults 38-1 years old who are at high risk for lung cancer because of a history of smoking.  A yearly low-dose CT scan of the lungs is recommended for people who: ? Currently smoke. ? Have quit within the past 15 years. ? Have at least a 30-pack-year history of smoking. A pack year is smoking an average of one pack of cigarettes a day for 1 year.  Yearly screening should continue until it has been 15 years since you quit.  Yearly screening should stop if you develop a health problem that would prevent you from having lung cancer treatment.  Breast Cancer  Practice breast self-awareness. This means understanding how your breasts normally appear and feel.  It also means doing  regular breast self-exams. Let your health care provider know about any changes, no matter how small.  If you are in your 20s or 30s, you should have a clinical breast exam (CBE) by a health care provider every 1-3 years as part of a regular health exam.  If you are 49 or older, have a CBE every year. Also consider having a breast X-ray (mammogram) every year.  If you have a family history of breast cancer, talk to your health care provider about genetic screening.  If you are at high risk for breast cancer, talk to your health care provider about having an MRI and a mammogram every year.  Breast  cancer gene (BRCA) assessment is recommended for women who have family members with BRCA-related cancers. BRCA-related cancers include: ? Breast. ? Ovarian. ? Tubal. ? Peritoneal cancers.  Results of the assessment will determine the need for genetic counseling and BRCA1 and BRCA2 testing.  Cervical Cancer Your health care provider may recommend that you be screened regularly for cancer of the pelvic organs (ovaries, uterus, and vagina). This screening involves a pelvic examination, including checking for microscopic changes to the surface of your cervix (Pap test). You may be encouraged to have this screening done every 3 years, beginning at age 27.  For women ages 47-65, health care providers may recommend pelvic exams and Pap testing every 3 years, or they may recommend the Pap and pelvic exam, combined with testing for human papilloma virus (HPV), every 5 years. Some types of HPV increase your risk of cervical cancer. Testing for HPV may also be done on women of any age with unclear Pap test results.  Other health care providers may not recommend any screening for nonpregnant women who are considered low risk for pelvic cancer and who do not have symptoms. Ask your health care provider if a screening pelvic exam is right for you.  If you have had past treatment for cervical cancer or a condition that could lead to cancer, you need Pap tests and screening for cancer for at least 20 years after your treatment. If Pap tests have been discontinued, your risk factors (such as having a new sexual partner) need to be reassessed to determine if screening should resume. Some women have medical problems that increase the chance of getting cervical cancer. In these cases, your health care provider may recommend more frequent screening and Pap tests.  Colorectal Cancer  This type of cancer can be detected and often prevented.  Routine colorectal cancer screening usually begins at 56 years of age and  continues through 56 years of age.  Your health care provider may recommend screening at an earlier age if you have risk factors for colon cancer.  Your health care provider may also recommend using home test kits to check for hidden blood in the stool.  A small camera at the end of a tube can be used to examine your colon directly (sigmoidoscopy or colonoscopy). This is done to check for the earliest forms of colorectal cancer.  Routine screening usually begins at age 18.  Direct examination of the colon should be repeated every 5-10 years through 56 years of age. However, you may need to be screened more often if early forms of precancerous polyps or small growths are found.  Skin Cancer  Check your skin from head to toe regularly.  Tell your health care provider about any new moles or changes in moles, especially if there is a change in a mole's  shape or color.  Also tell your health care provider if you have a mole that is larger than the size of a pencil eraser.  Always use sunscreen. Apply sunscreen liberally and repeatedly throughout the day.  Protect yourself by wearing long sleeves, pants, a wide-brimmed hat, and sunglasses whenever you are outside.  Heart disease, diabetes, and high blood pressure  High blood pressure causes heart disease and increases the risk of stroke. High blood pressure is more likely to develop in: ? People who have blood pressure in the high end of the normal range (130-139/85-89 mm Hg). ? People who are overweight or obese. ? People who are African American.  If you are 2-49 years of age, have your blood pressure checked every 3-5 years. If you are 66 years of age or older, have your blood pressure checked every year. You should have your blood pressure measured twice-once when you are at a hospital or clinic, and once when you are not at a hospital or clinic. Record the average of the two measurements. To check your blood pressure when you are not  at a hospital or clinic, you can use: ? An automated blood pressure machine at a pharmacy. ? A home blood pressure monitor.  If you are between 21 years and 45 years old, ask your health care provider if you should take aspirin to prevent strokes.  Have regular diabetes screenings. This involves taking a blood sample to check your fasting blood sugar level. ? If you are at a normal weight and have a low risk for diabetes, have this test once every three years after 56 years of age. ? If you are overweight and have a high risk for diabetes, consider being tested at a younger age or more often. Preventing infection Hepatitis B  If you have a higher risk for hepatitis B, you should be screened for this virus. You are considered at high risk for hepatitis B if: ? You were born in a country where hepatitis B is common. Ask your health care provider which countries are considered high risk. ? Your parents were born in a high-risk country, and you have not been immunized against hepatitis B (hepatitis B vaccine). ? You have HIV or AIDS. ? You use needles to inject street drugs. ? You live with someone who has hepatitis B. ? You have had sex with someone who has hepatitis B. ? You get hemodialysis treatment. ? You take certain medicines for conditions, including cancer, organ transplantation, and autoimmune conditions.  Hepatitis C  Blood testing is recommended for: ? Everyone born from [birth year] through 1964. ? Anyone with known risk factors for hepatitis C.  Sexually transmitted infections (STIs)  You should be screened for sexually transmitted infections (STIs) including gonorrhea and chlamydia if: ? You are sexually active and are younger than 56 years of age. ? You are older than 56 years of age and your health care provider tells you that you are at risk for this type of infection. ? Your sexual activity has changed since you were last screened and you are at an increased risk for chlamydia  or gonorrhea. Ask your health care provider if you are at risk.  If you do not have HIV, but are at risk, it may be recommended that you take a prescription medicine daily to prevent HIV infection. This is called pre-exposure prophylaxis (PrEP). You are considered at risk if: ? You are sexually active and do not regularly use condoms or know  the HIV status of your partner(s). ? You take drugs by injection. ? You are sexually active with a partner who has HIV.  Talk with your health care provider about whether you are at high risk of being infected with HIV. If you choose to begin PrEP, you should first be tested for HIV. You should then be tested every 3 months for as long as you are taking PrEP. Pregnancy  If you are premenopausal and you may become pregnant, ask your health care provider about preconception counseling.  If you may become pregnant, take 400 to 800 micrograms (mcg) of folic acid every day.  If you want to prevent pregnancy, talk to your health care provider about birth control (contraception). Osteoporosis and menopause  Osteoporosis is a disease in which the bones lose minerals and strength with aging. This can result in serious bone fractures. Your risk for osteoporosis can be identified using a bone density scan.  If you are 4 years of age or older, or if you are at risk for osteoporosis and fractures, ask your health care provider if you should be screened.  Ask your health care provider whether you should take a calcium or vitamin D supplement to lower your risk for osteoporosis.  Menopause may have certain physical symptoms and risks.  Hormone replacement therapy may reduce some of these symptoms and risks. Talk to your health care provider about whether hormone replacement therapy is right for you. Follow these instructions at home:  Schedule regular health, dental, and eye exams.  Stay current with your immunizations.  Do not use any tobacco products  including cigarettes, chewing tobacco, or electronic cigarettes.  If you are pregnant, do not drink alcohol.  If you are breastfeeding, limit how much and how often you drink alcohol.  Limit alcohol intake to no more than 1 drink per day for nonpregnant women. One drink equals 12 ounces of beer, 5 ounces of wine, or 1 ounces of hard liquor.  Do not use street drugs.  Do not share needles.  Ask your health care provider for help if you need support or information about quitting drugs.  Tell your health care provider if you often feel depressed.  Tell your health care provider if you have ever been abused or do not feel safe at home. This information is not intended to replace advice given to you by your health care provider. Make sure you discuss any questions you have with your health care provider. Document Released: 07/29/2010 Document Revised: 06/21/2015 Document Reviewed: 10/17/2014 Elsevier Interactive Patient Education  [employer].

## 2017-12-15 LAB — HEPATITIS C ANTIBODY
Hepatitis C Ab: NONREACTIVE
SIGNAL TO CUT-OFF: 0.02 (ref <1.00)

## 2018-02-06 ENCOUNTER — Encounter: Payer: Self-pay | Admitting: Family Medicine

## 2018-02-19 ENCOUNTER — Emergency Department
Admission: EM | Admit: 2018-02-19 | Discharge: 2018-02-19 | Disposition: A | Discharge status: Home / Self Care | Payer: BLUE CROSS/BLUE SHIELD | Attending: Emergency Medicine | Admitting: Emergency Medicine

## 2018-02-19 ENCOUNTER — Encounter: Payer: Self-pay | Admitting: Emergency Medicine

## 2018-02-19 ENCOUNTER — Other Ambulatory Visit: Payer: Self-pay

## 2018-02-19 ENCOUNTER — Emergency Department: Payer: BLUE CROSS/BLUE SHIELD

## 2018-02-19 DIAGNOSIS — Z79899 Other long term (current) drug therapy: Secondary | ICD-10-CM | POA: Diagnosis not present

## 2018-02-19 DIAGNOSIS — R0602 Shortness of breath: Secondary | ICD-10-CM | POA: Diagnosis not present

## 2018-02-19 DIAGNOSIS — B349 Viral infection, unspecified: Secondary | ICD-10-CM | POA: Insufficient documentation

## 2018-02-19 DIAGNOSIS — Z87891 Personal history of nicotine dependence: Secondary | ICD-10-CM | POA: Insufficient documentation

## 2018-02-19 DIAGNOSIS — J45909 Unspecified asthma, uncomplicated: Secondary | ICD-10-CM | POA: Insufficient documentation

## 2018-02-19 DIAGNOSIS — R6883 Chills (without fever): Secondary | ICD-10-CM | POA: Diagnosis not present

## 2018-02-19 DIAGNOSIS — Z8673 Personal history of transient ischemic attack (TIA), and cerebral infarction without residual deficits: Secondary | ICD-10-CM | POA: Diagnosis not present

## 2018-02-19 LAB — CBC WITH DIFFERENTIAL/PLATELET
Abs Immature Granulocytes: 0.02 10*3/uL (ref 0.00–0.07)
Basophils Absolute: 0.1 10*3/uL (ref 0.0–0.1)
Basophils Relative: 1 %
EOS PCT: 2 %
Eosinophils Absolute: 0.2 10*3/uL (ref 0.0–0.5)
HEMATOCRIT: 44.1 % (ref 36.0–46.0)
Hemoglobin: 14.2 g/dL (ref 12.0–15.0)
Immature Granulocytes: 0 %
Lymphocytes Relative: 29 %
Lymphs Abs: 2 10*3/uL (ref 0.7–4.0)
MCH: 29 pg (ref 26.0–34.0)
MCHC: 32.2 g/dL (ref 30.0–36.0)
MCV: 90 fL (ref 80.0–100.0)
Monocytes Absolute: 0.5 10*3/uL (ref 0.1–1.0)
Monocytes Relative: 8 %
Neutro Abs: 4.2 10*3/uL (ref 1.7–7.7)
Neutrophils Relative %: 60 %
Platelets: 243 10*3/uL (ref 150–400)
RBC: 4.9 MIL/uL (ref 3.87–5.11)
RDW: 12 % (ref 11.5–15.5)
WBC: 7 10*3/uL (ref 4.0–10.5)
nRBC: 0 % (ref 0.0–0.2)

## 2018-02-19 LAB — BASIC METABOLIC PANEL
Anion gap: 6 (ref 5–15)
BUN: 15 mg/dL (ref 6–20)
CALCIUM: 8.9 mg/dL (ref 8.9–10.3)
CO2: 26 mmol/L (ref 22–32)
CREATININE: 0.92 mg/dL (ref 0.44–1.00)
Chloride: 107 mmol/L (ref 98–111)
GFR calc Af Amer: >60 mL/min (ref >60)
GFR calc non Af Amer: >60 mL/min (ref >60)
Glucose, Bld: 103 mg/dL — ABNORMAL HIGH (ref 70–99)
Potassium: 4.6 mmol/L (ref 3.5–5.1)
SODIUM: 139 mmol/L (ref 135–145)

## 2018-02-19 LAB — URINALYSIS, COMPLETE (UACMP) WITH MICROSCOPIC
Bilirubin Urine: NEGATIVE
Glucose, UA: NEGATIVE mg/dL
Ketones, ur: NEGATIVE mg/dL
Leukocytes, UA: NEGATIVE
Nitrite: NEGATIVE
Protein, ur: NEGATIVE mg/dL
Specific Gravity, Urine: 1.006 (ref 1.005–1.030)
pH: 6 (ref 5.0–8.0)

## 2018-02-19 LAB — INFLUENZA PANEL BY PCR (TYPE A & B)
Influenza A By PCR: NEGATIVE
Influenza B By PCR: NEGATIVE

## 2018-02-19 LAB — TROPONIN I: Troponin I: <0.03 ng/mL (ref <0.03)

## 2018-02-19 MED ORDER — KETOROLAC TROMETHAMINE 30 MG/ML IJ SOLN
30.0000 mg | Freq: Once | INTRAMUSCULAR | Status: AC
  Administered 2018-02-19: 30 mg via INTRAVENOUS
  Filled 2018-02-19: qty 1

## 2018-02-19 MED ORDER — ONDANSETRON 4 MG PO TBDP: 4.0000 mg | ORAL_TABLET | Freq: Three times a day (TID) | ORAL | 0 refills | Status: DC | PRN

## 2018-02-19 MED ORDER — ONDANSETRON HCL 4 MG/2ML IJ SOLN
4.0000 mg | Freq: Once | INTRAMUSCULAR | Status: AC
  Administered 2018-02-19: 4 mg via INTRAVENOUS
  Filled 2018-02-19: qty 2

## 2018-02-19 MED ORDER — SODIUM CHLORIDE 0.9 % IV SOLN
1000.0000 mL | Freq: Once | INTRAVENOUS | Status: AC
  Administered 2018-02-19: 1000 mL via INTRAVENOUS

## 2018-02-19 NOTE — ED Notes (Signed)
EDP at bedside at this time.  

## 2018-02-19 NOTE — ED Notes (Signed)
Pt up to use the bathroom at this time.  

## 2018-02-19 NOTE — ED Notes (Signed)
Pt alert and oriented X4, active, cooperative, pt in NAD. RR even and unlabored, color WNL.  Pt informed to return if any life threatening symptoms occur.  Discharge and followup instructions reviewed.  

## 2018-02-19 NOTE — ED Notes (Signed)
Pt c/o HA intermittently since Tuesday, pt also c/o nausea and chills with hx of GERD. Pt states SOB with walking short distances at this time. Pt with noted dyspnea with exertion.

## 2018-02-19 NOTE — ED Provider Notes (Signed)
[facility] Emergency Department Provider Note   ____________________________________________    I have reviewed the triage vital signs and the nursing notes.   HISTORY  Chief Complaint Shortness of Breath     HPI Tracy Larsen is a 58 y.o. female who presents with complaints of weakness, fatigue, nausea, headache, myalgias as well as mild shortness of breath.  Patient reports whenever she gets a viral illness she always feels "winded ".  She denies chest pain.  No recent travel.  No calf pain or leg swelling.  No abdominal pain.  No dysuria.  Has not take anything for this.  Symptoms started 2 to 3 days ago, yesterday she thought she was doing better but seem to relapse today  Past Medical History:  Diagnosis Date  . Allergy   . Arthritis    Patient denies.   . Asthma   . Endometriosis   . GERD (gastroesophageal reflux disease)   . Herpes   . HSV (herpes simplex virus) infection   . IBS (irritable bowel syndrome)   . Insomnia   . Mitral valve prolapse   . UTI (urinary tract infection)    chronic history r/t bladder reflux which was repaired,    Patient Active Problem List   Diagnosis Date Noted  . Transient visual disturbance, bilateral 07/02/2015  . IBS (irritable bowel syndrome) 04/22/2013  . Mitral valve prolapse 09/24/2012  . TIA (transient ischemic attack) 09/24/2012    Past Surgical History:  Procedure Laterality Date  . BLADDER REPAIR     AGE 37  . NASAL SINUS SURGERY    . PELVIC LAPAROSCOPY     X 5  . URETHRAL STRICTURE DILATATION      Prior to Admission medications   Medication Sig Start Date End Date Taking? Authorizing Provider  acetaminophen (TYLENOL) 325 MG tablet Take 650 mg by mouth every 6 (six) hours as needed.    [provider]  acyclovir (ZOVIRAX) 400 MG tablet Take 1 tablet (400 mg total) by mouth 2 (two) times daily. Uses as needed 12/14/17   Copland, Gwenlyn Found, MD  estradiol (ESTRACE) 1 MG  tablet Take 1 mg by mouth daily.    [provider]  Fluticasone Propionate (FLONASE NA) Place into the nose.    [provider]  GuaiFENesin (MUCINEX PO) Take by mouth.    [provider]  ibuprofen (ADVIL,MOTRIN) 200 MG tablet Take 400 mg by mouth every 6 (six) hours as needed for moderate pain.    [provider]  medroxyPROGESTERone (PROVERA) 2.5 MG tablet Take 2.5 mg by mouth daily.    [provider]  Multiple Vitamin (MULTIVITAMIN) tablet Take 1 tablet by mouth daily.    [provider]  omeprazole (PRILOSEC) 40 MG capsule TAKE 1 CAPSULE BY MOUTH EVERY DAY 12/11/17   Copland, Gwenlyn Found, MD  ondansetron (ZOFRAN ODT) 4 MG disintegrating tablet Take 1 tablet (4 mg total) by mouth every 8 (eight) hours as needed for nausea or vomiting. 02/19/18   Jene Every, MD  sertraline (ZOLOFT) 50 MG tablet Take 1 tablet (50 mg total) by mouth daily. Increase to 2 pills daily after 2 weeks 12/14/17   Copland, Gwenlyn Found, MD     Allergies Augmentin [amoxicillin-pot clavulanate]; Ciprofloxacin; Levofloxacin; Moxifloxacin hcl in nacl; Azithromycin; Entex; Erythromycin; Promethazine; Stadol [butorphanol]; Tamiflu [oseltamivir phosphate]; Sulfa antibiotics; and Tetracyclines & related  Family History  Problem Relation Age of Onset  . Cancer Mother  VULVAR CANCER  . Heart disease Father        MI in his 27s  . Cancer Brother        SARCOMA  . Heart disease Brother        WPW  . Diabetes Paternal Grandmother   . Colon cancer Neg Hx     Social History Social History   Tobacco Use  . Smoking status: Former Smoker    Last attempt to quit: 01/27/2001    Years since quitting: 17.0  . Smokeless tobacco: Never Used  Substance Use Topics  . Alcohol use: Yes    Alcohol/week: 1.0 standard drinks    Types: 1 Glasses of wine per week  . Drug use: No    Review of Systems  Constitutional: No fever/chills Eyes: No visual changes.  ENT: No  sore throat. Cardiovascular: Denies chest pain. Respiratory: Denies shortness of breath. Gastrointestinal: As above Genitourinary: Negative for dysuria. Musculoskeletal: Negative for back pain.  Myalgias as above Skin: Negative for rash. Neurological: No weakness, positive   ____________________________________________   PHYSICAL EXAM:  VITAL SIGNS: ED Triage Vitals  Enc Vitals Group     BP 02/19/18 1037 120/70     Pulse Rate 02/19/18 1037 66     Resp 02/19/18 1037 18     Temp 02/19/18 1037 97.6 F (36.4 C)     Temp Source 02/19/18 1037 Oral     SpO2 02/19/18 1037 97 %     Weight 02/19/18 1039 83.9 kg (185 lb)     Height 02/19/18 1039 1.676 m (5\' 6" )     Head Circumference --      Peak Flow --      Pain Score 02/19/18 1037 0     Pain Loc --      Pain Edu? --      Excl. in GC? --     Constitutional: Alert and oriented.  Eyes: Conjunctivae are normal.   Nose: No congestion/rhinnorhea. Mouth/Throat: Mucous membranes are moist.    Cardiovascular: Normal rate, regular rhythm. Grossly normal heart sounds.  Good peripheral circulation. Respiratory: Normal respiratory effort.  No retractions. Lungs CTAB. Gastrointestinal: Soft and nontender. No distention.   Musculoskeletal: No lower extremity tenderness nor edema.  Warm and well perfused Neurologic:  Normal speech and language. No gross focal neurologic deficits are appreciated.  Skin:  Skin is warm, dry and intact. No rash noted. Psychiatric: Mood and affect are normal. Speech and behavior are normal.  ____________________________________________   LABS (all labs ordered are listed, but only abnormal results are displayed)  Labs Reviewed  BASIC METABOLIC PANEL - Abnormal; Notable for the following components:      Result Value   Glucose, Bld 103 (*)    All other components within normal limits  URINALYSIS, COMPLETE (UACMP) WITH MICROSCOPIC - Abnormal; Notable for the following components:   Color, Urine STRAW  (*)    APPearance CLEAR (*)    Hgb urine dipstick SMALL (*)    Bacteria, UA RARE (*)    All other components within normal limits  TROPONIN I  CBC WITH DIFFERENTIAL/PLATELET  INFLUENZA PANEL BY PCR (TYPE A & B)   ____________________________________________  EKG  ED ECG REPORT I, Jene Every, the attending physician, personally viewed and interpreted this ECG.  Date: 02/19/2018  Rhythm: normal sinus rhythm QRS Axis: normal Intervals: normal ST/T Wave abnormalities: normal Narrative Interpretation: no evidence of acute ischemia  ____________________________________________  RADIOLOGY  Chest x-ray normal ____________________________________________   PROCEDURES  Procedure(s) performed: No  Procedures   Critical Care performed: No ____________________________________________   INITIAL IMPRESSION / ASSESSMENT AND PLAN / ED COURSE  Pertinent labs & imaging results that were available during my care of the patient were reviewed by me and considered in my medical decision making (see chart for details).  Patient well-appearing and in no acute distress.  Exam is quite reassuring.  Vital signs unremarkable, mild bradycardia appears chronic.  Lab work is reassuring, negative influenza, normal labs, normal chest x-ray, normal urinalysis.  Patient treated with IV fluids, IV Zofran and IV Toradol which greatly improved her symptoms.  Appropriate for outpatient follow-up as needed, strict return precautions discussed    ____________________________________________   FINAL CLINICAL IMPRESSION(S) / ED DIAGNOSES  Final diagnoses:  Viral illness  Shortness of breath        Note:  This document was prepared using Dragon voice recognition software and may include unintentional dictation errors.   Jene EveryKinner, Baily Serpe, MD 02/19/18 [phone]

## 2018-02-19 NOTE — ED Notes (Signed)
ED Provider at bedside. 

## 2018-02-19 NOTE — ED Triage Notes (Signed)
Started with headache, nausea, and chills Tuesday.  C/o congestion for last month.  Pt reports for past 2 days she gets winded if walking short distances.  +orthopnea.  Has been sleeping elevated but still feels SHOB at times.  No vomiting.  Doctor sent to ED for the [facility].  Denies any pain at this time.  Has had some generalized weakness.

## 2018-02-19 NOTE — ED Notes (Signed)
Pt called this RN to bedside to loosen the BP cuff. BP cuff loosened per patient request, explained cuff would have to stay on. Pt states bilateral shoulder injuries and she "just can't keep it on tight". Will continue to monitor for further patient needs.

## 2018-03-04 ENCOUNTER — Ambulatory Visit: Payer: Self-pay | Admitting: Cardiology

## 2018-03-07 DIAGNOSIS — K5900 Constipation, unspecified: Secondary | ICD-10-CM | POA: Diagnosis not present

## 2018-03-07 DIAGNOSIS — R1084 Generalized abdominal pain: Secondary | ICD-10-CM | POA: Diagnosis not present

## 2018-03-07 DIAGNOSIS — Z79899 Other long term (current) drug therapy: Secondary | ICD-10-CM | POA: Diagnosis not present

## 2018-03-07 DIAGNOSIS — K581 Irritable bowel syndrome with constipation: Secondary | ICD-10-CM | POA: Diagnosis not present

## 2018-03-10 ENCOUNTER — Encounter (HOSPITAL_BASED_OUTPATIENT_CLINIC_OR_DEPARTMENT_OTHER): Payer: Self-pay | Admitting: Emergency Medicine

## 2018-03-10 ENCOUNTER — Other Ambulatory Visit: Payer: Self-pay

## 2018-03-10 ENCOUNTER — Emergency Department (HOSPITAL_BASED_OUTPATIENT_CLINIC_OR_DEPARTMENT_OTHER)
Admission: EM | Admit: 2018-03-10 | Discharge: 2018-03-10 | Disposition: A | Discharge status: Home / Self Care | Payer: BLUE CROSS/BLUE SHIELD | Attending: Emergency Medicine | Admitting: Emergency Medicine

## 2018-03-10 ENCOUNTER — Emergency Department (HOSPITAL_BASED_OUTPATIENT_CLINIC_OR_DEPARTMENT_OTHER): Payer: BLUE CROSS/BLUE SHIELD

## 2018-03-10 ENCOUNTER — Ambulatory Visit: Payer: BLUE CROSS/BLUE SHIELD | Admitting: Family Medicine

## 2018-03-10 ENCOUNTER — Encounter: Payer: Self-pay | Admitting: Family Medicine

## 2018-03-10 VITALS — BP 110/78 | HR 74 | Temp 97.8°F | Resp 16 | Ht 66.0 in | Wt 194.0 lb

## 2018-03-10 DIAGNOSIS — Z87891 Personal history of nicotine dependence: Secondary | ICD-10-CM | POA: Diagnosis not present

## 2018-03-10 DIAGNOSIS — Z79899 Other long term (current) drug therapy: Secondary | ICD-10-CM | POA: Insufficient documentation

## 2018-03-10 DIAGNOSIS — K5900 Constipation, unspecified: Secondary | ICD-10-CM | POA: Diagnosis not present

## 2018-03-10 DIAGNOSIS — R1084 Generalized abdominal pain: Secondary | ICD-10-CM

## 2018-03-10 DIAGNOSIS — K449 Diaphragmatic hernia without obstruction or gangrene: Secondary | ICD-10-CM | POA: Diagnosis not present

## 2018-03-10 DIAGNOSIS — J45909 Unspecified asthma, uncomplicated: Secondary | ICD-10-CM | POA: Insufficient documentation

## 2018-03-10 DIAGNOSIS — K529 Noninfective gastroenteritis and colitis, unspecified: Secondary | ICD-10-CM

## 2018-03-10 LAB — URINALYSIS, ROUTINE W REFLEX MICROSCOPIC
BILIRUBIN URINE: NEGATIVE
Glucose, UA: NEGATIVE mg/dL
Ketones, ur: NEGATIVE mg/dL
Leukocytes,Ua: NEGATIVE
Nitrite: NEGATIVE
Protein, ur: NEGATIVE mg/dL
Specific Gravity, Urine: 1.015 (ref 1.005–1.030)
pH: 7.5 (ref 5.0–8.0)

## 2018-03-10 LAB — COMPREHENSIVE METABOLIC PANEL
ALT: 12 U/L (ref 0–44)
AST: 16 U/L (ref 15–41)
Albumin: 4 g/dL (ref 3.5–5.0)
Alkaline Phosphatase: 84 U/L (ref 38–126)
Anion gap: 8 (ref 5–15)
BUN: 12 mg/dL (ref 6–20)
CO2: 27 mmol/L (ref 22–32)
Calcium: 9.3 mg/dL (ref 8.9–10.3)
Chloride: 107 mmol/L (ref 98–111)
Creatinine, Ser: 0.96 mg/dL (ref 0.44–1.00)
GFR calc Af Amer: >60 mL/min (ref >60)
GFR calc non Af Amer: >60 mL/min (ref >60)
Glucose, Bld: 100 mg/dL — ABNORMAL HIGH (ref 70–99)
Potassium: 4.7 mmol/L (ref 3.5–5.1)
Sodium: 142 mmol/L (ref 135–145)
Total Bilirubin: 0.4 mg/dL (ref 0.3–1.2)
Total Protein: 7.6 g/dL (ref 6.5–8.1)

## 2018-03-10 LAB — CBC
HCT: 44.2 % (ref 36.0–46.0)
Hemoglobin: 13.6 g/dL (ref 12.0–15.0)
MCH: 28.3 pg (ref 26.0–34.0)
MCHC: 30.8 g/dL (ref 30.0–36.0)
MCV: 91.9 fL (ref 80.0–100.0)
Platelets: 264 10*3/uL (ref 150–400)
RBC: 4.81 MIL/uL (ref 3.87–5.11)
RDW: 11.8 % (ref 11.5–15.5)
WBC: 6.1 10*3/uL (ref 4.0–10.5)
nRBC: 0 % (ref 0.0–0.2)

## 2018-03-10 LAB — URINALYSIS, MICROSCOPIC (REFLEX)

## 2018-03-10 LAB — LIPASE, BLOOD: Lipase: 29 U/L (ref 11–51)

## 2018-03-10 LAB — PREGNANCY, URINE: Preg Test, Ur: NEGATIVE

## 2018-03-10 LAB — POCT URINE PREGNANCY: Preg Test, Ur: NEGATIVE

## 2018-03-10 MED ORDER — IOPAMIDOL (ISOVUE-300) INJECTION 61%
100.0000 mL | Freq: Once | INTRAVENOUS | Status: AC | PRN
  Administered 2018-03-10: 100 mL via INTRAVENOUS

## 2018-03-10 MED ORDER — ONDANSETRON 4 MG PO TBDP: 4.0000 mg | ORAL_TABLET | Freq: Three times a day (TID) | ORAL | 0 refills | Status: DC | PRN

## 2018-03-10 MED ORDER — IOPAMIDOL (ISOVUE-300) INJECTION 61%
30.0000 mL | Freq: Once | INTRAVENOUS | Status: AC | PRN
  Administered 2018-03-10: 15 mL via ORAL

## 2018-03-10 MED ORDER — CIPROFLOXACIN HCL 500 MG PO TABS: 500.0000 mg | ORAL_TABLET | Freq: Two times a day (BID) | ORAL | 0 refills | Status: DC

## 2018-03-10 NOTE — Progress Notes (Signed)
[facility] [address], [address] [place], [place] [address] [phone] [phone]  Date:  03/10/2018   Name:  Tracy Larsen   DOB:  06/09/1960   MRN:  [id number]  PCP:  Pearline Cablesopland, Kattie Santoyo C, MD    Chief Complaint: Follow-up (would like CT, bowel block, started sunday, 3 weeks without normal bowel movement, tried laxatives stool softeners-little help, tried an enema)   History of Present Illness:  Tracy Mingseresa H Charney is a 58 y.o. very pleasant female patient who presents with the following:  She is here today with lack of stool production for 3 weeks She has used stool softeners off and on, but is still getting very little stool; what she does pass is "thin ribbons" She went to UC in [place] on Sunday; they did an xray and said that she was "full of stool," and they directed her to the ER  They gave her mag citrate in the ER and she went home; declined to do an enema there as she was in a curtained room.  She did do an enema at home but no result Last night she did an enema but only got "bown water" out  She is not vomiting She has not had any nausea generally She is still eating some but is "afraid to eat" She noted generalized abd discomfort and bloating  She was in the ER on 02/19/2018 with nausea and not feeling well (told that she had ?norovirus but she was not having diarrhea); she got some IV zofran but was not taking any anti-diarrheals otherwise   She uses align which helps with her bloating and gas, but has caused some mild constipation in the past  She had an impaction 8 years ago due to imodium use- has avoided imodium since then  She is not taking any constipating meds at this time   She is menopausal- LMP was 7 years ago  Accompanied by her husband today who contributes to the history   Patient Active Problem List   Diagnosis Date Noted  . Transient visual disturbance, bilateral 07/02/2015  . IBS (irritable bowel  syndrome) 04/22/2013  . Mitral valve prolapse 09/24/2012  . TIA (transient ischemic attack) 09/24/2012    Past Medical History:  Diagnosis Date  . Allergy   . Arthritis    Patient denies.   . Asthma   . Endometriosis   . GERD (gastroesophageal reflux disease)   . Herpes   . HSV (herpes simplex virus) infection   . IBS (irritable bowel syndrome)   . Insomnia   . Mitral valve prolapse   . UTI (urinary tract infection)    chronic history r/t bladder reflux which was repaired,    Past Surgical History:  Procedure Laterality Date  . BLADDER REPAIR     AGE 15  . NASAL SINUS SURGERY    . PELVIC LAPAROSCOPY     X 5  . URETHRAL STRICTURE DILATATION      Social History   Tobacco Use  . Smoking status: Former Smoker    Last attempt to quit: 01/27/2001    Years since quitting: 17.1  . Smokeless tobacco: Never Used  Substance Use Topics  . Alcohol use: Yes    Alcohol/week: 1.0 standard drinks    Types: 1 Glasses of wine per week  . Drug use: No    Family History  Problem Relation Age of Onset  . Cancer Mother  VULVAR CANCER  . Heart disease Father        MI in his 4360s  . Cancer Brother        SARCOMA  . Heart disease Brother        WPW  . Diabetes Paternal Grandmother   . Colon cancer Neg Hx     Allergies  Allergen Reactions  . Augmentin [Amoxicillin-Pot Clavulanate] Shortness Of Breath  . Ciprofloxacin Shortness Of Breath and Nausea And Vomiting  . Levofloxacin Shortness Of Breath and Palpitations  . Moxifloxacin Hcl In Nacl Shortness Of Breath, Palpitations and Other (See Comments)  . Azithromycin Other (See Comments)    GI Issues/IBS Exacerbation  . Entex Other (See Comments)    Tongue peeling   . Erythromycin Diarrhea  . Promethazine Other (See Comments)    Made butt feel like it was on fire -- Suppository    . Stadol [Butorphanol] Other (See Comments)    dizziness  . Tamiflu [Oseltamivir Phosphate] Nausea And Vomiting  . Sulfa Antibiotics Rash   . Tetracyclines & Related Rash    Medication list has been reviewed and updated.  Current Outpatient Medications on File Prior to Visit  Medication Sig Dispense Refill  . acetaminophen (TYLENOL) 325 MG tablet Take 650 mg by mouth every 6 (six) hours as needed.    Marland Kitchen. acyclovir (ZOVIRAX) 400 MG tablet Take 1 tablet (400 mg total) by mouth 2 (two) times daily. Uses as needed 60 tablet 3  . estradiol (ESTRACE) 1 MG tablet Take 1 mg by mouth daily.    . Fluticasone Propionate (FLONASE NA) Place into the nose.    . ibuprofen (ADVIL,MOTRIN) 200 MG tablet Take 400 mg by mouth every 6 (six) hours as needed for moderate pain.    . medroxyPROGESTERone (PROVERA) 2.5 MG tablet Take 2.5 mg by mouth daily.    . Multiple Vitamin (MULTIVITAMIN) tablet Take 1 tablet by mouth daily.    Marland Kitchen. omeprazole (PRILOSEC) 40 MG capsule TAKE 1 CAPSULE BY MOUTH EVERY DAY 15 capsule 0  . sertraline (ZOLOFT) 50 MG tablet Take 1 tablet (50 mg total) by mouth daily. Increase to 2 pills daily after 2 weeks 60 tablet 6   No current facility-administered medications on file prior to visit.     Review of Systems:  As per HPI- otherwise negative. No fever   Physical Examination: Vitals:   03/10/18 0956  BP: 110/78  Pulse: 74  Resp: 16  Temp: 97.8 F (36.6 C)  SpO2: 98%   Vitals:   03/10/18 0956  Weight: 194 lb (88 kg)  Height: 5\' 6"  (1.676 m)   Body mass index is 31.31 kg/m. Ideal Body Weight: Weight in (lb) to have BMI = 25: 154.6  GEN: WDWN, NAD, Non-toxic, A & O x 3, overweight, looks well  HEENT: Atraumatic, Normocephalic. Neck supple. No masses, No LAD. Ears and Nose: No external deformity. CV: RRR, No M/G/R. No JVD. No thrill. No extra heart sounds. PULM: CTA B, no wheezes, crackles, rhonchi. No retractions. No resp. distress. No accessory muscle use. ABD: S, ND, +BS. No rebound. No HSM.  Mild diffuse tenderness, active bowel sounds No stool in rectal vault  EXTR: No c/c/e NEURO Normal gait.  PSYCH:  Normally interactive. Conversant. Not depressed or anxious appearing.  Calm demeanor.   Results for orders placed or performed in visit on 03/10/18  POCT urine pregnancy  Result Value Ref Range   Preg Test, Ur Negative Negative     Assessment and Plan:  Generalized abdominal pain - Plan: CT Abdomen Pelvis W Contrast, POCT urine pregnancy  Here today with bloating, discomfort and constipation for about 3 weeks She has been to an UC and an outside ER over the last 4 days She has tried magnesium citrate and also enema x2 at home with no real result, passed "brown water" only with her enema  She is requesting a CT scan which is reasonable to rule out an obstruction or mass at this time However we are not able to get scan approved by her insurance plan which means I cannot order the test  She would like to get the CT today, and will go to the ER in hopes of getting this test done I have spoken to the ER about her case and appreciate their care of this patient   Signed Abbe Amsterdam, MD

## 2018-03-10 NOTE — Discharge Instructions (Signed)
Your work-up today showed enteritis and colitis likely causing your constellation of symptoms with abdominal pain, nausea, constipation, and bloating.  Your CT scan did not show obstruction or other significant abnormality.  Your laboratory testing was overall reassuring.  We had a shared decision-making conversation to determine the antibiotic choice.  You elected to go with only Cipro to treat. Please use the Zofran ODT to help with nausea and maintain hydration.  Please follow-up with your PCP and your gastroenterologist for further management.  If any symptoms change or worsen, is return to the nearest emergency department.

## 2018-03-10 NOTE — ED Provider Notes (Signed)
[facility] EMERGENCY DEPARTMENT Provider Note   CSN: [id number] Arrival date & time: 03/10/18  1054     History   Chief Complaint Chief Complaint  Patient presents with  . Constipation    HPI Tracy Larsen is a 58 y.o. female.  The history is provided by the patient and medical records. No language interpreter was used.  Constipation  Severity:  Severe Time since last bowel movement:  3 weeks Timing:  Constant Progression:  Worsening Chronicity:  Recurrent Context: dietary changes   Context: not narcotics   Stool description:  Small Unusual stool frequency:  Less Relieved by:  Nothing Worsened by:  Nothing Ineffective treatments:  Enemas, diet changes, laxatives and stool softeners Associated symptoms: abdominal pain   Associated symptoms: no anorexia, no back pain, no diarrhea, no dysuria, no fever, no nausea, no urinary retention and no vomiting     Past Medical History:  Diagnosis Date  . Allergy   . Arthritis    Patient denies.   . Asthma   . Endometriosis   . GERD (gastroesophageal reflux disease)   . Herpes   . HSV (herpes simplex virus) infection   . IBS (irritable bowel syndrome)   . Insomnia   . Mitral valve prolapse   . UTI (urinary tract infection)    chronic history r/t bladder reflux which was repaired,    Patient Active Problem List   Diagnosis Date Noted  . Transient visual disturbance, bilateral 07/02/2015  . IBS (irritable bowel syndrome) 04/22/2013  . Mitral valve prolapse 09/24/2012  . TIA (transient ischemic attack) 09/24/2012    Past Surgical History:  Procedure Laterality Date  . BLADDER REPAIR     AGE 68  . NASAL SINUS SURGERY    . PELVIC LAPAROSCOPY     X 5  . URETHRAL STRICTURE DILATATION       OB History    Gravida  1   Para  1   Term  1   Preterm      AB      Living  1     SAB      TAB      Ectopic      Multiple      Live Births               Home Medications    Prior to  Admission medications   Medication Sig Start Date End Date Taking? Authorizing Provider  acetaminophen (TYLENOL) 325 MG tablet Take 650 mg by mouth every 6 (six) hours as needed.    [provider]  acyclovir (ZOVIRAX) 400 MG tablet Take 1 tablet (400 mg total) by mouth 2 (two) times daily. Uses as needed 12/14/17   Copland, Gwenlyn FoundJessica C, MD  estradiol (ESTRACE) 1 MG tablet Take 1 mg by mouth daily.    [provider]  Fluticasone Propionate (FLONASE NA) Place into the nose.    [provider]  ibuprofen (ADVIL,MOTRIN) 200 MG tablet Take 400 mg by mouth every 6 (six) hours as needed for moderate pain.    [provider]  medroxyPROGESTERone (PROVERA) 2.5 MG tablet Take 2.5 mg by mouth daily.    [provider]  Multiple Vitamin (MULTIVITAMIN) tablet Take 1 tablet by mouth daily.    [provider]  omeprazole (PRILOSEC) 40 MG capsule TAKE 1 CAPSULE BY MOUTH EVERY DAY 12/11/17   Copland, Gwenlyn FoundJessica C, MD  sertraline (ZOLOFT) 50 MG tablet Take 1 tablet (50 mg total) by  mouth daily. Increase to 2 pills daily after 2 weeks 12/14/17   Copland, Gwenlyn Found, MD    Family History Family History  Problem Relation Age of Onset  . Cancer Mother        VULVAR CANCER  . Heart disease Father        MI in his 58s  . Cancer Brother        SARCOMA  . Heart disease Brother        WPW  . Diabetes Paternal Grandmother   . Colon cancer Neg Hx     Social History Social History   Tobacco Use  . Smoking status: Former Smoker    Last attempt to quit: 01/27/2001    Years since quitting: 17.1  . Smokeless tobacco: Never Used  Substance Use Topics  . Alcohol use: Yes    Alcohol/week: 1.0 standard drinks    Types: 1 Glasses of wine per week  . Drug use: No     Allergies   Augmentin [amoxicillin-pot clavulanate]; Ciprofloxacin; Levofloxacin; Moxifloxacin hcl in nacl; Azithromycin; Entex; Erythromycin; Promethazine; Stadol [butorphanol]; Tamiflu [oseltamivir  phosphate]; Sulfa antibiotics; and Tetracyclines & related   Review of Systems Review of Systems  Constitutional: Negative for chills, diaphoresis, fatigue and fever.  HENT: Negative for congestion.   Eyes: Negative for visual disturbance.  Respiratory: Negative for cough, choking, chest tightness, shortness of breath, wheezing and stridor.   Cardiovascular: Negative for chest pain, palpitations and leg swelling.  Gastrointestinal: Positive for abdominal distention, abdominal pain and constipation. Negative for anorexia, blood in stool, diarrhea, nausea and vomiting.  Genitourinary: Negative for dysuria and flank pain.  Musculoskeletal: Negative for back pain, neck pain and neck stiffness.  Skin: Negative for rash and wound.  Neurological: Negative for light-headedness and headaches.  Psychiatric/Behavioral: Negative for agitation and confusion.  All other systems reviewed and are negative.    Physical Exam Updated Vital Signs BP 130/82 (BP Location: Left Arm)   Pulse 72   Temp 97.9 F (36.6 C) (Oral)   Resp 18   Ht 5\' 6"  (1.676 m)   Wt 88 kg   LMP 10/15/2011   SpO2 99%   BMI 31.31 kg/m   Physical Exam Vitals signs and nursing note reviewed.  Constitutional:      General: She is not in acute distress.    Appearance: She is well-developed. She is not ill-appearing, toxic-appearing or diaphoretic.  HENT:     Head: Normocephalic and atraumatic.  Eyes:     Conjunctiva/sclera: Conjunctivae normal.     Pupils: Pupils are equal, round, and reactive to light.  Neck:     Musculoskeletal: Neck supple.  Cardiovascular:     Rate and Rhythm: Normal rate and regular rhythm.     Pulses: Normal pulses.     Heart sounds: No murmur.  Pulmonary:     Effort: Pulmonary effort is normal. No respiratory distress.     Breath sounds: Normal breath sounds. No wheezing, rhonchi or rales.  Chest:     Chest wall: No tenderness.  Abdominal:     General: There is no distension.      Palpations: Abdomen is soft.     Tenderness: There is abdominal tenderness.  Musculoskeletal:        General: No tenderness.  Skin:    General: Skin is warm and dry.     Capillary Refill: Capillary refill takes less than 2 seconds.  Neurological:     General: No focal deficit present.  Mental Status: She is alert.  Psychiatric:        Mood and Affect: Mood normal.      ED Treatments / Results  Labs (all labs ordered are listed, but only abnormal results are displayed) Labs Reviewed  COMPREHENSIVE METABOLIC PANEL - Abnormal; Notable for the following components:      Result Value   Glucose, Bld 100 (*)    All other components within normal limits  URINALYSIS, ROUTINE W REFLEX MICROSCOPIC - Abnormal; Notable for the following components:   Hgb urine dipstick TRACE (*)    All other components within normal limits  URINALYSIS, MICROSCOPIC (REFLEX) - Abnormal; Notable for the following components:   Bacteria, UA FEW (*)    All other components within normal limits  URINE CULTURE  LIPASE, BLOOD  CBC  PREGNANCY, URINE    EKG None  Radiology Ct Abdomen Pelvis W Contrast  Result Date: 03/10/2018 CLINICAL DATA:  58 year old female with a history of constipation EXAM: CT ABDOMEN AND PELVIS WITH CONTRAST TECHNIQUE: Multidetector CT imaging of the abdomen and pelvis was performed using the standard protocol following bolus administration of intravenous contrast. CONTRAST:  ISOVUE-300 IOPAMIDOL (ISOVUE-300) INJECTION 61%, 15mL ISOVUE-300 IOPAMIDOL (ISOVUE-300) INJECTION 61% COMPARISON:  08/23/2016, 11/13/2014 FINDINGS: Lower chest: No acute abnormality. Hepatobiliary: There are multiple low-density indeterminate lesions of the liver. All of these were present on the CT dated 02/2013, unchanged. Unremarkable gallbladder. Pancreas: Unremarkable pancreas Spleen: Unremarkable spleen Adrenals/Urinary Tract: Unremarkable adrenal glands. Right kidney demonstrates several regions of  cortical thinning. No inflammatory changes. No hydronephrosis. Low-density cystic structure on the inferior renal cortex compatible with Bosniak 1 cyst. Extrarenal pelvis. Left kidney with no hydronephrosis or nephrolithiasis. Unremarkable course of the left ureter. Unremarkable urinary bladder. Stomach/Bowel: Unremarkable appearance of the stomach. Small hiatal hernia. Unremarkable small bowel. No abnormal distention. Enteric contrast traverses the length of small bowel. No transition point. No focal inflammation. Normal appendix. The colon is fluid-filled throughout its length. No transition. No formed stool within the colon. No focal inflammatory changes or wall thickening. Vascular/Lymphatic: No significant vascular findings are present. No enlarged abdominal or pelvic lymph nodes. Reproductive: Unremarkable uterus and adnexa Other: Non Musculoskeletal: No acute displaced fracture. No significant degenerative changes. IMPRESSION: The length of the colon is fluid-filled, which may indicate nonspecific enteritis/colitis. Recommend correlation with any symptoms. No evidence of bowel obstruction. Normal appendix. Multiple regions of cortical thinning of the right kidney, potentially representing prior episodes of infarction/infection. Small hiatal Electronically Signed   By: Gilmer Mor D.O.   On: 03/10/2018 14:18    Procedures Procedures (including critical care time)  Medications Ordered in ED Medications  iopamidol (ISOVUE-300) 61 % injection 30 mL (15 mLs Oral Contrast Given 03/10/18 1300)  iopamidol (ISOVUE-300) 61 % injection 100 mL (100 mLs Intravenous Contrast Given 03/10/18 1350)     Initial Impression / Assessment and Plan / ED Course  I have reviewed the triage vital signs and the nursing notes.  Pertinent labs & imaging results that were available during my care of the patient were reviewed by me and considered in my medical decision making (see chart for details).     Tracy Larsen is a 58 y.o. female with a past medical history significant for-year-old bowel syndrome, prior TIA, GERD, asthma, endometriosis, and prior stool impaction who presents from her PCP office for abdominal pain, abdominal distention, and constipation.  Patient reports that for the last 3 weeks she has not been having bowel movements.  She  is having severe constipation.  She says her abdomen is feeling bloated and slightly distended and she is having pain intermittently.  She reports she still eating and drinking okay without significant nausea or vomiting.  She reports she has tried stool softeners and enemas.  She is only gotten water out with no stool.  She says that this happened 8 years ago when she had impaction.  Spoke with the PCP who saw her earlier today upstairs and patient had a rectal exam performed with no stool palpable in the vault for manual removal.  Patient is extremely concerned about impaction or bowel obstruction.  She denies fevers, chills, urinary symptoms or diarrhea.  She denies recent trauma.  She denies any chest pain, shortness breath, palpitations or back pain.  No other complaints.  She denies vaginal bleeding, vaginal discharge, or pelvic pain.  Of note, patient does report that she has been doing some dietary changes over the last few weeks but stopped last Friday.  She was taking different shakes.  She thinks this may be stopping her up.   On exam, patient has mild abdominal tenderness.  No CVA tenderness or back tenderness.  Lungs clear.  Chest nontender.  Patient has no significant edema or tenderness in her extremities.  As patient was sent from PCP for CT scan to rule out obstruction, this will be ordered.  Laboratory system is delayed today however will get creatinine and she will have a CT scan with both oral and IV contrast.  Patient does report that before her constipation she was having thin pencil like stools.  CT will also look for diverticulitis, obstruction,  or mass.  If CT scan shows constipation, she will likely be appropriate for discharge with gastroenterology follow-up for possible colonoscopy for stool caliber abnormality.  If no obstruction or other significant value seen, she will likely be a candidate for prescription of oral medication such as MiraLAX or GoLYTELY to take at home.  Patient awaiting CT imaging.  CT imaging showed concern for fluid-filled colon likely consistent with enteritis and colitis.  No obstruction or transition point seen.  Patient reports no recent antibiotics, doubt C. difficile.  Given patient's symptoms, we felt she is appropriate for outpatient antibiotic treatment for the colitis and enteritis.  Patient had numerous allergies and intolerances to antibiotics.  Pharmacy went spoke with her and felt that Cipro and Flagyl was the most appropriate for her versus Augmentin.  Patient reports that she will refuse the Augmentin and the Flagyl but she will take the Cipro.  Patient advised that this is incomplete treatment for enteritis however she her decision-making conversation led to this being the only antibiotic she will be taking.  Patient will give prescription for the Cipro as well as prescription for Zofran ODT.  Patient will follow-up with both her gastroenterologist and her PCP for further management.  Patient agreed with plan of care and had no other questions or concerns.  Patient was discharged in good condition.  Final Clinical Impressions(s) / ED Diagnoses   Final diagnoses:  Enteritis  Colitis  Constipation, unspecified constipation type  Generalized abdominal pain    ED Discharge Orders         Ordered    ciprofloxacin (CIPRO) 500 MG tablet  2 times daily     03/10/18 1623    ondansetron (ZOFRAN ODT) 4 MG disintegrating tablet  Every 8 hours PRN     03/10/18 1624         Clinical Impression:  1. Enteritis   2. Colitis   3. Constipation, unspecified constipation type   4. Generalized  abdominal pain     Disposition: Discharge  Condition: Good  I have discussed the results, Dx and Tx plan with the pt(& family if present). He/she/they expressed understanding and agree(s) with the plan. Discharge instructions discussed at great length. Strict return precautions discussed and pt &/or family have verbalized understanding of the instructions. No further questions at time of discharge.    New Prescriptions   CIPROFLOXACIN (CIPRO) 500 MG TABLET    Take 1 tablet (500 mg total) by mouth 2 (two) times daily.   ONDANSETRON (ZOFRAN ODT) 4 MG DISINTEGRATING TABLET    Take 1 tablet (4 mg total) by mouth every 8 (eight) hours as needed for nausea or vomiting.    Follow Up: Pearline Cables, MD [address] [place] [address] [phone]     [facility] [address] [id number] BD ZHGD [place] [address] [phone]    Your gastroenterologist        Tegeler, Canary Brim, MD 03/10/18 1626

## 2018-03-10 NOTE — ED Triage Notes (Signed)
Reports constipation x 3 weeks.  Denies vomiting. Sent by PCP.

## 2018-03-10 NOTE — ED Notes (Signed)
Patient eating when I went out to waiting room.  Instructed patient not to eat or drink until seen by EDP.

## 2018-03-11 LAB — URINE CULTURE: Culture: <10000 — AB

## 2018-03-15 ENCOUNTER — Encounter: Payer: Self-pay | Admitting: Family Medicine

## 2018-03-24 ENCOUNTER — Ambulatory Visit: Payer: BLUE CROSS/BLUE SHIELD | Admitting: Gastroenterology

## 2018-03-24 ENCOUNTER — Encounter: Payer: Self-pay | Admitting: Gastroenterology

## 2018-03-24 VITALS — BP 130/72 | HR 60 | Ht 66.0 in | Wt 198.8 lb

## 2018-03-24 DIAGNOSIS — K5900 Constipation, unspecified: Secondary | ICD-10-CM | POA: Diagnosis not present

## 2018-03-24 NOTE — Patient Instructions (Addendum)
Continue your probiotic for now. Call if constipation returns.  Thank you for entrusting me with your care and choosing [facility].  Dr Christella Hartigan

## 2018-03-24 NOTE — Progress Notes (Signed)
HPI: This is a pleasant 58 year old woman whom I last saw the time of a screening colonoscopy about 2 years ago.  I do not believe I have ever seen her here in the office.  Chief complaint is recent significant constipation  Normal for her since mid 7140s, alternates constipation/loose stools.  Can have urgency with caffeine, grease. 2 days without BM.  She easily becomes constipated.  She tells me even with over-the-counter Tylenol or over-the-counter NSAIDs she becomes constipated.  About 7 or 8 weeks ago she had extreme nausea and was told that she had norovirus.  Around that time she had started taking Atkins high-protein shakes.  She noticed mild constipation setting in and then when she was told that she had this norovirus despite not having diarrhea or vomiting, she was given Toradol and Tylenol which always tend to constipate her.  Gave herself an enema just prior to going to the ER.  She was given cipro for the 'colitis' noted on CT below.  BID 500mg  cipro for 5 days.  Has been on a probiotic three years ago, but stopped it for several months.  Restarted it 7 days ago.  Having BM every day now, sometimes BId  Old Data Reviewed:  CT scan abdomen and pelvis with IV and oral contrast February 2020 : the length of the colon is fluid-filled, which may indicate nonspecific enteritis/colitis. Recommend correlation with any symptoms. No evidence of bowel obstruction. Normal appendix. Multiple regions of cortical thinning of the right kidney, potentially representing prior episodes of infarction/infection. Small hiatal  Blood work February 2020 normal CBC, normal complete metabolic profile, normal lipase.  Pregnancy test negative.  Colonoscopy January 2018 Dr. Christella HartiganJacobs for routine risk colon cancer screening was lately normal.  She was recommended to have repeat colonoscopy at 10-year interval.      Review of systems: Pertinent positive and negative review of systems were noted in the  above HPI section. All other review negative.   Past Medical History:  Diagnosis Date  . Allergy   . Arthritis    Patient denies.   . Asthma   . Endometriosis   . GERD (gastroesophageal reflux disease)   . Herpes   . HSV (herpes simplex virus) infection   . IBS (irritable bowel syndrome)   . Insomnia   . Mitral valve prolapse   . UTI (urinary tract infection)    chronic history r/t bladder reflux which was repaired,    Past Surgical History:  Procedure Laterality Date  . BLADDER REPAIR     AGE 56  . NASAL SINUS SURGERY    . PELVIC LAPAROSCOPY     X 5  . URETHRAL STRICTURE DILATATION      Current Outpatient Medications  Medication Sig Dispense Refill  . acetaminophen (TYLENOL) 325 MG tablet Take 650 mg by mouth every 6 (six) hours as needed.    Marland Kitchen. acyclovir (ZOVIRAX) 400 MG tablet Take 1 tablet (400 mg total) by mouth 2 (two) times daily. Uses as needed 60 tablet 3  . estradiol (ESTRACE) 1 MG tablet Take 1 mg by mouth daily.    . Fluticasone Propionate (FLONASE NA) Place into the nose.    . ibuprofen (ADVIL,MOTRIN) 200 MG tablet Take 400 mg by mouth every 6 (six) hours as needed for moderate pain.    . medroxyPROGESTERone (PROVERA) 2.5 MG tablet Take 2.5 mg by mouth daily.    . Multiple Vitamin (MULTIVITAMIN) tablet Take 1 tablet by mouth daily.    .Marland Kitchen  omeprazole (PRILOSEC) 40 MG capsule TAKE 1 CAPSULE BY MOUTH EVERY DAY 15 capsule 0  . Probiotic CAPS Take 1 capsule by mouth daily. Renewlife     No current facility-administered medications for this visit.     Allergies as of 03/24/2018 - Review Complete 03/24/2018  Allergen Reaction Noted  . Azithromycin Other (See Comments) 02/25/2016  . Entex Other (See Comments) 07/24/2010  . Erythromycin Diarrhea 07/24/2010  . Promethazine Other (See Comments) 09/28/2013  . Stadol [butorphanol] Other (See Comments) 01/25/2016  . Tamiflu [oseltamivir phosphate] Nausea And Vomiting 02/21/2016  . Zofran [ondansetron hcl] Nausea Only  and Other (See Comments) 09/28/2013  . Augmentin [amoxicillin-pot clavulanate] Other (See Comments) 12/15/2012  . Ciprofloxacin Other (See Comments) 09/03/2016  . Levofloxacin Other (See Comments) 07/24/2010  . Moxifloxacin hcl in nacl Other (See Comments) 03/11/2011  . Sulfa antibiotics Rash 06/03/2010  . Tetracyclines & related Rash 06/03/2010    Family History  Problem Relation Age of Onset  . Cancer Mother        VULVAR CANCER  . Heart disease Father        MI in his 50s  . Cancer Brother        SARCOMA  . Heart disease Brother        WPW  . Diabetes Paternal Grandmother   . Colon cancer Neg Hx     Social History   Socioeconomic History  . Marital status: Married    Spouse name: Not on file  . Number of children: 1  . Years of education: Not on file  . Highest education level: Not on file  Occupational History  . 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 Financial resource strain: Not on file  . Food insecurity:    Worry: Not on file    Inability: Not on file  . Transportation needs:    Medical: Not on file    Non-medical: Not on file  Tobacco Use  . Smoking status: Former Smoker    Last attempt to quit: 01/27/2001    Years since quitting: 17.1  . Smokeless tobacco: Never Used  Substance and Sexual Activity  . Alcohol use: Yes    Alcohol/week: 1.0 standard drinks    Types: 1 Glasses of wine per week  . Drug use: No  . Sexual activity: Yes    Birth control/protection: None  Lifestyle  . Physical activity:    Days per week: Not on file    Minutes per session: Not on file  . Stress: Not on file  Relationships  . Social connections:    Talks on phone: Not on file    Gets together: Not on file    Attends religious service: Not on file    Active member of club or organization: Not on file    Attends meetings of clubs or organizations: Not on file    Relationship status: Not on file  . Intimate partner violence:     Fear of current or ex partner: Not on file    Emotionally abused: Not on file    Physically abused: Not on file    Forced sexual activity: Not on file  Other Topics Concern  . Not on file  Social History Narrative   Lives at home w/ husband and daughter and grandson   Right-handed   Drinks 2 glasses of tea daily     Physical Exam: BP 130/72   Pulse 60   Ht 5'  6" (1.676 m)   Wt 198 lb 12.8 oz (90.2 kg)   LMP 10/15/2011   BMI 32.09 kg/m  Constitutional: generally well-appearing Psychiatric: alert and oriented x3 Eyes: extraocular movements intact Mouth: oral pharynx moist, no lesions Neck: supple no lymphadenopathy Cardiovascular: heart regular rate and rhythm Lungs: clear to auscultation bilaterally Abdomen: soft, nontender, nondistended, no obvious ascites, no peritoneal signs, normal bowel sounds Extremities: no lower extremity edema bilaterally Skin: no lesions on visible extremities   Assessment and plan: 58 y.o. female with recent severe constipation  I believe that her recent 3 weeks of constipation was likely due to high protein shake diet and then viral illness followed by pain medicines which always tend to constipate her.  She was told by an emergency room physician that she had a colitis based on the radiologist reading of her CT scan.  I dispute the fact that she had colitis.  She did have fluid in her colon but that does not mean that she had colitis.  Indeed she never had a diarrheal illness, no fevers, her white count was normal.  It took a while to convince her that she did not have an infection despite the fact that she was told that by a physician and despite the fact that she was given antibiotics for said infection.  For the past week to 10 days she has been back on a probiotic which she has used in the past and her bowels are moving much more normally now.  She is quite happy with the way her bowels are moving now.  I recommended that she continue taking  that probiotic.  We reviewed her colonoscopy which was 2 years ago and it was completely normal.  I discussed the fact that if she starts to have serious constipation again that a colonoscopy is certainly a reasonable next step.  She will call if that is the case.   Please see the "Patient Instructions" section for addition details about the plan.   Rob Bunting, MD [facility] 03/24/2018, 1:43 PM  Cc: Pearline Cables, MD

## 2018-04-29 ENCOUNTER — Ambulatory Visit: Payer: Self-pay

## 2018-04-29 NOTE — Telephone Encounter (Signed)
Incoming call from Patient who reports on yesterday she felt dizzy, and had diarrhea and fatigue. Today Patient states that she has the same Sx. 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  Her husband is high risk .  Related that the offices are not screening for Covid -19 Patient lives in [place].  Provided Contact number to [facility].  Patirnt voices gratitude.

## 2018-06-18 ENCOUNTER — Other Ambulatory Visit: Payer: Self-pay | Admitting: Family Medicine

## 2018-06-18 ENCOUNTER — Encounter: Payer: Self-pay | Admitting: Family Medicine

## 2018-06-21 MED ORDER — OMEPRAZOLE 40 MG PO CPDR: DELAYED_RELEASE_CAPSULE | ORAL | 3 refills | Status: DC

## 2018-08-13 DIAGNOSIS — Z6830 Body mass index (BMI) 30.0-30.9, adult: Secondary | ICD-10-CM | POA: Diagnosis not present

## 2018-08-13 DIAGNOSIS — Z1231 Encounter for screening mammogram for malignant neoplasm of breast: Secondary | ICD-10-CM | POA: Diagnosis not present

## 2018-08-13 DIAGNOSIS — N390 Urinary tract infection, site not specified: Secondary | ICD-10-CM | POA: Diagnosis not present

## 2018-08-13 DIAGNOSIS — Z01419 Encounter for gynecological examination (general) (routine) without abnormal findings: Secondary | ICD-10-CM | POA: Diagnosis not present

## 2018-09-06 DIAGNOSIS — Z0000 Encounter for general adult medical examination without abnormal findings: Secondary | ICD-10-CM | POA: Diagnosis not present

## 2018-11-09 ENCOUNTER — Ambulatory Visit: Payer: Self-pay

## 2018-11-09 NOTE — Telephone Encounter (Signed)
Outgoing call to Pt. Who reports that she got a flu shot.on Sunday.  Most of the day was nauseated,  Took dramamine. OTC Slept most of the day.   B/P was decreased. slept with head up.  States  Meclizine makes it worse.  Patient would like to know is  The any otc or prescribed medication she can use for hep with the vertigo      Reason for Disposition . [1] MODERATE dizziness (e.g., vertigo; feels very unsteady, interferes with normal activities) AND [2] has been evaluated by physician for this  Answer Assessment - Initial Assessment Questions 1. DESCRIPTION: "Describe your dizziness."     *No Answer* 2. VERTIGO: "Do you feel like either you or the room is spinning or tilting?"      *No Answer* 3. LIGHTHEADED: "Do you feel lightheaded?" (e.g., somewhat faint, woozy, weak upon standing)     *No Answer* 4. SEVERITY: "How bad is it?"  "Can you walk?"   - MILD - Feels unsteady but walking normally.   - MODERATE - Feels very unsteady when walking, but not falling; interferes with normal activities (e.g., school, work) .   - SEVERE - Unable to walk without falling (requires assistance).     *No Answer* 5. ONSET:  "When did the dizziness begin?"     *No Answer* 6. AGGRAVATING FACTORS: "Does anything make it worse?" (e.g., standing, change in head position)      7. CAUSE: "What do you think is causing the dizziness?"     *No Answer* 8. RECURRENT SYMPTOM: "Have you had dizziness before?" If so, ask: "When was the last time?" "What happened that time?"     *No Answer* 9. OTHER SYMPTOMS: "Do you have any other symptoms?" (e.g., headache, weakness, numbness, vomiting, earache)     Headache ,  10. PREGNANCY: "Is there any chance you are pregnant?" "When was your last menstrual period?"       na  Protocols used: DIZZINESS - VERTIGO-A-AH

## 2018-11-09 NOTE — Telephone Encounter (Signed)
   Reason for Disposition . [1] MILD dizziness (e.g., vertigo; walking normally) AND [2] has NOT been evaluated by physician for this  Protocols used: DIZZINESS - VERTIGO-A-AH

## 2018-11-10 ENCOUNTER — Other Ambulatory Visit: Payer: Self-pay

## 2018-11-10 ENCOUNTER — Encounter: Payer: Self-pay | Admitting: Emergency Medicine

## 2018-11-10 ENCOUNTER — Emergency Department
Admission: EM | Admit: 2018-11-10 | Discharge: 2018-11-10 | Disposition: A | Discharge status: Home / Self Care | Payer: BLUE CROSS/BLUE SHIELD | Attending: Emergency Medicine | Admitting: Emergency Medicine

## 2018-11-10 DIAGNOSIS — R110 Nausea: Secondary | ICD-10-CM

## 2018-11-10 DIAGNOSIS — Z87891 Personal history of nicotine dependence: Secondary | ICD-10-CM | POA: Insufficient documentation

## 2018-11-10 DIAGNOSIS — Z79899 Other long term (current) drug therapy: Secondary | ICD-10-CM | POA: Diagnosis not present

## 2018-11-10 DIAGNOSIS — Z8673 Personal history of transient ischemic attack (TIA), and cerebral infarction without residual deficits: Secondary | ICD-10-CM | POA: Diagnosis not present

## 2018-11-10 DIAGNOSIS — H8110 Benign paroxysmal vertigo, unspecified ear: Secondary | ICD-10-CM | POA: Insufficient documentation

## 2018-11-10 DIAGNOSIS — H8111 Benign paroxysmal vertigo, right ear: Secondary | ICD-10-CM | POA: Diagnosis not present

## 2018-11-10 DIAGNOSIS — R42 Dizziness and giddiness: Secondary | ICD-10-CM | POA: Diagnosis present

## 2018-11-10 LAB — CBC WITH DIFFERENTIAL/PLATELET
Abs Immature Granulocytes: 0.01 10*3/uL (ref 0.00–0.07)
Basophils Absolute: 0.1 10*3/uL (ref 0.0–0.1)
Basophils Relative: 1 %
Eosinophils Absolute: 0.3 10*3/uL (ref 0.0–0.5)
Eosinophils Relative: 4 %
HCT: 43.1 % (ref 36.0–46.0)
Hemoglobin: 14.1 g/dL (ref 12.0–15.0)
Immature Granulocytes: 0 %
Lymphocytes Relative: 31 %
Lymphs Abs: 2.2 10*3/uL (ref 0.7–4.0)
MCH: 29.5 pg (ref 26.0–34.0)
MCHC: 32.7 g/dL (ref 30.0–36.0)
MCV: 90.2 fL (ref 80.0–100.0)
Monocytes Absolute: 0.5 10*3/uL (ref 0.1–1.0)
Monocytes Relative: 7 %
Neutro Abs: 4 10*3/uL (ref 1.7–7.7)
Neutrophils Relative %: 57 %
Platelets: 214 10*3/uL (ref 150–400)
RBC: 4.78 MIL/uL (ref 3.87–5.11)
RDW: 12.5 % (ref 11.5–15.5)
WBC: 7 10*3/uL (ref 4.0–10.5)
nRBC: 0 % (ref 0.0–0.2)

## 2018-11-10 LAB — URINALYSIS, COMPLETE (UACMP) WITH MICROSCOPIC
Bacteria, UA: NONE SEEN
Bilirubin Urine: NEGATIVE
Glucose, UA: NEGATIVE mg/dL
Ketones, ur: NEGATIVE mg/dL
Leukocytes,Ua: NEGATIVE
Nitrite: NEGATIVE
Protein, ur: NEGATIVE mg/dL
Specific Gravity, Urine: 1.018 (ref 1.005–1.030)
pH: 5 (ref 5.0–8.0)

## 2018-11-10 LAB — BASIC METABOLIC PANEL
Anion gap: 9 (ref 5–15)
BUN: 15 mg/dL (ref 6–20)
CO2: 26 mmol/L (ref 22–32)
Calcium: 9 mg/dL (ref 8.9–10.3)
Chloride: 105 mmol/L (ref 98–111)
Creatinine, Ser: 0.9 mg/dL (ref 0.44–1.00)
GFR calc Af Amer: >60 mL/min (ref >60)
GFR calc non Af Amer: >60 mL/min (ref >60)
Glucose, Bld: 142 mg/dL — ABNORMAL HIGH (ref 70–99)
Potassium: 4.4 mmol/L (ref 3.5–5.1)
Sodium: 140 mmol/L (ref 135–145)

## 2018-11-10 MED ORDER — ONDANSETRON HCL 4 MG/2ML IJ SOLN
4.0000 mg | Freq: Once | INTRAMUSCULAR | Status: AC
  Administered 2018-11-10: 4 mg via INTRAVENOUS
  Filled 2018-11-10: qty 2

## 2018-11-10 MED ORDER — DIAZEPAM 2 MG PO TABS: 2.0000 mg | ORAL_TABLET | Freq: Two times a day (BID) | ORAL | 0 refills | Status: DC | PRN

## 2018-11-10 MED ORDER — DIAZEPAM 5 MG PO TABS
5.0000 mg | ORAL_TABLET | Freq: Once | ORAL | Status: AC
  Administered 2018-11-10: 5 mg via ORAL
  Filled 2018-11-10: qty 1

## 2018-11-10 MED ORDER — LACTATED RINGERS IV BOLUS
1000.0000 mL | Freq: Once | INTRAVENOUS | Status: AC
  Administered 2018-11-10: 09:00:00 1000 mL via INTRAVENOUS

## 2018-11-10 NOTE — ED Triage Notes (Signed)
Pt reports no pain in her head at this time but when the pain is there it is a pressure feeling.

## 2018-11-10 NOTE — ED Provider Notes (Signed)
[facility] Emergency Department Provider Note   ____________________________________________   First MD Initiated Contact with Patient 11/10/18 0805     (approximate)  I have reviewed the triage vital signs and the nursing notes.   HISTORY  Chief Complaint Dizziness, Headache, and Nausea    HPI Tracy Larsen is a 58 y.o. female with past medical history of IBS, GERD, and vertigo who presents to the ED complaining of dizziness.  Patient reports that she has been feeling bad since she got her flu shot 4 days ago.  She describes severe dizziness whenever she moves her head, which is associated with nausea.  She did have a headache 3 days ago, but this improved after a dose of Tylenol.  She states she attempted to go to work earlier today, but was feeling too dizzy to drive and decided to get checked out.  She denies any fevers and has not had any vomiting or diarrhea, denies urinary symptoms.  She also denies any cough, chest pain, or shortness of breath.  She states she has dealt with vertigo in the past but that meclizine has made her symptoms worse.        Past Medical History:  Diagnosis Date  . Allergy   . Arthritis    Patient denies.   . Asthma   . Endometriosis   . GERD (gastroesophageal reflux disease)   . Herpes   . HSV (herpes simplex virus) infection   . IBS (irritable bowel syndrome)   . Insomnia   . Mitral valve prolapse   . UTI (urinary tract infection)    chronic history r/t bladder reflux which was repaired,    Patient Active Problem List   Diagnosis Date Noted  . Transient visual disturbance, bilateral 07/02/2015  . IBS (irritable bowel syndrome) 04/22/2013  . Mitral valve prolapse 09/24/2012  . TIA (transient ischemic attack) 09/24/2012    Past Surgical History:  Procedure Laterality Date  . BLADDER REPAIR     AGE 24  . NASAL SINUS SURGERY    . PELVIC LAPAROSCOPY     X 5  . URETHRAL STRICTURE DILATATION       Prior to Admission medications   Medication Sig Start Date End Date Taking? Authorizing Provider  acetaminophen (TYLENOL) 325 MG tablet Take 650 mg by mouth every 6 (six) hours as needed.    [provider]  acyclovir (ZOVIRAX) 400 MG tablet Take 1 tablet (400 mg total) by mouth 2 (two) times daily. Uses as needed 12/14/17   Copland, Gay Filler, MD  diazepam (VALIUM) 2 MG tablet Take 1 tablet (2 mg total) by mouth every 12 (twelve) hours as needed (Vertigo). 11/10/18 12/10/18  Blake Divine, MD  estradiol (ESTRACE) 1 MG tablet Take 1 mg by mouth daily.    [provider]  Fluticasone Propionate (FLONASE NA) Place into the nose.    [provider]  ibuprofen (ADVIL,MOTRIN) 200 MG tablet Take 400 mg by mouth every 6 (six) hours as needed for moderate pain.    [provider]  medroxyPROGESTERone (PROVERA) 2.5 MG tablet Take 2.5 mg by mouth daily.    [provider]  Multiple Vitamin (MULTIVITAMIN) tablet Take 1 tablet by mouth daily.    [provider]  omeprazole (PRILOSEC) 40 MG capsule TAKE 1 CAPSULE BY MOUTH EVERY DAY 06/23/18   Copland, Gay Filler, MD  omeprazole (PRILOSEC) 40 MG capsule TAKE 1 CAPSULE BY MOUTH EVERY DAY 06/21/18   Copland, Gay Filler, MD  Probiotic CAPS Take 1 capsule by mouth daily. [employer]    [provider]    Allergies Azithromycin, Entex, Erythromycin, Promethazine, Stadol [butorphanol], Tamiflu [oseltamivir phosphate], Zofran [ondansetron hcl], Augmentin [amoxicillin-pot clavulanate], Ciprofloxacin, Levofloxacin, Moxifloxacin hcl in nacl, Sulfa antibiotics, and Tetracyclines & related  Family History  Problem Relation Age of Onset  . Cancer Mother        VULVAR CANCER  . Heart disease Father        MI in his 3560s  . Cancer Brother        SARCOMA  . Heart disease Brother        WPW  . Diabetes Paternal Grandmother   . Colon cancer Neg Hx     Social History Social History   Tobacco Use  .  Smoking status: Former Smoker    Quit date: 01/27/2001    Years since quitting: 17.7  . Smokeless tobacco: Never Used  Substance Use Topics  . Alcohol use: Yes    Alcohol/week: 1.0 standard drinks    Types: 1 Glasses of wine per week  . Drug use: No    Review of Systems  Constitutional: No fever/chills Eyes: No visual changes. ENT: No sore throat. Cardiovascular: Denies chest pain. Respiratory: Denies shortness of breath. Gastrointestinal: No abdominal pain.  Positive for nausea, no vomiting.  No diarrhea.  No constipation. Genitourinary: Negative for dysuria. Musculoskeletal: Negative for back pain. Skin: Negative for rash. Neurological: Negative for headaches, focal weakness or numbness.  Positive for dizziness.  ____________________________________________   PHYSICAL EXAM:  VITAL SIGNS: ED Triage Vitals [11/10/18 0801]  Enc Vitals Group     BP      Pulse      Resp      Temp      Temp src      SpO2      Weight 194 lb (88 kg)     Height 5\' 6"  (1.676 m)     Head Circumference      Peak Flow      Pain Score 0     Pain Loc      Pain Edu?      Excl. in GC?     Constitutional: Alert and oriented. Eyes: Conjunctivae are normal.  Pupils equal round and reactive to light bilaterally, extraocular movements intact. Head: Atraumatic. Nose: No congestion/rhinnorhea. Mouth/Throat: Mucous membranes are moist. Neck: Normal ROM Cardiovascular: Normal rate, regular rhythm. Grossly normal heart sounds. Respiratory: Normal respiratory effort.  No retractions. Lungs CTAB. Gastrointestinal: Soft and nontender. No distention. Genitourinary: deferred Musculoskeletal: No lower extremity tenderness nor edema. Neurologic:  Normal speech and language. No gross focal neurologic deficits are appreciated.  5 out of 5 strength in bilateral upper and lower extremities, no pronator drift. Skin:  Skin is warm, dry and intact. No rash noted. Psychiatric: Mood and affect are normal. Speech  and behavior are normal.  ____________________________________________   LABS (all labs ordered are listed, but only abnormal results are displayed)  Labs Reviewed  BASIC METABOLIC PANEL - Abnormal; Notable for the following components:      Result Value   Glucose, Bld 142 (*)    All other components within normal limits  URINALYSIS, COMPLETE (UACMP) WITH MICROSCOPIC - Abnormal; Notable for the following components:   Color, Urine YELLOW (*)    APPearance HAZY (*)    Hgb urine dipstick SMALL (*)    All other components within normal limits  CBC WITH DIFFERENTIAL/PLATELET   ____________________________________________  EKG  ED ECG  REPORT I, Chesley Noon, the attending physician, personally viewed and interpreted this ECG.   Date: 11/10/2018  EKG Time: 8:13  Rate: 60  Rhythm: normal sinus rhythm  Axis: Normal  Intervals:none  ST&T Change: None    PROCEDURES  Procedure(s) performed (including Critical Care):  Procedures   ____________________________________________   INITIAL IMPRESSION / ASSESSMENT AND PLAN / ED COURSE       58 year old female with history of BPPV presents to the ED with worsening episodes of feeling of the room is spinning around her with movement of her head over the past few days.  She has no focal neurologic deficits on exam, low suspicion of stroke or central etiology for vertigo.  EKG without acute changes, shows sinus rhythm.  Will screen labs, treat with IV fluids, Zofran, Valium.  Patient would benefit from referral to ENT for vertigo that has been unresponsive to treatment thus far.  Patient with some improvement in symptoms, labs are unremarkable.  She did have some bradycardia, however blood pressure remained stable with no lightheadedness or syncope.  Counseled to return to the ED for new or worsening symptoms, patient agrees with plan.      ____________________________________________   FINAL CLINICAL IMPRESSION(S) / ED  DIAGNOSES  Final diagnoses:  Benign paroxysmal positional vertigo, unspecified laterality  Nausea     ED Discharge Orders         Ordered    diazepam (VALIUM) 2 MG tablet  Every 12 hours PRN     11/10/18 0931           Note:  This document was prepared using Dragon voice recognition software and may include unintentional dictation errors.   Chesley Noon, MD 11/10/18 [phone]

## 2018-11-10 NOTE — ED Triage Notes (Signed)
Pt reports she got the flu shot Saturday and since then has had a headache, nausea and vertigo. Pt reports hx of vertigo but states this episode is worse.

## 2018-11-10 NOTE — ED Notes (Signed)
Pt. In sinus bradycardia rhythm, Charna Archer, MD notified

## 2018-11-10 NOTE — ED Notes (Signed)
Pt toileting at this time. Pt states she is able to stand steadily and safely travelled between toilet and bed.

## 2018-11-12 ENCOUNTER — Encounter: Payer: Self-pay | Admitting: Family Medicine

## 2018-11-12 ENCOUNTER — Telehealth: Payer: Self-pay

## 2018-11-12 DIAGNOSIS — R0602 Shortness of breath: Secondary | ICD-10-CM

## 2018-11-12 NOTE — Addendum Note (Signed)
Addended by: Lamar Blinks C on: 11/12/2018 03:18 PM   Modules accepted: Orders

## 2018-11-12 NOTE — Telephone Encounter (Signed)
Copied from [place] [id number]. Topic: Referral - Medical Records >> Nov 10, 2018 10:47 AM Rutherford Nail, NT wrote: Reason for CRM: [facility] with [facility] calling and states that the patient called their office to schedule an appointment. States that patient told her that she was supposed to have appointment pre-pandemic and was never able to go. Jasmine states that she is needing to know why the patient is needing the referral and last OV notes. Looking in Referrals tab, patient was referred to their office 12/12/2017. Please advise. Fax#: [place] [facility]#: [phone]

## 2018-11-12 NOTE — Telephone Encounter (Signed)
Looks like this referral was put in in 11/2017. She had a physical with you in November as well. The referral however has been closed a new one would be needed.

## 2018-11-15 DIAGNOSIS — Z1159 Encounter for screening for other viral diseases: Secondary | ICD-10-CM | POA: Diagnosis not present

## 2018-11-15 DIAGNOSIS — R42 Dizziness and giddiness: Secondary | ICD-10-CM | POA: Diagnosis not present

## 2018-11-15 DIAGNOSIS — R5383 Other fatigue: Secondary | ICD-10-CM | POA: Diagnosis not present

## 2018-11-25 ENCOUNTER — Ambulatory Visit: Payer: Self-pay | Admitting: Cardiology

## 2018-12-09 ENCOUNTER — Other Ambulatory Visit: Payer: Self-pay

## 2018-12-09 ENCOUNTER — Ambulatory Visit (INDEPENDENT_AMBULATORY_CARE_PROVIDER_SITE_OTHER): Payer: BLUE CROSS/BLUE SHIELD | Admitting: Cardiology

## 2018-12-09 ENCOUNTER — Encounter: Payer: Self-pay | Admitting: Cardiology

## 2018-12-09 ENCOUNTER — Ambulatory Visit: Payer: Self-pay | Admitting: Cardiology

## 2018-12-09 VITALS — BP 117/71 | HR 73 | Ht 66.0 in | Wt 201.0 lb

## 2018-12-09 DIAGNOSIS — R001 Bradycardia, unspecified: Secondary | ICD-10-CM | POA: Diagnosis not present

## 2018-12-09 DIAGNOSIS — K219 Gastro-esophageal reflux disease without esophagitis: Secondary | ICD-10-CM | POA: Diagnosis not present

## 2018-12-09 DIAGNOSIS — R5381 Other malaise: Secondary | ICD-10-CM | POA: Diagnosis not present

## 2018-12-09 DIAGNOSIS — R0602 Shortness of breath: Secondary | ICD-10-CM | POA: Diagnosis not present

## 2018-12-09 DIAGNOSIS — R5383 Other fatigue: Secondary | ICD-10-CM

## 2018-12-09 DIAGNOSIS — R42 Dizziness and giddiness: Secondary | ICD-10-CM | POA: Insufficient documentation

## 2018-12-09 NOTE — Progress Notes (Signed)
Primary Physician/Referring:  Pearline Cablesopland, Jessica C, MD  Patient ID: Tracy Larsen, female    DOB: 03/19/1960, 58 y.o.   MRN: [id number]  Chief Complaint  Patient presents with  . Shortness of Breath  . Mitral Valve Prolapse  . New Patient (Initial Visit)   HPI:    Tracy Larsen  is a 58 y.o. Patient referred to me for evaluation of bradycardia, worsening dyspnea and exertional past one to 2 years, previously several years ago having made diagnosis of reactive airway disease and seen Dr. Joni Fearslinton.  Patient states that over the past 2 years to 3 years she has noticed gradual worsening exercise capacity, worsening dyspnea on exertion and marked fatigue.  There are episodes of fatigue percent even after she wakes up in the morning and feels not tested.  No leg edema, no hemoptysis.  Denies chest pain.  Has occasional palpitations.  No PND or orthopnea.  No hemoptysis.  Has gradually gained some weight over time.  Past Medical History:  Diagnosis Date  . Allergy   . Arthritis    Patient denies.   . Asthma   . Endometriosis   . GERD (gastroesophageal reflux disease)   . Herpes   . HSV (herpes simplex virus) infection   . IBS (irritable bowel syndrome)   . Insomnia   . Mitral valve prolapse   . UTI (urinary tract infection)    chronic history r/t bladder reflux which was repaired,   Past Surgical History:  Procedure Laterality Date  . BLADDER REPAIR     AGE 12  . NASAL SINUS SURGERY    . PELVIC LAPAROSCOPY     X 5  . URETHRAL STRICTURE DILATATION     Social History   Socioeconomic History  . Marital status: Married    Spouse name: Not on file  . Number of children: 1  . Years of education: Not on file  . Highest education level: Not on file  Occupational History  . 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 Financial resource strain: Not on file  . Food insecurity    Worry: Not on file    Inability: Not on file  .  Transportation needs    Medical: Not on file    Non-medical: Not on file  Tobacco Use  . Smoking status: Former Smoker    Quit date: 01/27/2001    Years since quitting: 17.8  . Smokeless tobacco: Never Used  Substance and Sexual Activity  . Alcohol use: Yes    Alcohol/week: 1.0 standard drinks    Types: 1 Glasses of wine per week  . Drug use: No  . Sexual activity: Yes    Birth control/protection: None  Lifestyle  . Physical activity    Days per week: Not on file    Minutes per session: Not on file  . Stress: Not on file  Relationships  . Social Musicianconnections    Talks on phone: Not on file    Gets together: Not on file    Attends religious service: Not on file    Active member of club or organization: Not on file    Attends meetings of clubs or organizations: Not on file    Relationship status: Not on file  . Intimate partner violence    Fear of current or ex partner: Not on file    Emotionally abused: Not on file    Physically abused: Not on file  Forced sexual activity: Not on file  Other Topics Concern  . Not on file  Social History Narrative   Lives at home w/ husband and daughter and grandson   Right-handed   Drinks 2 glasses of tea daily   ROS  Review of Systems  Constitution: Negative for chills, decreased appetite, malaise/fatigue and weight gain.  Cardiovascular: Positive for dyspnea on exertion and palpitations. Negative for leg swelling and syncope.  Endocrine: Negative for cold intolerance.  Hematologic/Lymphatic: Does not bruise/bleed easily.  Musculoskeletal: Positive for back pain. Negative for joint swelling.  Gastrointestinal: Positive for change in bowel habit (IBS) and heartburn. Negative for abdominal pain, anorexia, hematochezia and melena.  Neurological: Positive for vertigo (chronic). Negative for headaches and light-headedness.  Psychiatric/Behavioral: Negative for depression and substance abuse.  All other systems reviewed and are negative.   Objective   Vitals with BMI 12/09/2018 11/10/2018 11/10/2018  Height 5\' 6"  - -  Weight 201 lbs - -  BMI 10.25 - -  Systolic 852 778 242  Diastolic 71 76 77  Pulse 73 54 49      Physical Exam  Constitutional:  She is moderately built and mildly obese in no acute distress.  HENT:  Head: Atraumatic.  Eyes: Conjunctivae are normal.  Neck: Neck supple. No JVD present. No thyromegaly present.  Cardiovascular: Normal rate, regular rhythm, normal heart sounds and intact distal pulses. Exam reveals no gallop.  No murmur heard. No leg edema, no JVD.  Pulmonary/Chest: Effort normal and breath sounds normal.  Abdominal: Soft. Bowel sounds are normal.  Musculoskeletal: Normal range of motion.  Neurological: She is alert.  Skin: Skin is warm and dry.  Psychiatric: She has a normal mood and affect.    Laboratory examination:    Recent Labs    02/19/18 1043 03/10/18 1120 11/10/18 0823  NA 139 142 140  K 4.6 4.7 4.4  CL 107 107 105  CO2 26 27 26   GLUCOSE 103* 100* 142*  BUN 15 12 15   CREATININE 0.92 0.96 0.90  CALCIUM 8.9 9.3 9.0  GFRNONAA >60 >60 >60  GFRAA >60 >60 >60   CrCl cannot be calculated (Patient's most recent lab result is older than the maximum 21 days allowed.).  CMP Latest Ref Rng & Units 11/10/2018 03/10/2018 02/19/2018  Glucose 70 - 99 mg/dL 142(H) 100(H) 103(H)  BUN 6 - 20 mg/dL 15 12 15   Creatinine 0.44 - 1.00 mg/dL 0.90 0.96 0.92  Sodium 135 - 145 mmol/L 140 142 139  Potassium 3.5 - 5.1 mmol/L 4.4 4.7 4.6  Chloride 98 - 111 mmol/L 105 107 107  CO2 22 - 32 mmol/L 26 27 26   Calcium 8.9 - 10.3 mg/dL 9.0 9.3 8.9  Total Protein 6.5 - 8.1 g/dL - 7.6 -  Total Bilirubin 0.3 - 1.2 mg/dL - 0.4 -  Alkaline Phos 38 - 126 U/L - 84 -  AST 15 - 41 U/L - 16 -  ALT 0 - 44 U/L - 12 -   CBC Latest Ref Rng & Units 11/10/2018 03/10/2018 02/19/2018  WBC 4.0 - 10.5 K/uL 7.0 6.1 7.0  Hemoglobin 12.0 - 15.0 g/dL 14.1 13.6 14.2  Hematocrit 36.0 - 46.0 % 43.1 44.2 44.1   Platelets 150 - 400 K/uL 214 264 243   Lipid Panel     Component Value Date/Time   CHOL 193 12/14/2017 0906   TRIG 123.0 12/14/2017 0906   HDL 61.70 12/14/2017 0906   CHOLHDL 3 12/14/2017 0906   VLDL 24.6 12/14/2017  1194   LDLCALC 107 (H) 12/14/2017 0906   HEMOGLOBIN A1C Lab Results  Component Value Date   HGBA1C 5.8 12/14/2017   TSH No results for input(s): TSH in the last 8760 hours. Medications and allergies   Allergies  Allergen Reactions  . Azithromycin Other (See Comments)    GI Issues/IBS Exacerbation  . Entex Other (See Comments)    Tongue peeling   . Erythromycin Diarrhea  . Promethazine Other (See Comments)    Made butt feel like it was on fire -- Suppository    . Stadol [Butorphanol] Other (See Comments)    dizziness  . Tamiflu [Oseltamivir Phosphate] Nausea And Vomiting  . Zofran [Ondansetron Hcl] Nausea Only and Other (See Comments)    Makes nausea much worse and dizziness   . Augmentin [Amoxicillin-Pot Clavulanate] Other (See Comments)    GI issues/IBS Exacerbation   . Ciprofloxacin Other (See Comments)    GI issues/IBS Exacerbation  . Levofloxacin Other (See Comments)    GI issues/IBS Exacerbation  . Moxifloxacin Hcl In Nacl Other (See Comments)    GI issues/IBS Exacerbation  . Sulfa Antibiotics Rash  . Tetracyclines & Related Rash     Current Outpatient Medications  Medication Instructions  . acetaminophen (TYLENOL) 650 mg, Oral, Every 6 hours PRN  . acyclovir (ZOVIRAX) 400 mg, Oral, 2 times daily, Uses as needed  . estradiol (ESTRACE) 1 mg, Oral, Daily  . medroxyPROGESTERone (PROVERA) 2.5 mg, Oral, Daily  . omeprazole (PRILOSEC) 40 MG capsule TAKE 1 CAPSULE BY MOUTH EVERY DAY  . Probiotic CAPS 1 capsule, Oral, Sometimes    Radiology:  No results found. Cardiac Studies:   Echocardiogram 08/24/2015:  - Left ventricle: The cavity size was normal. Wall thickness was   normal. Systolic function was normal. The estimated ejection   fraction  was in the range of 55% to 60%. Doppler parameters are   consistent with abnormal left ventricular relaxation (grade 1   diastolic dysfunction). - Mitral valve: There was mild regurgitation. - Left atrium: The atrium was mildly dilated.  Assessment     ICD-10-CM   1. SOB (shortness of breath)  R06.02 EKG 12-Lead    PCV ECHOCARDIOGRAM COMPLETE    PCV CARDIAC STRESS TEST    Pulse oximetry, overnight  2. Bradycardia on ECG  R00.1 PCV CARDIAC STRESS TEST  3. Gastroesophageal reflux disease without esophagitis  K21.9   4. Malaise and fatigue  R53.81    R53.83     EKG 12/09/2018: Normal sinus rhythm at rate of 70 bpm, leftward axis, incomplete right bundle branch block.  Poor R-wave progression, cannot exclude anteroseptal infarct old.  No evidence of ischemia, normal QT interval.   Recommendations:  No orders of the defined types were placed in this encounter.   Patient referred to me for evaluation of bradycardia, worsening dyspnea and exertional past one to 2 years, previously several years ago having made diagnosis of reactive airway disease and seen Dr. Joni Fears.  Her physical examination except for mild obesity is within normal limits.  I do not hear any murmur or any arterial bruit. I reviewed her labs, lipids mildly elevated, but agree she needs lifestyle modificatoin.  Her symptoms of dyspnea may be multifactorial including deconditioning, however, chronotropic incompetence due to underlying sinus bradycardia had to be excluded. Patient is not bradycardic today.   Noncardiac etiology for bradycardia at essentially excluded especially presence of systemic sclerosis. There are no clinical signs of the same or connective tissue disease.  Schedule for routine  treadmill stress test. Will schedule for an echocardiogram. Schedule nocturnal oxymetry. Office visit following the work-up/investigations.   If cardiac workup is negative, she needs  pulmonary workup.  Possibly consider sleep  study. If cardiopulmonary workup is negative, primary prevention with reassurance, weight loss and exercise would be most appropriate.  Yates Decamp, MD, [facility] 12/09/2018, 9:27 PM [facility] Pager: [phone] Office: [phone] If no answer Cell [phone]

## 2019-01-13 ENCOUNTER — Other Ambulatory Visit: Payer: Self-pay

## 2019-01-13 ENCOUNTER — Ambulatory Visit (INDEPENDENT_AMBULATORY_CARE_PROVIDER_SITE_OTHER): Payer: BLUE CROSS/BLUE SHIELD

## 2019-01-13 DIAGNOSIS — R0602 Shortness of breath: Secondary | ICD-10-CM | POA: Diagnosis not present

## 2019-01-14 DIAGNOSIS — R0989 Other specified symptoms and signs involving the circulatory and respiratory systems: Secondary | ICD-10-CM | POA: Diagnosis not present

## 2019-01-14 DIAGNOSIS — Z20828 Contact with and (suspected) exposure to other viral communicable diseases: Secondary | ICD-10-CM | POA: Diagnosis not present

## 2019-01-17 NOTE — Telephone Encounter (Signed)
Please read

## 2019-01-18 DIAGNOSIS — R0602 Shortness of breath: Secondary | ICD-10-CM | POA: Diagnosis not present

## 2019-01-19 ENCOUNTER — Ambulatory Visit
Admission: EM | Admit: 2019-01-19 | Discharge: 2019-01-19 | Disposition: A | Discharge status: Home / Self Care | Payer: BLUE CROSS/BLUE SHIELD | Attending: Emergency Medicine | Admitting: Emergency Medicine

## 2019-01-19 ENCOUNTER — Other Ambulatory Visit: Payer: Self-pay

## 2019-01-19 ENCOUNTER — Encounter: Payer: Self-pay | Admitting: Emergency Medicine

## 2019-01-19 DIAGNOSIS — R1031 Right lower quadrant pain: Secondary | ICD-10-CM | POA: Insufficient documentation

## 2019-01-19 DIAGNOSIS — R300 Dysuria: Secondary | ICD-10-CM | POA: Diagnosis not present

## 2019-01-19 LAB — POCT URINALYSIS DIP (MANUAL ENTRY)
Bilirubin, UA: NEGATIVE
Glucose, UA: NEGATIVE mg/dL
Ketones, POC UA: NEGATIVE mg/dL
Leukocytes, UA: NEGATIVE
Nitrite, UA: NEGATIVE
Protein Ur, POC: NEGATIVE mg/dL
Spec Grav, UA: 1.015 (ref 1.010–1.025)
Urobilinogen, UA: 0.2 E.U./dL
pH, UA: 7 (ref 5.0–8.0)

## 2019-01-19 NOTE — ED Provider Notes (Signed)
[name]    CSN: [id number] Arrival date & time: 01/19/19  0825      History   Chief Complaint Chief Complaint  Patient presents with  . Dysuria    HPI Tracy Larsen is a 58 y.o. female with history of endometriosis, IBS presenting for 2-day course of RLQ pain.  States that occasionally radiate to her low back on affected side.  States it was worse yesterday: Woke her from her sleep this morning around 2 AM.  Patient states since waking it has been intermittent.  Patient denies history of appendectomy, does endorse remote history of sepsis secondary UTI, bladder reflux.  Patient states she used to be followed by urology for this: States her urologist retired about 3 years ago and has not been seen by them since.  Patient does states she ate corn the other night which is different from her typical diet.  Denies diarrhea, nausea, vomiting, hematochezia, melena, hemorrhoids.   Past Medical History:  Diagnosis Date  . Allergy   . Arthritis    Patient denies.   . Asthma   . Endometriosis   . GERD (gastroesophageal reflux disease)   . Herpes   . HSV (herpes simplex virus) infection   . IBS (irritable bowel syndrome)   . Insomnia   . Mitral valve prolapse   . UTI (urinary tract infection)    chronic history r/t bladder reflux which was repaired,    Patient Active Problem List   Diagnosis Date Noted  . Vertigo 12/09/2018  . Transient visual disturbance, bilateral 07/02/2015  . IBS (irritable bowel syndrome) 04/22/2013  . Mitral valve prolapse 09/24/2012  . TIA (transient ischemic attack) 09/24/2012    Past Surgical History:  Procedure Laterality Date  . BLADDER REPAIR     AGE 18  . NASAL SINUS SURGERY    . PELVIC LAPAROSCOPY     X 5  . URETHRAL STRICTURE DILATATION      OB History    Gravida  1   Para  1   Term  1   Preterm      AB      Living  1     SAB      TAB      Ectopic      Multiple      Live Births                Home Medications    Prior to Admission medications   Medication Sig Start Date End Date Taking? Authorizing Provider  acetaminophen (TYLENOL) 325 MG tablet Take 650 mg by mouth every 6 (six) hours as needed.    [provider]  acyclovir (ZOVIRAX) 400 MG tablet Take 1 tablet (400 mg total) by mouth 2 (two) times daily. Uses as needed 12/14/17   Copland, Gwenlyn Found, MD  estradiol (ESTRACE) 1 MG tablet Take 1 mg by mouth daily.    [provider]  medroxyPROGESTERone (PROVERA) 2.5 MG tablet Take 2.5 mg by mouth daily.    [provider]  omeprazole (PRILOSEC) 40 MG capsule TAKE 1 CAPSULE BY MOUTH EVERY DAY 06/21/18   Copland, Gwenlyn Found, MD  Probiotic CAPS Take 1 capsule by mouth. Sometimes    [provider]    Family History Family History  Problem Relation Age of Onset  . Cancer Mother        VULVAR CANCER  . Heart disease Father        MI in his 69s  .  Cancer Brother        SARCOMA  . Heart disease Brother        WPW  . Diabetes Paternal Grandmother   . Colon cancer Neg Hx     Social History Social History   Tobacco Use  . Smoking status: Former Smoker    Quit date: 01/27/2001    Years since quitting: 17.9  . Smokeless tobacco: Never Used  Substance Use Topics  . Alcohol use: Yes    Alcohol/week: 1.0 standard drinks    Types: 1 Glasses of wine per week  . Drug use: No     Allergies   Azithromycin, Entex, Erythromycin, Promethazine, Stadol [butorphanol], Tamiflu [oseltamivir phosphate], Zofran [ondansetron hcl], Augmentin [amoxicillin-pot clavulanate], Ciprofloxacin, Levofloxacin, Moxifloxacin hcl in nacl, Sulfa antibiotics, and Tetracyclines & related   Review of Systems Review of Systems  Constitutional: Negative for activity change, appetite change, fatigue and fever.  HENT: Negative for ear pain, sinus pain, sore throat and voice change.   Eyes: Negative for pain, redness and visual disturbance.  Respiratory: Negative for  cough and shortness of breath.   Cardiovascular: Negative for chest pain and palpitations.  Gastrointestinal: Positive for abdominal pain. Negative for abdominal distention, blood in stool, constipation, diarrhea, nausea, rectal pain and vomiting.  Genitourinary: Negative for decreased urine volume, dysuria, frequency, pelvic pain, urgency, vaginal bleeding, vaginal discharge and vaginal pain.  Musculoskeletal: Negative for arthralgias and myalgias.  Skin: Negative for rash and wound.  Neurological: Negative for syncope and headaches.     Physical Exam Triage Vital Signs ED Triage Vitals  Enc Vitals Group     BP 01/19/19 0836 137/73     Pulse Rate 01/19/19 0836 69     Resp 01/19/19 0836 18     Temp 01/19/19 0836 97.9 F (36.6 C)     Temp Source 01/19/19 0836 Temporal     SpO2 01/19/19 0836 97 %     Weight --      Height --      Head Circumference --      Peak Flow --      Pain Score 01/19/19 0839 5     Pain Loc --      Pain Edu? --      Excl. in GC? --    No data found.  Updated Vital Signs BP 137/73 (BP Location: Left Arm)   Pulse 69   Temp 97.9 F (36.6 C) (Temporal)   Resp 18   LMP 10/15/2011   SpO2 97%   Visual Acuity Right Eye Distance:   Left Eye Distance:   Bilateral Distance:    Right Eye Near:   Left Eye Near:    Bilateral Near:     Physical Exam Constitutional:      General: She is not in acute distress.    Appearance: She is normal weight. She is not toxic-appearing.  HENT:     Head: Normocephalic and atraumatic.     Mouth/Throat:     Mouth: Mucous membranes are moist.     Pharynx: Oropharynx is clear.  Eyes:     General: No scleral icterus.    Conjunctiva/sclera: Conjunctivae normal.     Pupils: Pupils are equal, round, and reactive to light.  Cardiovascular:     Rate and Rhythm: Normal rate and regular rhythm.  Pulmonary:     Effort: Pulmonary effort is normal. No respiratory distress.     Breath sounds: No wheezing.  Abdominal:      General: Abdomen  is flat. Bowel sounds are normal. There is no distension.     Palpations: Abdomen is soft.     Tenderness: There is abdominal tenderness. There is no right CVA tenderness, left CVA tenderness or guarding.     Comments: Mild RLQ TTP. Negative Murphy's, McBurney's, Rovsing signs.  Musculoskeletal:        General: Normal range of motion.     Right lower leg: No edema.     Left lower leg: No edema.  Skin:    Coloration: Skin is not jaundiced or pale.     Findings: No bruising or rash.  Neurological:     General: No focal deficit present.     Mental Status: She is alert and oriented to person, place, and time.      UC Treatments / Results  Labs (all labs ordered are listed, but only abnormal results are displayed) Labs Reviewed  POCT URINALYSIS DIP (MANUAL ENTRY) - Abnormal; Notable for the following components:      Result Value   Blood, UA trace-lysed (*)    All other components within normal limits  URINE CULTURE    EKG   Radiology No results found.  Procedures Procedures (including critical care time)  Medications Ordered in UC Medications - No data to display  Initial Impression / Assessment and Plan / UC Course  I have reviewed the triage vital signs and the nursing notes.  Pertinent labs & imaging results that were available during my care of the patient were reviewed by me and considered in my medical decision making (see chart for details).     POC urine dipstick done in office, reviewed by me: Showing trace-lysed RBC which patient states is chronic for her.  Patient denies gross hematuria.  Given patient's history of sepsis second to UTI, bladder reflux: This provider is agreeable to patient's request to culture her urine.  Will withhold antibiotics until culture results.  Low concern for acute appendicitis at this time.  Patient to practice supportive measures as outlined below, monitor symptoms.  Return precautions discussed, patient verbalized  understanding and is agreeable to plan. Final Clinical Impressions(s) / UC Diagnoses   Final diagnoses:  Dysuria  Acute right lower quadrant pain     Discharge Instructions     Treat symptoms supportively as discussed at appointment: Ibuprofen, Tylenol, heating pad. Monitor for fever, worsening abdominal pain as this may mean that you need to go to the ER for further evaluation. May try ileocecal massage, colonic massage, bowel rest for additional relief. Important follow-up with your PCP, keep track of symptoms in the interim. May need referral to urology for further evaluation/management of chronic hematuria. Urine culture pending: We will call you if any further treatment is needed.    ED Prescriptions    None     PDMP not reviewed this encounter.   Hall-Potvin, [place], [place] 01/19/19 1503

## 2019-01-19 NOTE — ED Triage Notes (Signed)
Pt presents to [facility] for 2 days of RLQ abdominal pain with radiation to her back, which she states is normal with her UTIs.  Denies burning with urination.  States hx of sepsis with UTIs.  Also c/o sinus issues, which she has already been tested for COVID for.

## 2019-01-19 NOTE — Discharge Instructions (Addendum)
Treat symptoms supportively as discussed at appointment: Ibuprofen, Tylenol, heating pad. Monitor for fever, worsening abdominal pain as this may mean that you need to go to the ER for further evaluation. May try ileocecal massage, colonic massage, bowel rest for additional relief. Important follow-up with your PCP, keep track of symptoms in the interim. May need referral to urology for further evaluation/management of chronic hematuria. Urine culture pending: We will call you if any further treatment is needed.

## 2019-01-19 NOTE — ED Notes (Signed)
Patient able to ambulate independently  

## 2019-01-20 LAB — URINE CULTURE: Culture: NO GROWTH

## 2019-01-31 ENCOUNTER — Ambulatory Visit: Payer: BLUE CROSS/BLUE SHIELD | Admitting: Cardiology

## 2019-02-18 DIAGNOSIS — R042 Hemoptysis: Secondary | ICD-10-CM | POA: Diagnosis not present

## 2019-02-18 DIAGNOSIS — J019 Acute sinusitis, unspecified: Secondary | ICD-10-CM | POA: Diagnosis not present

## 2019-02-18 DIAGNOSIS — R0989 Other specified symptoms and signs involving the circulatory and respiratory systems: Secondary | ICD-10-CM | POA: Diagnosis not present

## 2019-02-18 DIAGNOSIS — J209 Acute bronchitis, unspecified: Secondary | ICD-10-CM | POA: Diagnosis not present

## 2019-02-18 DIAGNOSIS — Z20828 Contact with and (suspected) exposure to other viral communicable diseases: Secondary | ICD-10-CM | POA: Diagnosis not present

## 2019-02-20 ENCOUNTER — Encounter: Payer: Self-pay | Admitting: Family Medicine

## 2019-02-21 ENCOUNTER — Encounter: Payer: Self-pay | Admitting: Family Medicine

## 2019-02-21 DIAGNOSIS — J40 Bronchitis, not specified as acute or chronic: Secondary | ICD-10-CM | POA: Diagnosis not present

## 2019-02-21 DIAGNOSIS — R0600 Dyspnea, unspecified: Secondary | ICD-10-CM | POA: Diagnosis not present

## 2019-02-21 DIAGNOSIS — Z03818 Encounter for observation for suspected exposure to other biological agents ruled out: Secondary | ICD-10-CM | POA: Diagnosis not present

## 2019-02-23 ENCOUNTER — Encounter: Payer: Self-pay | Admitting: Emergency Medicine

## 2019-02-23 ENCOUNTER — Ambulatory Visit: Payer: BC Managed Care – PPO | Admitting: Pulmonary Disease

## 2019-02-23 ENCOUNTER — Telehealth: Payer: Self-pay | Admitting: Pulmonary Disease

## 2019-02-23 ENCOUNTER — Other Ambulatory Visit: Payer: Self-pay

## 2019-02-23 ENCOUNTER — Emergency Department
Admission: EM | Admit: 2019-02-23 | Discharge: 2019-02-23 | Disposition: A | Discharge status: Home / Self Care | Payer: BC Managed Care – PPO | Attending: Emergency Medicine | Admitting: Emergency Medicine

## 2019-02-23 ENCOUNTER — Emergency Department: Payer: BC Managed Care – PPO

## 2019-02-23 ENCOUNTER — Encounter: Payer: Self-pay | Admitting: Pulmonary Disease

## 2019-02-23 VITALS — BP 114/70 | HR 78 | Temp 97.7°F | Ht 66.5 in | Wt 205.0 lb

## 2019-02-23 DIAGNOSIS — Z79899 Other long term (current) drug therapy: Secondary | ICD-10-CM | POA: Diagnosis not present

## 2019-02-23 DIAGNOSIS — J45909 Unspecified asthma, uncomplicated: Secondary | ICD-10-CM | POA: Insufficient documentation

## 2019-02-23 DIAGNOSIS — R001 Bradycardia, unspecified: Secondary | ICD-10-CM | POA: Diagnosis not present

## 2019-02-23 DIAGNOSIS — Z20822 Contact with and (suspected) exposure to covid-19: Secondary | ICD-10-CM | POA: Diagnosis not present

## 2019-02-23 DIAGNOSIS — R0602 Shortness of breath: Secondary | ICD-10-CM | POA: Diagnosis not present

## 2019-02-23 DIAGNOSIS — Z8673 Personal history of transient ischemic attack (TIA), and cerebral infarction without residual deficits: Secondary | ICD-10-CM | POA: Diagnosis not present

## 2019-02-23 DIAGNOSIS — Z87891 Personal history of nicotine dependence: Secondary | ICD-10-CM | POA: Insufficient documentation

## 2019-02-23 DIAGNOSIS — R0600 Dyspnea, unspecified: Secondary | ICD-10-CM | POA: Diagnosis not present

## 2019-02-23 LAB — COMPREHENSIVE METABOLIC PANEL
ALT: 10 U/L (ref 0–44)
AST: 11 U/L — ABNORMAL LOW (ref 15–41)
Albumin: 3.6 g/dL (ref 3.5–5.0)
Alkaline Phosphatase: 64 U/L (ref 38–126)
Anion gap: 7 (ref 5–15)
BUN: 19 mg/dL (ref 6–20)
CO2: 26 mmol/L (ref 22–32)
Calcium: 8.6 mg/dL — ABNORMAL LOW (ref 8.9–10.3)
Chloride: 109 mmol/L (ref 98–111)
Creatinine, Ser: 1.04 mg/dL — ABNORMAL HIGH (ref 0.44–1.00)
GFR calc Af Amer: >60 mL/min (ref >60)
GFR calc non Af Amer: 60 mL/min — ABNORMAL LOW (ref >60)
Glucose, Bld: 77 mg/dL (ref 70–99)
Potassium: 3.6 mmol/L (ref 3.5–5.1)
Sodium: 142 mmol/L (ref 135–145)
Total Bilirubin: 0.5 mg/dL (ref 0.3–1.2)
Total Protein: 7 g/dL (ref 6.5–8.1)

## 2019-02-23 LAB — CBC WITH DIFFERENTIAL/PLATELET
Abs Immature Granulocytes: 0.05 10*3/uL (ref 0.00–0.07)
Basophils Absolute: 0 10*3/uL (ref 0.0–0.1)
Basophils Relative: 0 %
Eosinophils Absolute: 0 10*3/uL (ref 0.0–0.5)
Eosinophils Relative: 0 %
HCT: 39.7 % (ref 36.0–46.0)
Hemoglobin: 12.8 g/dL (ref 12.0–15.0)
Immature Granulocytes: 1 %
Lymphocytes Relative: 37 %
Lymphs Abs: 3.5 10*3/uL (ref 0.7–4.0)
MCH: 29.4 pg (ref 26.0–34.0)
MCHC: 32.2 g/dL (ref 30.0–36.0)
MCV: 91.3 fL (ref 80.0–100.0)
Monocytes Absolute: 0.7 10*3/uL (ref 0.1–1.0)
Monocytes Relative: 8 %
Neutro Abs: 5.1 10*3/uL (ref 1.7–7.7)
Neutrophils Relative %: 54 %
Platelets: 228 10*3/uL (ref 150–400)
RBC: 4.35 MIL/uL (ref 3.87–5.11)
RDW: 12 % (ref 11.5–15.5)
WBC: 9.5 10*3/uL (ref 4.0–10.5)
nRBC: 0 % (ref 0.0–0.2)

## 2019-02-23 LAB — URINALYSIS, COMPLETE (UACMP) WITH MICROSCOPIC
Bacteria, UA: NONE SEEN
Bilirubin Urine: NEGATIVE
Glucose, UA: NEGATIVE mg/dL
Hgb urine dipstick: NEGATIVE
Ketones, ur: NEGATIVE mg/dL
Leukocytes,Ua: NEGATIVE
Nitrite: NEGATIVE
Protein, ur: NEGATIVE mg/dL
Specific Gravity, Urine: 1.004 — ABNORMAL LOW (ref 1.005–1.030)
pH: 6 (ref 5.0–8.0)

## 2019-02-23 LAB — RESPIRATORY PANEL BY RT PCR (FLU A&B, COVID)
Influenza A by PCR: NEGATIVE
Influenza B by PCR: NEGATIVE
SARS Coronavirus 2 by RT PCR: NEGATIVE

## 2019-02-23 LAB — FIBRIN DERIVATIVES D-DIMER (ARMC ONLY): Fibrin derivatives D-dimer (ARMC): 349.58 ng/mL (FEU) (ref 0.00–499.00)

## 2019-02-23 LAB — LIPASE, BLOOD: Lipase: 21 U/L (ref 11–51)

## 2019-02-23 LAB — TROPONIN I (HIGH SENSITIVITY)
Troponin I (High Sensitivity): <2 ng/L (ref <18)
Troponin I (High Sensitivity): <2 ng/L (ref <18)

## 2019-02-23 LAB — LACTIC ACID, PLASMA: Lactic Acid, Venous: 1 mmol/L (ref 0.5–1.9)

## 2019-02-23 MED ORDER — BREO ELLIPTA 200-25 MCG/INH IN AEPB: 1.0000 | INHALATION_SPRAY | Freq: Every day | RESPIRATORY_TRACT | 3 refills | Status: DC

## 2019-02-23 MED ORDER — LEVALBUTEROL TARTRATE 45 MCG/ACT IN AERO: 1.0000 | INHALATION_SPRAY | RESPIRATORY_TRACT | 0 refills | Status: DC | PRN

## 2019-02-23 MED ORDER — BREO ELLIPTA 200-25 MCG/INH IN AEPB: 1.0000 | INHALATION_SPRAY | Freq: Every day | RESPIRATORY_TRACT | 0 refills | Status: DC

## 2019-02-23 NOTE — Progress Notes (Signed)
Tracy Larsen    [id number]    1960/10/25  Primary Care Physician:Copland, Gwenlyn Found, MD  Referring Physician: Shaune Pollack, MD [address] [place],  [place] [address]  Chief complaint: Consult for dyspnea  HPI: 59 year old with past medical history of asthma, GERD, mitral valve prolapse, irritable bowel syndrome  Previously evaluated by Dr. Maple Hudson in 2004 for asthma With longstanding asthma, worsened by bleach, heat, cold, humidity.  Symptoms are worse in after the Covid pandemic due to mask wiring and exposure to cleaning supplies, bleach. Evaluated at [facility] clinic, on 1/22 for bronchitis given cefdinir and prednisone.  Evaluated on 1/25 for ongoing symptoms and prescribed Spiriva.  She has not tolerated the Spiriva well with increasing chest tightness, pain She was evaluated in the ED today 1/27 for the same with negative D-dimer, troponin, negative COVID-19 test, negative lactic acid.  Prescribe Xopenex nebs and referred to pulmonary  Also sees Dr. Jacinto Halim, cardiology for sinus bradycardia.  Treadmill stress test and nocturnal oximetry, echocardiogram were unremarkable.  She also has irritable bowel syndrome and follows with Dr. Gerilyn Pilgrim She is on Protonix for GERD.  Pets: No pets Occupation: Ophthalmology technician Exposures: Has dampness at home.  Cannot tell if there is mold.  No hot tubs, Jacuzzi's.  She got feather pillows recently but her symptoms preceded this Smoking history: 5-pack-year smoker.  Quit in 2000 Travel history: Originally from [place].  No significant recent travel Relevant family history: No significant family history of lung disease  Outpatient Encounter Medications as of 02/23/2019  Medication Sig  . acetaminophen (TYLENOL) 325 MG tablet Take 650 mg by mouth every 6 (six) hours as needed.  Marland Kitchen acyclovir (ZOVIRAX) 400 MG tablet Take 1 tablet (400 mg total) by mouth 2 (two) times daily. Uses as needed  . cefdinir (OMNICEF) 300 MG  capsule Take 300 mg by mouth 2 (two) times daily.   Marland Kitchen estradiol (ESTRACE) 1 MG tablet Take 1 mg by mouth daily.  . medroxyPROGESTERone (PROVERA) 2.5 MG tablet Take 2.5 mg by mouth daily.  . montelukast (SINGULAIR) 10 MG tablet Take 10 mg by mouth at bedtime.   Marland Kitchen omeprazole (PRILOSEC) 40 MG capsule TAKE 1 CAPSULE BY MOUTH EVERY DAY  . Probiotic CAPS Take 1 capsule by mouth. Sometimes  . [DISCONTINUED] benzonatate (TESSALON) 200 MG capsule Take 200 mg by mouth 3 (three) times daily as needed.   . [DISCONTINUED] ipratropium (ATROVENT HFA) 17 MCG/ACT inhaler Inhale into the lungs.  . [DISCONTINUED] levalbuterol (XOPENEX HFA) 45 MCG/ACT inhaler Inhale 1 puff into the lungs every 4 (four) hours as needed for wheezing.   No facility-administered encounter medications on file as of 02/23/2019.    Allergies as of 02/23/2019 - Review Complete 02/23/2019  Allergen Reaction Noted  . Azithromycin Other (See Comments) 02/25/2016  . Entex Other (See Comments) 07/24/2010  . Erythromycin Diarrhea 07/24/2010  . Promethazine Other (See Comments) 09/28/2013  . Stadol [butorphanol] Other (See Comments) 01/25/2016  . Tamiflu [oseltamivir phosphate] Nausea And Vomiting 02/21/2016  . Zofran [ondansetron hcl] Nausea Only and Other (See Comments) 09/28/2013  . Augmentin [amoxicillin-pot clavulanate] Other (See Comments) 12/15/2012  . Ciprofloxacin Other (See Comments) 09/03/2016  . Levofloxacin Other (See Comments) 07/24/2010  . Moxifloxacin hcl in nacl Other (See Comments) 03/11/2011  . Sulfa antibiotics Rash 06/03/2010  . Tetracyclines & related Rash 06/03/2010    Past Medical History:  Diagnosis Date  . Allergy   . Arthritis    Patient  denies.   . Asthma   . Endometriosis   . GERD (gastroesophageal reflux disease)   . Herpes   . HSV (herpes simplex virus) infection   . IBS (irritable bowel syndrome)   . Insomnia   . Mitral valve prolapse   . UTI (urinary tract infection)    chronic history r/t  bladder reflux which was repaired,    Past Surgical History:  Procedure Laterality Date  . BLADDER REPAIR     AGE 44  . NASAL SINUS SURGERY    . PELVIC LAPAROSCOPY     X 5  . URETHRAL STRICTURE DILATATION      Family History  Problem Relation Age of Onset  . Cancer Mother        VULVAR CANCER  . Heart disease Father        MI in his 66s  . Cancer Brother        SARCOMA  . Heart disease Brother        WPW  . Diabetes Paternal Grandmother   . Colon cancer Neg Hx     Social History   Socioeconomic History  . Marital status: Married    Spouse name: Not on file  . Number of children: 1  . Years of education: Not on file  . Highest education level: Not on file  Occupational History  . 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 Smoking status: Former Smoker    Quit date: 01/27/2001    Years since quitting: 18.0  . Smokeless tobacco: Never Used  Substance and Sexual Activity  . Alcohol use: Yes    Alcohol/week: 1.0 standard drinks    Types: 1 Glasses of wine per week  . Drug use: No  . Sexual activity: Yes    Birth control/protection: None  Other Topics Concern  . Not on file  Social History Narrative   Lives at home w/ husband and daughter and grandson   Right-handed   Drinks 2 glasses of tea daily   Social Determinants of Health   Financial Resource Strain:   . Difficulty of Paying Living Expenses: Not on file  Food Insecurity:   . Worried About Programme researcher, broadcasting/film/video in the Last Year: Not on file  . Ran Out of Food in the Last Year: Not on file  Transportation Needs:   . Lack of Transportation (Medical): Not on file  . Lack of Transportation (Non-Medical): Not on file  Physical Activity:   . Days of Exercise per Week: Not on file  . Minutes of Exercise per Session: Not on file  Stress:   . Feeling of Stress : Not on file  Social Connections:   . Frequency of Communication with Friends and Family: Not on file  .  Frequency of Social Gatherings with Friends and Family: Not on file  . Attends Religious Services: Not on file  . Active Member of Clubs or Organizations: Not on file  . Attends Banker Meetings: Not on file  . Marital Status: Not on file  Intimate Partner Violence:   . Fear of Current or Ex-Partner: Not on file  . Emotionally Abused: Not on file  . Physically Abused: Not on file  . Sexually Abused: Not on file    Review of systems: Review of Systems  Constitutional: Negative for fever and chills.  HENT: Negative.   Eyes: Negative for blurred vision.  Respiratory: as per HPI  Cardiovascular: Negative for  chest pain and palpitations.  Gastrointestinal: Negative for vomiting, diarrhea, blood per rectum. Genitourinary: Negative for dysuria, urgency, frequency and hematuria.  Musculoskeletal: Negative for myalgias, back pain and joint pain.  Skin: Negative for itching and rash.  Neurological: Negative for dizziness, tremors, focal weakness, seizures and loss of consciousness.  Endo/Heme/Allergies: Negative for environmental allergies.  Psychiatric/Behavioral: Negative for depression, suicidal ideas and hallucinations.  All other systems reviewed and are negative.  Physical Exam: Blood pressure 114/70, pulse 78, temperature 97.7 F (36.5 C), temperature source Temporal, height 5' 6.5" (1.689 m), weight 205 lb (93 kg), last menstrual period 10/15/2011, SpO2 100 %. Gen:      No acute distress HEENT:  EOMI, sclera anicteric Neck:     No masses; no thyromegaly Lungs:    Clear to auscultation bilaterally; normal respiratory effort CV:         Regular rate and rhythm; no murmurs Abd:      + bowel sounds; soft, non-tender; no palpable masses, no distension Ext:    No edema; adequate peripheral perfusion Skin:      Warm and dry; no rash Neuro: alert and oriented x 3 Psych: normal mood and affect  Data Reviewed: Imaging: Chest x-ray 02/23/2019-mild airway thickening.  No  acute infiltrate CT abdomen pelvis 03/10/2018-visualized lung bases are normal. I have reviewed the images personally  Labs: CBC 11/10/2018-WBC 7, eos 4%, absolute eosinophil count 240  Overnight oximetry 01/15/2019-no significant desats  Echocardiogram 01/13/2019-normal LV function.  LVEF 63%, mild mitral regurgitation.  Normal right atrial pressure.  Assessment:  Evaluation for dyspnea She has history of asthma and may be having exacerbation with exposure to cleaning products, mask wearing during COVID pandemic.  We will stop the Spiriva as it is not appropriate treatment for asthma Try Breo 200 inhaler, Xopenex rescue inhaler CBC today shows no peripheral eosinophils but she is on prednisone with 1 more day left We will check respiratory allergy profile.  Schedule pulmonary function test  Suspect component of deconditioning, body habitus which is contributing to symptoms.  She does have bradycardia and possibly a component of chronotropic incompetence. If above work-up is negative then we will do a cardiopulmonary exercise test  GERD May be contributing to symptoms Increase Prilosec to twice daily.  Plan/Recommendations: - Respiratory allergy profile, PFTs - Breo 200, Xopenex - Increase Prilosec to 40 mg twice daily  This appointment required 60 minutes of patient care (this includes precharting, chart review, review of results, face-to-face care, etc.).  Marshell Garfinkel MD [facility] 02/23/2019, 11:19 AM  CC: Duffy Bruce, MD

## 2019-02-23 NOTE — ED Provider Notes (Signed)
[facility] Emergency Department Provider Note   ____________________________________________   First MD Initiated Contact with Patient 02/23/19 [phone]     (approximate)  I have reviewed the triage vital signs and the nursing notes.   HISTORY  Chief Complaint Shortness of Breath    HPI Tracy Larsen is a 59 y.o. female who presents to the ED from home with a chief complaint of shortness of breath.  Patient has a history of chemical induced asthma diagnosed 20 years ago.  Has been experiencing shortness of breath for the past 1 to 2 years.  States she was in the midst of a cardiac work-up in December 2020 when her work-up was suspended due to the coronavirus pandemic.  She was referred to pulmonology but again her work-up was deferred due to the pandemic.  States she works for [employer] and a small area surrounded by chemicals and bleach which irritate her sinuses and lungs even through her mask.  She was seen at [facility] clinic on 1/22 with complaints of sinus pressure, chest tightness, shortness of breath and hemoptysis.  Had oral Covid swab which was negative.  Started on prednisone and cefdinir.  Had a follow-up on 1/25 and swabbed again for Covid which was negative.  Started on Singulair and Spiriva.  Has noted chest tightness since starting Spiriva.  Denies fever, abdominal pain, vomiting or diarrhea.  Endorses occasional nausea.  Denies recent travel or trauma.  Takes estrogen.  Concerned that she is bradycardic.  States the last time she was bradycardic 2 years ago she was hospitalized for sepsis secondary to UTI.       Past Medical History:  Diagnosis Date  . Allergy   . Arthritis    Patient denies.   . Asthma   . Endometriosis   . GERD (gastroesophageal reflux disease)   . Herpes   . HSV (herpes simplex virus) infection   . IBS (irritable bowel syndrome)   . Insomnia   . Mitral valve prolapse   . UTI (urinary tract infection)    chronic history r/t  bladder reflux which was repaired,  Bradycardia  Patient Active Problem List   Diagnosis Date Noted  . Vertigo 12/09/2018  . Transient visual disturbance, bilateral 07/02/2015  . IBS (irritable bowel syndrome) 04/22/2013  . Mitral valve prolapse 09/24/2012  . TIA (transient ischemic attack) 09/24/2012    Past Surgical History:  Procedure Laterality Date  . BLADDER REPAIR     AGE 43  . NASAL SINUS SURGERY    . PELVIC LAPAROSCOPY     X 5  . URETHRAL STRICTURE DILATATION      Prior to Admission medications   Medication Sig Start Date End Date Taking? Authorizing Provider  acetaminophen (TYLENOL) 325 MG tablet Take 650 mg by mouth every 6 (six) hours as needed.    [provider]  acyclovir (ZOVIRAX) 400 MG tablet Take 1 tablet (400 mg total) by mouth 2 (two) times daily. Uses as needed 12/14/17   Copland, Gwenlyn Found, MD  estradiol (ESTRACE) 1 MG tablet Take 1 mg by mouth daily.    [provider]  medroxyPROGESTERone (PROVERA) 2.5 MG tablet Take 2.5 mg by mouth daily.    [provider]  omeprazole (PRILOSEC) 40 MG capsule TAKE 1 CAPSULE BY MOUTH EVERY DAY 06/21/18   Copland, Gwenlyn Found, MD  Probiotic CAPS Take 1 capsule by mouth. Sometimes    [provider]    Allergies Azithromycin, Entex, Erythromycin, Promethazine, Stadol [butorphanol],  Tamiflu [oseltamivir phosphate], Zofran [ondansetron hcl], Augmentin [amoxicillin-pot clavulanate], Ciprofloxacin, Levofloxacin, Moxifloxacin hcl in nacl, Sulfa antibiotics, and Tetracyclines & related  Family History  Problem Relation Age of Onset  . Cancer Mother        VULVAR CANCER  . Heart disease Father        MI in his 26s  . Cancer Brother        SARCOMA  . Heart disease Brother        WPW  . Diabetes Paternal Grandmother   . Colon cancer Neg Hx     Social History Social History   Tobacco Use  . Smoking status: Former Smoker    Quit date: 01/27/2001    Years since quitting: 18.0  .  Smokeless tobacco: Never Used  Substance Use Topics  . Alcohol use: Yes    Alcohol/week: 1.0 standard drinks    Types: 1 Glasses of wine per week  . Drug use: No    Review of Systems  Constitutional: Positive for fatigue.  No fever/chills Eyes: No visual changes. ENT: No sore throat. Cardiovascular: Positive for chest tightness. Respiratory: Positive for shortness of breath. Gastrointestinal: No abdominal pain.  No nausea, no vomiting.  No diarrhea.  No constipation. Genitourinary: Negative for dysuria. Musculoskeletal: Negative for back pain. Skin: Negative for rash. Neurological: Negative for headaches, focal weakness or numbness.   ____________________________________________   PHYSICAL EXAM:  VITAL SIGNS: ED Triage Vitals  Enc Vitals Group     BP 02/23/19 0530 (!) 102/50     Pulse Rate 02/23/19 0530 69     Resp 02/23/19 0530 18     Temp 02/23/19 0530 98.2 F (36.8 C)     Temp Source 02/23/19 0530 Oral     SpO2 02/23/19 0530 98 %     Weight 02/23/19 0529 200 lb (90.7 kg)     Height 02/23/19 0529 5\' 6"  (1.676 m)     Head Circumference --      Peak Flow --      Pain Score 02/23/19 0528 0     Pain Loc --      Pain Edu? --      Excl. in GC? --     Constitutional: Alert and oriented.  Anxious appearing and in mild acute distress. Eyes: Conjunctivae are normal. PERRL. EOMI. Head: Atraumatic. Nose: No congestion/rhinnorhea. Mouth/Throat: Mucous membranes are moist.   Neck: No stridor.   Cardiovascular: Normal rate, regular rhythm. Grossly normal heart sounds.  Good peripheral circulation. Respiratory: Normal respiratory effort.  No retractions. Lungs slightly diminished bibasilarly. Gastrointestinal: Soft and nontender. No distention. No abdominal bruits. No CVA tenderness. Musculoskeletal: No lower extremity tenderness nor edema.  No joint effusions. Neurologic:  Normal speech and language. No gross focal neurologic deficits are appreciated.  Skin:  Skin is  warm, dry and intact. No rash noted. Psychiatric: Mood and affect are normal. Speech and behavior are normal.  ____________________________________________   LABS (all labs ordered are listed, but only abnormal results are displayed)  Labs Reviewed  SARS CORONAVIRUS 2 (TAT 6-24 HRS)  RESPIRATORY PANEL BY RT PCR (FLU A&B, COVID)  CBC WITH DIFFERENTIAL/PLATELET  COMPREHENSIVE METABOLIC PANEL  FIBRIN DERIVATIVES D-DIMER ([facility] ONLY)  LIPASE, BLOOD  URINALYSIS, COMPLETE (UACMP) WITH MICROSCOPIC  LACTIC ACID, PLASMA  LACTIC ACID, PLASMA  TROPONIN I (HIGH SENSITIVITY)   ____________________________________________  EKG  ED ECG REPORT I, Islah Eve J, the attending physician, personally viewed and interpreted this ECG.   Date: 02/23/2019  EKG Time: 0620  Rate: 49  Rhythm: sinus bradycardia  Axis: Normal  Intervals:none  ST&T Change: Nonspecific  ____________________________________________  RADIOLOGY  ED MD interpretation: Mild airway thickening; no opacities  Official radiology report(s): DG Chest 2 View  Result Date: 02/23/2019 CLINICAL DATA:  Shortness of breath, history of asthma with recent use of Spiriva EXAM: CHEST - 2 VIEW COMPARISON:  Radiograph 02/19/2018 FINDINGS: Some bandlike opacities in the periphery of the lung bases likely reflect subsegmental atelectasis. No consolidative airspace disease is seen. Minimal airways thickening compatible with reported history of asthma. No pneumothorax. No effusion. The cardiomediastinal contours are unremarkable. No acute osseous or soft tissue abnormality. IMPRESSION: Mild airways thickening compatible with reported history of asthma. No consolidative airspace disease. Some bandlike opacities in the lung bases likely reflect subsegmental atelectasis. Electronically Signed   By: Kreg Shropshire M.D.   On: 02/23/2019 06:17    ____________________________________________   PROCEDURES  Procedure(s) performed (including Critical  Care):  Procedures   ____________________________________________   INITIAL IMPRESSION / ASSESSMENT AND PLAN / ED COURSE  As part of my medical decision making, I reviewed the following data within the electronic MEDICAL RECORD NUMBER Nursing notes reviewed and incorporated, Labs reviewed, EKG interpreted, Old chart reviewed, Radiograph reviewed and Notes from prior ED visits     ELIOT BENCIVENGA was evaluated in Emergency Department on 02/23/2019 for the symptoms described in the history of present illness. She was evaluated in the context of the global COVID-19 pandemic, which necessitated consideration that the patient might be at risk for infection with the SARS-CoV-2 virus that causes COVID-19. Institutional protocols and algorithms that pertain to the evaluation of patients at risk for COVID-19 are in a state of rapid change based on information released by regulatory bodies including the CDC and federal and state organizations. These policies and algorithms were followed during the patient's care in the ED.    59 year old female presenting with shortness of breath. Differential includes, but is not limited to, viral syndrome, bronchitis including COPD exacerbation, pneumonia, reactive airway disease including asthma, CHF including exacerbation with or without pulmonary/interstitial edema, pneumothorax, ACS, thoracic trauma, and pulmonary embolism.  Will obtain cardiac work-up, check D-dimer, chest x-ray.  Patient agreeable to have nasal Covid PCR swab done.  Will ambulate with pulse oximeter.  I personally reviewed patient's records and see that she was evaluated for bradycardia by cardiology in November 2020.  She states she was supposed to have a stress test which was suspended due to the COVID-19 pandemic.   Clinical Course as of Feb 23 656  Wed Feb 23, 2019  8242 Patient maintained room air saturations of 100% while ambulating.   [JS]  [id number] Clarified with patient; records indicate  that she was originally prescribed Atrovent on her last visit to the clinic.  Due to insurance issues, this was switched to Singulair.  Updated patient on chest x-ray and CBC which are the only results back thus far.  Will check orthostatic vital signs, lactic acid, urinalysis given patient's concerns of bradycardia which she relates to her hospitalization for sepsis 2 years ago.  Care transferred to Dr. Erma Heritage at this time at change of shift.  Pending rest of lab work including D-dimer, repeat troponin.   [JS]    Clinical Course User Index [JS] Irean Hong, MD     ____________________________________________   FINAL CLINICAL IMPRESSION(S) / ED DIAGNOSES  Final diagnoses:  Shortness of breath     ED Discharge Orders    None  Note:  This document was prepared using Dragon voice recognition software and may include unintentional dictation errors.   Paulette Blanch, MD 02/23/19 [phone]

## 2019-02-23 NOTE — ED Notes (Signed)
Pt is alert and oriented. Unlabored at this time with no dyspnea noted.  NAD. VSS.

## 2019-02-23 NOTE — Discharge Instructions (Addendum)
As we discussed, I have placed an urgent referral to pulmonology to help assist with arranging follow-up.  I have also prescribed Xopenex inhaler.  This often is not covered by insurance.  The nebulizer, however, is covered.  If you would like to try the nebulizer formula, let the pharmacy know and we can call this in.  Otherwise, I would check good Rx for possible coupons of Xopenex inhaler.

## 2019-02-23 NOTE — ED Notes (Signed)
o2 levels stayed at 100% while ambulating  On room air

## 2019-02-23 NOTE — ED Triage Notes (Signed)
Patient ambulatory to triage with steady gait, without difficulty or distress noted, mask in place; pt reports awoke with [facility], rx spiriva on Monday for her asthma and has felt "heaviness" in her chest since starting

## 2019-02-23 NOTE — Patient Instructions (Addendum)
We will check a respiratory allergy profile Schedule pulmonary function test We will start you on Breo 200 inhaler Continue the Xopenex inhaler.  Finish the prednisone taper Stop Spiriva Increase Prilosec to 40 mg twice daily for 1 to 2 weeks for  Follow-up in 1 to 2 months.

## 2019-02-23 NOTE — Telephone Encounter (Signed)
Called and spoke with pt who stated the breo was going to be $140 and due to this, she is not going to be able to afford it. Pt wants to know if there is a different inhaler that he could be put on in place of the breo due to cost or if we have any options.  Pt said she has tried both spiriva and advair in the past. Dr. Isaiah Serge, please advise on this for pt.

## 2019-02-23 NOTE — ED Provider Notes (Signed)
Assumed care from Dr. Dolores Frame at 7 AM. Briefly, the patient is a 59 y.o. female with PMHx of  has a past medical history of Allergy, Arthritis, Asthma, Endometriosis, GERD (gastroesophageal reflux disease), Herpes, HSV (herpes simplex virus) infection, IBS (irritable bowel syndrome), Insomnia, Mitral valve prolapse, and UTI (urinary tract infection). here with shortness of breath.  Pt has a complex history of asthma, reported h/o sepsis from UTIs. She was recently treated for sinusitis and started on Spiriva, which she believes has worsened her SOB. She also notes that she has been mildly bradycardic per her report - 49-79 - which she has reportedly had when "septic" before. No fevers. No overt CP. EKG is non-ischemic. Initial labs reviewed and are unremarkable. Plan to f/u labs, likely d/c if negative.   Labs Reviewed  SARS CORONAVIRUS 2 (TAT 6-24 HRS)  RESPIRATORY PANEL BY RT PCR (FLU A&B, COVID)  CBC WITH DIFFERENTIAL/PLATELET  COMPREHENSIVE METABOLIC PANEL  FIBRIN DERIVATIVES D-DIMER ([facility] ONLY)  LIPASE, BLOOD  URINALYSIS, COMPLETE (UACMP) WITH MICROSCOPIC  LACTIC ACID, PLASMA  LACTIC ACID, PLASMA  TROPONIN I (HIGH SENSITIVITY)    Course of Care: -Labs reassuring. D-Dimer neg. Trop neg x 2. COVID test negative. Will d/c with outpt Pulm referral. She already has Cards f/u arranged. No apparent emergent pathology. Discussed treatment options - given her reaction to steroid and albuterol inh in past, offered Xopenex to trial PRN.     Shaune Pollack, MD 02/23/19 [phone]

## 2019-02-24 LAB — RESPIRATORY ALLERGY PROFILE REGION II ~~LOC~~

## 2019-02-24 LAB — INTERPRETATION:

## 2019-02-24 NOTE — Telephone Encounter (Signed)
Findings of benefits investigation via test claims at [facility]:  Insurance: Scientist, research (life sciences) Diskus - # 1 inhaler for a 1 month supply through [employer] is $ 15.00.  Advair HFA - # 1 inhaler for a 1 month supply through patient's insurance is $ 150.00.  Symicort - # 1 inhaler for a 1 month supply through patient's insurance is $ 150.00.  Dulera - # 1 inhaler for a 1 month supply through patient's insurance is $ 150.00.- Copay card available- pays up to $90 per fill.  Breo - # 1 inhaler for a 1 month supply through patient's insurance is $ 150.00.  Airduo- is not covered by the plan.  *Patient has a $100 deductible applying to the higher tiered medications. Copay will be $50 for those medications once deductible has been met.  2:55 PM Beatriz Chancellor, CPhT

## 2019-02-24 NOTE — Telephone Encounter (Signed)
Called patient on 02/24/2019 at 3:08 PM and left HIPAA-compliant VM with instructions to call [facility] clinic back   Plan to discuss cost of ICS/LABA inhaler options with patient based on test claims, Ronald Pippins, CPT, ran.   Thank you for involving pharmacy to assist in providing this patient's care.   Zachery Conch, PharmD PGY2 Ambulatory Care Pharmacy Resident

## 2019-02-24 NOTE — Telephone Encounter (Signed)
Will cc pharmacy team to see which LABA/ICS is cheapest for her.

## 2019-02-25 ENCOUNTER — Telehealth: Payer: Self-pay | Admitting: Pharmacist

## 2019-02-25 ENCOUNTER — Telehealth: Payer: Self-pay | Admitting: Pulmonary Disease

## 2019-02-25 DIAGNOSIS — R0600 Dyspnea, unspecified: Secondary | ICD-10-CM

## 2019-02-25 MED ORDER — BUDESONIDE-FORMOTEROL FUMARATE 160-4.5 MCG/ACT IN AERO: 2.0000 | INHALATION_SPRAY | Freq: Two times a day (BID) | RESPIRATORY_TRACT | 1 refills | Status: DC

## 2019-02-25 MED ORDER — BREZTRI AEROSPHERE 160-9-4.8 MCG/ACT IN AERO: 2.0000 | INHALATION_SPRAY | Freq: Two times a day (BID) | RESPIRATORY_TRACT | 11 refills | Status: DC

## 2019-02-25 NOTE — Telephone Encounter (Signed)
Dr. Isaiah Serge, I added Virgel Bouquet to her list of allergies.  However, when I entered Symbicort the system flagged it for allergies due to "fluticasone-furoate-vilanterol" - hives.  Sorry, any other suggestions?

## 2019-02-25 NOTE — Telephone Encounter (Signed)
Symbicort 160 bid

## 2019-02-25 NOTE — Telephone Encounter (Signed)
Ok to hold breo Try Advair diskus 500/50 twice daily. It is cheaper through her insurance and she has been on it before without any problem.

## 2019-02-25 NOTE — Telephone Encounter (Signed)
Dr. Isaiah Serge,   I called the patient back and told her of the suggestion to take Advair and she declined it. She stated that it did not work.  Alternatives?

## 2019-02-25 NOTE — Telephone Encounter (Signed)
Pt returning call.  [phone]

## 2019-02-25 NOTE — Telephone Encounter (Signed)
Dr. Isaiah Serge,  This patient had a visit with you on 02/23/19 for Dyspnea, and issued Breo at that time.  I called the patient back and she stated she took the second dose of Breo yesterday morning between 9:30 - 10:00. The hives did not start until last night between 8:00 and 9:00.  The patient confirmed she did use Benadryl 25 mg and had to put hydrocortisone cream on her back in order to sleep. But the hives are still there this morning.  Patient stated the Breo did help her breathing and was excited because of the improvement because she felt like she could breathe for the first time. Has been using Singulair for a few days, but does not feel the Singulair caused the hives.  I told the patient to not use the Breo today until a response has been received from you, please advise of recommendations. Thank you.

## 2019-02-25 NOTE — Addendum Note (Signed)
Addended by: Buena Irish on: 02/25/2019 01:45 PM   Modules accepted: Orders

## 2019-02-25 NOTE — Telephone Encounter (Signed)
It is going to give that warning with any inhaler since breo was entered as a allergy.   Options are limited at this point.  It would be reasonable to try if patient is willing Tell her to wait for a week until the hives have resolved before trying another inhaler

## 2019-02-25 NOTE — Telephone Encounter (Signed)
Called the patient back and she stated she thinks it may have been the soap she has been using for the last 2 days.   Patient asked for the Symbicort to be sent to the pharmacy just in case. She will use the Breo again and stop using the soap and see if the hives come back, if they do not she knows it was the soap and will continue using the Breo.  I asked the patient to contact our office Monday by phone or mychart message to let us know, because the list of allergies and medication list would have to be updated.  Patient voiced understanding. Nothing further needed.

## 2019-02-25 NOTE — Telephone Encounter (Signed)
Called patient on 02/25/2019 at 11:02 AM   Patient states she had an allergic reaction 2 days ago (experienced hives on her back - self treated with hydrocortisone cream and diphenhydramine which has resolved). She recently initiated Breo, Singulair, and took a bath with a new soap. It is unclear the exact underlying cause of allergic reaction.  Patient's insurance requires $100 deductible prior to coverage of higher tiered medication. Therefore, Symbicort and Dulera are $150 for 30 day supply (will be $50 for 30 day supply after deductible is met). We do not have samples of Symbicort and Dulera currently. It is $15 for Advair Diskus however patient is not willing to re-initiate Advair Diskus considering she has tried medication in the past and did not experience any benefit.   Prefer patient to have sample of next inhaler prescribed to ensure she does not experience allergic reaction. After discussion with patient's pulmonologist, Dr. Vaughan Browner, it may be more appropriate to escalate therapy to [facility] since we currently have samples of Breztri. Called patient to discuss this and she is agreeable to starting Breztri.   Patient's insurance has e-voucher applying to [facility] therefore sample will cost $40 for 30-day supply. Also, for future note Trelegy will cost $150 for 30-day supply (if she applies for copay card it can take off $100 and bring copay down to $50 per 30-day supply). Provided patient with sample at front entrance (husband will pick up). Judithann Sauger LOT # is [id number] and expiration date 08/26/2020. Sample will last 1 week. Will also send in rx if she tolerates Breztri.  Follow up in 1 week to re-assess patient and determine if she is tolerating medication.   Thank you for involving pharmacy to assist in providing this patient's care.   Drexel Iha, PharmD PGY2 Ambulatory Care Pharmacy Resident

## 2019-02-28 NOTE — Telephone Encounter (Signed)
I am glad to hear that the itching and rash is getting better.  Agree that Singulair may be the culprit here.  For now will hold all medications including inhalers We can rechallenge with inhaler either Breo or try Breztri after the rash is completely gone to see if it will recur off Singulair.

## 2019-02-28 NOTE — Telephone Encounter (Signed)
Dr. Isaiah Serge please advise on patient email:   Good morning. I just wanted to let you know the rash and Hives are coming from the Singulair. I stopped inhaler to see since Pharmacist and your office did not think it was the [facility]. I used the Singulair 2 more times .I have stopped everything  Other than Hives and itching I have been better yesterday and today. No problem with face tongue or swelling . I'm taking Benedryl for the itch. My voice was getting hoarse and I guess that can come from inhaler ? They gave me a sample of the Brezti inhaler but for now I think I need to wait until all the rash is gone before restarting anything . If there is something other than Singilair you can let me know . Thank you again for working me in the other day.  Candise Bowens

## 2019-03-02 NOTE — Telephone Encounter (Signed)
Velvet Bathe, CMA to Elayne Guerin H       11:51 AM Cleone Slim Rosey Bath! I've copied Dr. Shirlee More recommendations below.  Please let us know if you have any additional questions.   Thanks! [facility]   ------------- I am glad to hear that the itching and rash is getting better.  Agree that Singulair may be the culprit here.   For now will hold all medications including inhalers We can rechallenge with inhaler either Breo or try Breztri after the rash is completely gone to see if it will recur off Singulair.

## 2019-04-01 ENCOUNTER — Institutional Professional Consult (permissible substitution): Payer: BLUE CROSS/BLUE SHIELD | Admitting: Internal Medicine

## 2019-04-13 DIAGNOSIS — D225 Melanocytic nevi of trunk: Secondary | ICD-10-CM | POA: Diagnosis not present

## 2019-04-13 DIAGNOSIS — Z86018 Personal history of other benign neoplasm: Secondary | ICD-10-CM | POA: Diagnosis not present

## 2019-04-13 DIAGNOSIS — L570 Actinic keratosis: Secondary | ICD-10-CM | POA: Diagnosis not present

## 2019-04-13 DIAGNOSIS — D2270 Melanocytic nevi of unspecified lower limb, including hip: Secondary | ICD-10-CM | POA: Diagnosis not present

## 2019-04-13 DIAGNOSIS — D492 Neoplasm of unspecified behavior of bone, soft tissue, and skin: Secondary | ICD-10-CM | POA: Diagnosis not present

## 2019-04-13 DIAGNOSIS — C44712 Basal cell carcinoma of skin of right lower limb, including hip: Secondary | ICD-10-CM | POA: Diagnosis not present

## 2019-04-13 DIAGNOSIS — C44519 Basal cell carcinoma of skin of other part of trunk: Secondary | ICD-10-CM | POA: Diagnosis not present

## 2019-04-19 DIAGNOSIS — C44519 Basal cell carcinoma of skin of other part of trunk: Secondary | ICD-10-CM | POA: Diagnosis not present

## 2019-04-19 DIAGNOSIS — T148XXA Other injury of unspecified body region, initial encounter: Secondary | ICD-10-CM | POA: Diagnosis not present

## 2019-05-18 ENCOUNTER — Telehealth: Payer: Self-pay | Admitting: Pulmonary Disease

## 2019-05-18 NOTE — Telephone Encounter (Signed)
Dr. Isaiah Serge please advise on allergy test results.

## 2019-05-20 NOTE — Telephone Encounter (Signed)
Left message for patient to call back  

## 2019-05-20 NOTE — Telephone Encounter (Signed)
ATC patient per DPR left detailed message regarding lab result.  Nothing further needed at this time.

## 2019-05-20 NOTE — Telephone Encounter (Signed)
Pt returning call - needs to know results of labs - PLEASE leave a detailed message on phone [phone] (per DPR) as she works at [employer] with the same hours as [place].

## 2019-05-20 NOTE — Telephone Encounter (Signed)
Sorry we did not call her with results Labs are normal with no allergies noted on the test.

## 2019-05-26 DIAGNOSIS — C44519 Basal cell carcinoma of skin of other part of trunk: Secondary | ICD-10-CM | POA: Diagnosis not present

## 2019-05-26 DIAGNOSIS — Z85828 Personal history of other malignant neoplasm of skin: Secondary | ICD-10-CM | POA: Diagnosis not present

## 2019-05-26 DIAGNOSIS — C44712 Basal cell carcinoma of skin of right lower limb, including hip: Secondary | ICD-10-CM | POA: Diagnosis not present

## 2019-06-16 ENCOUNTER — Ambulatory Visit: Payer: BC Managed Care – PPO | Admitting: Family Medicine

## 2019-06-25 ENCOUNTER — Emergency Department: Payer: BC Managed Care – PPO

## 2019-06-25 ENCOUNTER — Emergency Department
Admission: EM | Admit: 2019-06-25 | Discharge: 2019-06-25 | Disposition: A | Discharge status: Home / Self Care | Payer: BC Managed Care – PPO | Attending: Emergency Medicine | Admitting: Emergency Medicine

## 2019-06-25 ENCOUNTER — Other Ambulatory Visit: Payer: Self-pay

## 2019-06-25 DIAGNOSIS — Z87891 Personal history of nicotine dependence: Secondary | ICD-10-CM | POA: Insufficient documentation

## 2019-06-25 DIAGNOSIS — R197 Diarrhea, unspecified: Secondary | ICD-10-CM

## 2019-06-25 DIAGNOSIS — R1031 Right lower quadrant pain: Secondary | ICD-10-CM

## 2019-06-25 DIAGNOSIS — M5431 Sciatica, right side: Secondary | ICD-10-CM | POA: Diagnosis not present

## 2019-06-25 DIAGNOSIS — Z79899 Other long term (current) drug therapy: Secondary | ICD-10-CM | POA: Insufficient documentation

## 2019-06-25 DIAGNOSIS — Z8673 Personal history of transient ischemic attack (TIA), and cerebral infarction without residual deficits: Secondary | ICD-10-CM | POA: Insufficient documentation

## 2019-06-25 DIAGNOSIS — R102 Pelvic and perineal pain: Secondary | ICD-10-CM | POA: Diagnosis not present

## 2019-06-25 DIAGNOSIS — M5441 Lumbago with sciatica, right side: Secondary | ICD-10-CM | POA: Diagnosis not present

## 2019-06-25 LAB — COMPREHENSIVE METABOLIC PANEL
ALT: 11 U/L (ref 0–44)
AST: 16 U/L (ref 15–41)
Albumin: 3.9 g/dL (ref 3.5–5.0)
Alkaline Phosphatase: 87 U/L (ref 38–126)
Anion gap: 5 (ref 5–15)
BUN: 13 mg/dL (ref 6–20)
CO2: 29 mmol/L (ref 22–32)
Calcium: 8.8 mg/dL — ABNORMAL LOW (ref 8.9–10.3)
Chloride: 108 mmol/L (ref 98–111)
Creatinine, Ser: 1.06 mg/dL — ABNORMAL HIGH (ref 0.44–1.00)
GFR calc Af Amer: >60 mL/min (ref >60)
GFR calc non Af Amer: 58 mL/min — ABNORMAL LOW (ref >60)
Glucose, Bld: 77 mg/dL (ref 70–99)
Potassium: 4.4 mmol/L (ref 3.5–5.1)
Sodium: 142 mmol/L (ref 135–145)
Total Bilirubin: 0.4 mg/dL (ref 0.3–1.2)
Total Protein: 7.3 g/dL (ref 6.5–8.1)

## 2019-06-25 LAB — URINALYSIS, COMPLETE (UACMP) WITH MICROSCOPIC
Bacteria, UA: NONE SEEN
Bilirubin Urine: NEGATIVE
Glucose, UA: NEGATIVE mg/dL
Ketones, ur: NEGATIVE mg/dL
Leukocytes,Ua: NEGATIVE
Nitrite: NEGATIVE
Protein, ur: NEGATIVE mg/dL
Specific Gravity, Urine: 1.017 (ref 1.005–1.030)
pH: 5 (ref 5.0–8.0)

## 2019-06-25 LAB — CBC
HCT: 40.5 % (ref 36.0–46.0)
Hemoglobin: 13.2 g/dL (ref 12.0–15.0)
MCH: 29.5 pg (ref 26.0–34.0)
MCHC: 32.6 g/dL (ref 30.0–36.0)
MCV: 90.4 fL (ref 80.0–100.0)
Platelets: 232 10*3/uL (ref 150–400)
RBC: 4.48 MIL/uL (ref 3.87–5.11)
RDW: 12.1 % (ref 11.5–15.5)
WBC: 5.4 10*3/uL (ref 4.0–10.5)
nRBC: 0 % (ref 0.0–0.2)

## 2019-06-25 LAB — LIPASE, BLOOD: Lipase: 22 U/L (ref 11–51)

## 2019-06-25 MED ORDER — SODIUM CHLORIDE 0.9 % IV BOLUS
1000.0000 mL | Freq: Once | INTRAVENOUS | Status: AC
  Administered 2019-06-25: 1000 mL via INTRAVENOUS

## 2019-06-25 MED ORDER — PREDNISONE 10 MG (21) PO TBPK: ORAL_TABLET | ORAL | 0 refills | Status: DC

## 2019-06-25 MED ORDER — METOCLOPRAMIDE HCL 5 MG/ML IJ SOLN
10.0000 mg | Freq: Once | INTRAMUSCULAR | Status: AC
  Administered 2019-06-25: 10 mg via INTRAVENOUS
  Filled 2019-06-25: qty 2

## 2019-06-25 MED ORDER — IOHEXOL 9 MG/ML PO SOLN
1000.0000 mL | Freq: Once | ORAL | Status: DC | PRN
  Administered 2019-06-25: 1000 mL via ORAL
  Filled 2019-06-25 (×2): qty 1000

## 2019-06-25 MED ORDER — IOHEXOL 300 MG/ML  SOLN
100.0000 mL | Freq: Once | INTRAMUSCULAR | Status: AC | PRN
  Administered 2019-06-25: 100 mL via INTRAVENOUS
  Filled 2019-06-25: qty 100

## 2019-06-25 MED ORDER — SODIUM CHLORIDE 0.9% FLUSH: 3.0000 mL | Freq: Once | INTRAVENOUS | Status: DC

## 2019-06-25 MED ORDER — HYDROCODONE-ACETAMINOPHEN 5-325 MG PO TABS: 1.0000 | ORAL_TABLET | Freq: Four times a day (QID) | ORAL | 0 refills | Status: AC | PRN

## 2019-06-25 NOTE — ED Triage Notes (Signed)
Pt c/o right lower back pain that radiates around to the RLQ pain since last Saturday with watery diarrhea with a hx of colitis.

## 2019-06-25 NOTE — ED Provider Notes (Signed)
[facility] Emergency Department Provider Note ____________________________________________   First MD Initiated Contact with Patient 06/25/19 1259     (approximate)  I have reviewed the triage vital signs and the nursing notes.   HISTORY  Chief Complaint Abdominal Pain  HPI Tracy Larsen is a 59 y.o. female who presents to the emergency department for treatment and evaluation of right lower pelvic pain that radiates into her right lower back as well as episodes of watery diarrhea.  She also states that she has chronic bloating after eating which seems to be worse.  She states that she feels very "gassy."  Symptoms started 1 week ago.  Upon awakening she noticed that she had pain in her right buttock which she felt to be sciatica.  She scheduled an appointment for a massage and allow the masseuse to perform a more deep tissue massage than usual.  She states that the next morning the pain in her back and pelvic area was more intense.  She was taking ibuprofen for her pain.  She states that ibuprofen typically causes a flare of her colitis.  She states that she stopped taking the ibuprofen after she began to feel the cramping pain in her pelvis and had an episode of watery diarrhea that was "as if taking a colon prep for colonoscopy."  She has not noticed any blood.  She had a second episode of this watery diarrhea this morning after breakfast.  She currently feels nauseated and still has pain in the right lower quadrant that radiates into her right lower back.  No fever.  No vomiting.        Past Medical History:  Diagnosis Date   Allergy    Arthritis    Patient denies.    Asthma    Endometriosis    GERD (gastroesophageal reflux disease)    Herpes    HSV (herpes simplex virus) infection    IBS (irritable bowel syndrome)    Insomnia    Mitral valve prolapse    UTI (urinary tract infection)    chronic history r/t bladder reflux which was  repaired,    Patient Active Problem List   Diagnosis Date Noted   Vertigo 12/09/2018   Transient visual disturbance, bilateral 07/02/2015   IBS (irritable bowel syndrome) 04/22/2013   Mitral valve prolapse 09/24/2012   TIA (transient ischemic attack) 09/24/2012    Past Surgical History:  Procedure Laterality Date   BLADDER REPAIR     AGE 69   NASAL SINUS SURGERY     PELVIC LAPAROSCOPY     X 5   URETHRAL STRICTURE DILATATION      Prior to Admission medications   Medication Sig Start Date End Date Taking? Authorizing Provider  acetaminophen (TYLENOL) 325 MG tablet Take 650 mg by mouth every 6 (six) hours as needed.    [provider]  acyclovir (ZOVIRAX) 400 MG tablet Take 1 tablet (400 mg total) by mouth 2 (two) times daily. Uses as needed 12/14/17   Copland, Gwenlyn Found, MD  Budeson-Glycopyrrol-Formoterol (BREZTRI AEROSPHERE) 160-9-4.8 MCG/ACT AERO Inhale 2 puffs into the lungs 2 (two) times daily. 02/25/19   Mannam, Colbert Coyer, MD  budesonide-formoterol (SYMBICORT) 160-4.5 MCG/ACT inhaler Inhale 2 puffs into the lungs 2 (two) times daily. 02/25/19   Mannam, Colbert Coyer, MD  estradiol (ESTRACE) 1 MG tablet Take 1 mg by mouth daily.    [provider]  HYDROcodone-acetaminophen (NORCO/VICODIN) 5-325 MG tablet Take 1 tablet by mouth every 6 (six) hours as  needed for up to 3 days for severe pain. 06/25/19 06/28/19  Alyssandra Hulsebus, Rulon Eisenmenger B, FNP  medroxyPROGESTERone (PROVERA) 2.5 MG tablet Take 2.5 mg by mouth daily.    [provider]  montelukast (SINGULAIR) 10 MG tablet Take 10 mg by mouth at bedtime.  02/21/19 02/21/20  [provider]  omeprazole (PRILOSEC) 40 MG capsule TAKE 1 CAPSULE BY MOUTH EVERY DAY 06/21/18   Copland, Gwenlyn Found, MD  predniSONE (STERAPRED UNI-PAK 21 TAB) 10 MG (21) TBPK tablet Take 6 tablets on the first day and decrease by 1 tablet each day until finished. 06/25/19   Taisha Pennebaker, Rulon Eisenmenger B, FNP  Probiotic CAPS Take 1 capsule by mouth. Sometimes     [provider]    Allergies Azithromycin, Breo ellipta [fluticasone furoate-vilanterol], Entex, Erythromycin, Promethazine, Stadol [butorphanol], Tamiflu [oseltamivir phosphate], Zofran [ondansetron hcl], Augmentin [amoxicillin-pot clavulanate], Ciprofloxacin, Levofloxacin, Moxifloxacin hcl in nacl, Sulfa antibiotics, and Tetracyclines & related  Family History  Problem Relation Age of Onset   Cancer Mother        VULVAR CANCER   Heart disease Father        MI in his 57s   Cancer Brother        SARCOMA   Heart disease Brother        WPW   Diabetes Paternal Grandmother    Colon cancer Neg Hx     Social History Social History   Tobacco Use   Smoking status: Former Smoker    Quit date: 01/27/2001    Years since quitting: 18.4   Smokeless tobacco: Never Used  Substance Use Topics   Alcohol use: Yes    Alcohol/week: 1.0 standard drinks    Types: 1 Glasses of wine per week   Drug use: No    Review of Systems  Constitutional: No fever/chills Eyes: No visual changes. ENT: No sore throat. Cardiovascular: Denies chest pain. Respiratory: Denies shortness of breath. Gastrointestinal: No abdominal pain.  No nausea, no vomiting.  Positive for diarrhea.  No constipation. Genitourinary: Negative for dysuria. Musculoskeletal: Positive for back pain. Skin: Negative for rash. Neurological: Negative for headaches, focal weakness or numbness. ____________________________________________   PHYSICAL EXAM:  VITAL SIGNS: ED Triage Vitals  Enc Vitals Group     BP 06/25/19 1205 115/71     Pulse Rate 06/25/19 1205 80     Resp 06/25/19 1205 18     Temp 06/25/19 1205 97.9 F (36.6 C)     Temp Source 06/25/19 1205 Oral     SpO2 06/25/19 1205 100 %     Weight 06/25/19 1206 200 lb (90.7 kg)     Height 06/25/19 1206 5\' 6"  (1.676 m)     Head Circumference --      Peak Flow --      Pain Score 06/25/19 1206 6     Pain Loc --      Pain Edu? --      Excl. in GC? --      Constitutional: Alert and oriented. Well appearing and in no acute distress. Eyes: Conjunctivae are normal. PERRL. EOMI. Head: Atraumatic. Nose: No congestion/rhinnorhea. Mouth/Throat: Mucous membranes are moist.  Oropharynx non-erythematous. Neck: No stridor.   Hematological/Lymphatic/Immunilogical: No cervical lymphadenopathy. Cardiovascular: Normal rate, regular rhythm. Grossly normal heart sounds.  Good peripheral circulation. Respiratory: Normal respiratory effort.  No retractions. Lungs CTAB. Gastrointestinal: Soft.  Right lower quadrant tenderness.  No distention. No abdominal bruits. No CVA tenderness. Genitourinary:  Musculoskeletal: No lower extremity tenderness nor edema.  No joint  effusions. Neurologic:  Normal speech and language. No gross focal neurologic deficits are appreciated. No gait instability. Skin:  Skin is warm, dry and intact. No rash noted. Psychiatric: Mood and affect are normal. Speech and behavior are normal.  ____________________________________________   LABS (all labs ordered are listed, but only abnormal results are displayed)  Labs Reviewed  COMPREHENSIVE METABOLIC PANEL - Abnormal; Notable for the following components:      Result Value   Creatinine, Ser 1.06 (*)    Calcium 8.8 (*)    GFR calc non Af Amer 58 (*)    All other components within normal limits  URINALYSIS, COMPLETE (UACMP) WITH MICROSCOPIC - Abnormal; Notable for the following components:   Color, Urine YELLOW (*)    APPearance CLEAR (*)    Hgb urine dipstick SMALL (*)    All other components within normal limits  LIPASE, BLOOD  CBC   ____________________________________________  EKG   ____________________________________________  RADIOLOGY  ED MD interpretation:    CT T is overall reassuring.  There is a lipomatous infiltration in the ileocecal valve, otherwise no indication of acute infectious process.  There is diverticulosis without diverticulitis.  Appendix is  normal.  Official radiology report(s): CT ABDOMEN PELVIS W CONTRAST  Result Date: 06/25/2019 CLINICAL DATA:  Right lower quadrant pain EXAM: CT ABDOMEN AND PELVIS WITH CONTRAST TECHNIQUE: Multidetector CT imaging of the abdomen and pelvis was performed using the standard protocol following bolus administration of intravenous contrast. CONTRAST:  12mL OMNIPAQUE IOHEXOL 300 MG/ML  SOLN COMPARISON:  March 10, 2018 FINDINGS: Lower chest: There is slight bibasilar atelectasis. No lung base edema or airspace opacity. Hepatobiliary: There are scattered small cysts throughout the liver, stable. No new liver lesions evident. Gallbladder wall is not appreciably thickened. There is no biliary duct dilatation. Pancreas: There is no pancreatic mass or inflammatory focus. Spleen: No splenic lesions are evident. Adrenals/Urinary Tract: Adrenals bilaterally appear normal. There is a cyst in the posterior aspect of the lower pole of the right kidney measuring 1.2 x 1.2 cm. There is scarring along the upper pole of the right kidney, stable from previous study. No evident hydronephrosis on either side. No renal or ureteral calculus on either side. Urinary bladder is midline with wall thickness within normal limits. Stomach/Bowel: There are sigmoid diverticula without diverticulitis. There is no appreciable bowel wall or mesenteric thickening. There is no evident bowel obstruction. Terminal ileum appears normal. There is no evident free air or portal venous air. Note that there is lipomatous infiltration of the ileocecal valve. Vascular/Lymphatic: No abdominal aortic aneurysm. No arterial vascular lesions are evident. Major venous structures appear patent. No evident adenopathy in the abdomen or pelvis. Reproductive: Uterus is anteverted.  No evident pelvic mass. Other: The appendix appears normal. There is no evident abscess or ascites in the abdomen or pelvis. There is a small umbilical hernia containing only fat.  Musculoskeletal: No blastic or lytic bone lesions. No intramuscular lesions evident. IMPRESSION: 1. No bowel wall thickening or bowel obstruction. Note that there is lipomatous infiltration in the ileocecal valve. This finding has been associated with intermittent right lower quadrant pain. There are sigmoid diverticula without diverticulitis evident. 2.  Appendix appears normal.  No abscess in the abdomen or pelvis. 3. Scarring upper right kidney, stable. No hydronephrosis on either side. No renal or ureteral calculus on either side. Urinary bladder wall thickness normal. 4.  Small umbilical hernia containing only fat. Electronically Signed   By: Lowella Grip III M.D.  On: 06/25/2019 15:30    ____________________________________________   PROCEDURES  Procedure(s) performed (including Critical Care):  Procedures  ____________________________________________   INITIAL IMPRESSION / ASSESSMENT AND PLAN     59 year old female presenting to the emergency department for treatment and evaluation of right lower abdominal pain and back pain with episodes of watery diarrhea.  See HPI for further details.  Plan will be to review protocol labs drawn while in triage and order CT of the abdomen and pelvis.  Patient is aware and agrees to the plan.  DIFFERENTIAL DIAGNOSIS  Colitis, appendicitis, diverticulitis, diverticulosis, gastroenteritis  ED COURSE  CT of the abdomen and pelvis is overall reassuring.  There is no clear explanation for the patient's symptoms however the ____________________________________________   FINAL CLINICAL IMPRESSION(S) / ED DIAGNOSES  Final diagnoses:  Acute right lower quadrant pain  Diarrhea, unspecified type  Sciatica of right side     ED Discharge Orders         Ordered    HYDROcodone-acetaminophen (NORCO/VICODIN) 5-325 MG tablet  Every 6 hours PRN     06/25/19 1620    predniSONE (STERAPRED UNI-PAK 21 TAB) 10 MG (21) TBPK tablet     06/25/19 1620             Tracy Larsen was evaluated in Emergency Department on 06/25/2019 for the symptoms described in the history of present illness. She was evaluated in the context of the global COVID-19 pandemic, which necessitated consideration that the patient might be at risk for infection with the SARS-CoV-2 virus that causes COVID-19. Institutional protocols and algorithms that pertain to the evaluation of patients at risk for COVID-19 are in a state of rapid change based on information released by regulatory bodies including the CDC and federal and state organizations. These policies and algorithms were followed during the patient's care in the ED.   Note:  This document was prepared using Dragon voice recognition software and may include unintentional dictation errors.   Chinita Pester, FNP 06/25/19 1713    Jene Every, MD 06/26/19 [phone]

## 2019-06-25 NOTE — Discharge Instructions (Addendum)
Please schedule follow-up appointment with her gastroenterologist.  If unable to see the gastroenterologist see her primary care provider.  Take the prednisone and pain medication as prescribed.  Return to the ER for symptoms of concern if you are unable to see the GI specialist or primary care provider.

## 2019-06-25 NOTE — ED Notes (Signed)
Patient is concerned that the massage exacerbated the lower back pain. Patient also c/o prolonged diarrhea x1 after using ibuprofen. Patient states that today the pain returned and had watery diarrhea after eating breakfast. Patient tried to contact her gastroenterologist but couldn't get an appointment. Patient reports lumbar pain is constant, but pelvic pain happens after eating. Patient took Pepto-bismol on Monday.

## 2019-06-26 ENCOUNTER — Encounter: Payer: Self-pay | Admitting: Family Medicine

## 2019-06-29 NOTE — Telephone Encounter (Signed)
Received the following message from patient:   "I would like if you could take a look at my CT that was done 5/29 and advise if this is urgent or of great concern. It is saying Bibasilar electasis and I was told this 2018 after having colonoscopy put to sleep and one week later having flu and seen ER.  I have not been advised of collapse Lung since but have been having trouble intermittent breathing more in heat and with mask wear.  I was seen in ER this weekend with pelvic pain and believe this was reaction to Advil I had taken for my back.  I have IBS /Colitis and normally do not take Advil. Was told I had inflammation around ileocecal valve and given Prednisone.  So far Day 2 I am much relieved from pain pelvic and back.  Please advise if I need to persue testing   I was coming in for breathing test but I do not have time off work for a while but if this is serious I will work it out   I noticed it was on report from 02/2019 when I was at [employer]   Appreciate hearing back ..."  It appears she had a CT abdomen pelvis with contrast on 5/29.   Dr. Isaiah Serge, please advise. Thanks!

## 2019-06-30 NOTE — Telephone Encounter (Signed)
I have reviewed the scan. The changes in the base of the lung are minimal and not concerning. It is unchanged since her last scan in feb 2020

## 2019-09-13 ENCOUNTER — Ambulatory Visit: Payer: BC Managed Care – PPO | Admitting: Gastroenterology

## 2019-11-07 ENCOUNTER — Other Ambulatory Visit: Payer: Self-pay

## 2019-11-07 ENCOUNTER — Encounter: Payer: Self-pay | Admitting: *Deleted

## 2019-11-07 ENCOUNTER — Ambulatory Visit
Admission: EM | Admit: 2019-11-07 | Discharge: 2019-11-07 | Disposition: A | Discharge status: Home / Self Care | Payer: BC Managed Care – PPO | Attending: Emergency Medicine | Admitting: Emergency Medicine

## 2019-11-07 DIAGNOSIS — Z20822 Contact with and (suspected) exposure to covid-19: Secondary | ICD-10-CM

## 2019-11-07 DIAGNOSIS — J019 Acute sinusitis, unspecified: Secondary | ICD-10-CM

## 2019-11-07 MED ORDER — CEFDINIR 300 MG PO CAPS: 300.0000 mg | ORAL_CAPSULE | Freq: Two times a day (BID) | ORAL | 0 refills | Status: DC

## 2019-11-07 NOTE — ED Triage Notes (Signed)
Pt reports over a week ago she started having HS . Pt returned from beach 1 week ago and has had congestion and HA . To day PT reports a changed in taste . RT ear drainage. Pt has not had afever . Pt has had chills this AM.

## 2019-11-07 NOTE — ED Provider Notes (Signed)
[name]    CSN: [id number] Arrival date & time: 11/07/19  1118      History   Chief Complaint Chief Complaint  Patient presents with   Migraine    HPI Tracy Larsen is a 59 y.o. female presenting today for evaluation of headache, chills and URI symptoms.  Patient reports she has had intermittent congestion and drainage for 3 weeks.  Reports this is typical for her allergies which she typically has flared between August and October.  She is not currently on any allergy medicines as she does not tolerate antihistamines and nasal spray as well.  Use of Mucinex in the past.  Today woke up with worsening symptoms of headache, chills as well as slight loss of taste and smell.  Recently was on vacation at the beach.   HPI  Past Medical History:  Diagnosis Date   Allergy    Arthritis    Patient denies.    Asthma    Endometriosis    GERD (gastroesophageal reflux disease)    Herpes    HSV (herpes simplex virus) infection    IBS (irritable bowel syndrome)    Insomnia    Mitral valve prolapse    UTI (urinary tract infection)    chronic history r/t bladder reflux which was repaired,    Patient Active Problem List   Diagnosis Date Noted   Vertigo 12/09/2018   Transient visual disturbance, bilateral 07/02/2015   IBS (irritable bowel syndrome) 04/22/2013   Mitral valve prolapse 09/24/2012   TIA (transient ischemic attack) 09/24/2012    Past Surgical History:  Procedure Laterality Date   BLADDER REPAIR     AGE 15   NASAL SINUS SURGERY     PELVIC LAPAROSCOPY     X 5   URETHRAL STRICTURE DILATATION      OB History    Gravida  1   Para  1   Term  1   Preterm      AB      Living  1     SAB      TAB      Ectopic      Multiple      Live Births               Home Medications    Prior to Admission medications   Medication Sig Start Date End Date Taking? Authorizing Provider  acetaminophen (TYLENOL) 325 MG  tablet Take 650 mg by mouth every 6 (six) hours as needed.    [provider]  acyclovir (ZOVIRAX) 400 MG tablet Take 1 tablet (400 mg total) by mouth 2 (two) times daily. Uses as needed 12/14/17   Copland, Gwenlyn Found, MD  Budeson-Glycopyrrol-Formoterol (BREZTRI AEROSPHERE) 160-9-4.8 MCG/ACT AERO Inhale 2 puffs into the lungs 2 (two) times daily. 02/25/19   Mannam, Colbert Coyer, MD  budesonide-formoterol (SYMBICORT) 160-4.5 MCG/ACT inhaler Inhale 2 puffs into the lungs 2 (two) times daily. 02/25/19   Mannam, Colbert Coyer, MD  cefdinir (OMNICEF) 300 MG capsule Take 1 capsule (300 mg total) by mouth 2 (two) times daily. 11/07/19   Leronda Lewers C, PA-C  estradiol (ESTRACE) 1 MG tablet Take 1 mg by mouth daily.    [provider]  medroxyPROGESTERone (PROVERA) 2.5 MG tablet Take 2.5 mg by mouth daily.    [provider]  montelukast (SINGULAIR) 10 MG tablet Take 10 mg by mouth at bedtime.  02/21/19 02/21/20  [provider]  omeprazole (PRILOSEC) 40 MG capsule TAKE 1 CAPSULE  BY MOUTH EVERY DAY 06/21/18   Copland, Gwenlyn Found, MD  predniSONE (STERAPRED UNI-PAK 21 TAB) 10 MG (21) TBPK tablet Take 6 tablets on the first day and decrease by 1 tablet each day until finished. 06/25/19   Triplett, Rulon Eisenmenger B, FNP  Probiotic CAPS Take 1 capsule by mouth. Sometimes    [provider]    Family History Family History  Problem Relation Age of Onset   Cancer Mother        VULVAR CANCER   Heart disease Father        MI in his 54s   Cancer Brother        SARCOMA   Heart disease Brother        WPW   Diabetes Paternal Grandmother    Colon cancer Neg Hx     Social History Social History   Tobacco Use   Smoking status: Former Smoker    Quit date: 01/27/2001    Years since quitting: 18.7   Smokeless tobacco: Never Used  Vaping Use   Vaping Use: Never used  Substance Use Topics   Alcohol use: Yes    Alcohol/week: 1.0 standard drink    Types: 1 Glasses of wine per  week   Drug use: No     Allergies   Azithromycin, Breo ellipta [fluticasone furoate-vilanterol], Entex, Erythromycin, Promethazine, Stadol [butorphanol], Tamiflu [oseltamivir phosphate], Zofran [ondansetron hcl], Augmentin [amoxicillin-pot clavulanate], Ciprofloxacin, Levofloxacin, Moxifloxacin hcl in nacl, Sulfa antibiotics, and Tetracyclines & related   Review of Systems Review of Systems  Constitutional: Positive for chills. Negative for activity change, appetite change, fatigue and fever.  HENT: Positive for congestion, rhinorrhea and sinus pressure. Negative for ear pain, sore throat and trouble swallowing.   Eyes: Negative for discharge and redness.  Respiratory: Positive for cough. Negative for chest tightness and shortness of breath.   Cardiovascular: Negative for chest pain.  Gastrointestinal: Negative for abdominal pain, diarrhea, nausea and vomiting.  Musculoskeletal: Negative for myalgias.  Skin: Negative for rash.  Neurological: Positive for headaches. Negative for dizziness and light-headedness.     Physical Exam Triage Vital Signs ED Triage Vitals  Enc Vitals Group     BP 11/07/19 1237 120/80     Pulse Rate 11/07/19 1237 (!) 58     Resp 11/07/19 1237 15     Temp 11/07/19 1237 97.7 F (36.5 C)     Temp Source 11/07/19 1237 Oral     SpO2 11/07/19 1237 98 %     Weight --      Height 11/07/19 1239 5\' 7"  (1.702 m)     Head Circumference --      Peak Flow --      Pain Score 11/07/19 1239 0     Pain Loc --      Pain Edu? --      Excl. in GC? --    No data found.  Updated Vital Signs BP 120/80 (BP Location: Left Arm)    Pulse (!) 58    Temp 97.7 F (36.5 C) (Oral)    Resp 15    Ht 5\' 7"  (1.702 m)    LMP 10/15/2011    SpO2 98%    BMI 31.32 kg/m   Visual Acuity Right Eye Distance:   Left Eye Distance:   Bilateral Distance:    Right Eye Near:   Left Eye Near:    Bilateral Near:     Physical Exam Vitals and nursing note reviewed.  Constitutional:  Appearance: She is well-developed.     Comments: No acute distress  HENT:     Head: Normocephalic and atraumatic.     Ears:     Comments: Bilateral ears without tenderness to palpation of external auricle, tragus and mastoid, EAC's without erythema or swelling, TM's with good bony landmarks and cone of light. Non erythematous.     Nose: Nose normal.     Mouth/Throat:     Comments: Oral mucosa pink and moist, no tonsillar enlargement or exudate. Posterior pharynx patent and nonerythematous, no uvula deviation or swelling. Normal phonation. Eyes:     Conjunctiva/sclera: Conjunctivae normal.  Cardiovascular:     Rate and Rhythm: Normal rate.  Pulmonary:     Effort: Pulmonary effort is normal. No respiratory distress.     Comments: Breathing comfortably at rest, CTABL, no wheezing, rales or other adventitious sounds auscultated Abdominal:     General: There is no distension.  Musculoskeletal:        General: Normal range of motion.     Cervical back: Neck supple.  Skin:    General: Skin is warm and dry.  Neurological:     Mental Status: She is alert and oriented to person, place, and time.      UC Treatments / Results  Labs (all labs ordered are listed, but only abnormal results are displayed) Labs Reviewed  NOVEL CORONAVIRUS, NAA    EKG   Radiology No results found.  Procedures Procedures (including critical care time)  Medications Ordered in UC Medications - No data to display  Initial Impression / Assessment and Plan / UC Course  I have reviewed the triage vital signs and the nursing notes.  Pertinent labs & imaging results that were available during my care of the patient were reviewed by me and considered in my medical decision making (see chart for details).     Covid test pending, URI symptoms intermittently for weeks, recent worsening, treating for sinusitis with Omnicef as this is the best tolerated antibiotic for patient.  Rest and fluids.  Discussed  strict return precautions. Patient verbalized understanding and is agreeable with plan.  Final Clinical Impressions(s) / UC Diagnoses   Final diagnoses:  Acute sinusitis with symptoms > 10 days  Encounter for laboratory testing for COVID-19 virus     Discharge Instructions     COVID test pending Omnicef twice daily for 1 week Rest and fluids Follow up if not improving or worsening    ED Prescriptions    Medication Sig Dispense Auth. Provider   cefdinir (OMNICEF) 300 MG capsule Take 1 capsule (300 mg total) by mouth 2 (two) times daily. 14 capsule Amerie Beaumont, [place] C, PA-C     PDMP not reviewed this encounter.   Lew Dawes, PA-C 11/07/19 1325

## 2019-11-07 NOTE — Discharge Instructions (Signed)
COVID test pending Omnicef twice daily for 1 week Rest and fluids Follow up if not improving or worsening

## 2019-11-08 LAB — NOVEL CORONAVIRUS, NAA: SARS-CoV-2, NAA: NOT DETECTED

## 2019-11-08 LAB — SARS-COV-2, NAA 2 DAY TAT

## 2019-11-25 ENCOUNTER — Ambulatory Visit (INDEPENDENT_AMBULATORY_CARE_PROVIDER_SITE_OTHER): Payer: BC Managed Care – PPO

## 2019-11-25 ENCOUNTER — Other Ambulatory Visit: Payer: Self-pay

## 2019-11-25 DIAGNOSIS — Z23 Encounter for immunization: Secondary | ICD-10-CM

## 2019-11-25 NOTE — Progress Notes (Signed)
Pre visit review using our clinic review tool, if applicable. No additional management support is needed unless otherwise documented below in the visit note.  Patient here for flu vaccine. 0.47mL flu vaccine given in left thigh IM. Patient tolerated well. VIS given.

## 2019-12-05 DIAGNOSIS — G43109 Migraine with aura, not intractable, without status migrainosus: Secondary | ICD-10-CM | POA: Diagnosis not present

## 2019-12-05 DIAGNOSIS — H539 Unspecified visual disturbance: Secondary | ICD-10-CM | POA: Diagnosis not present

## 2019-12-05 DIAGNOSIS — H04123 Dry eye syndrome of bilateral lacrimal glands: Secondary | ICD-10-CM | POA: Diagnosis not present

## 2019-12-05 DIAGNOSIS — H2513 Age-related nuclear cataract, bilateral: Secondary | ICD-10-CM | POA: Diagnosis not present

## 2019-12-28 ENCOUNTER — Other Ambulatory Visit: Payer: Self-pay | Admitting: Family Medicine

## 2020-01-06 IMAGING — CT CT ABD-PELV W/ CM
2 of 5 series · 16 of 46 positions shown, 18 images · IV contrast (APPLIED)
Comparison: 08/23/2016, 11/13/2014

CLINICAL DATA: 57-year-old female with a history of constipation

EXAM:
CT ABDOMEN AND PELVIS WITH CONTRAST
TECHNIQUE: Multidetector CT imaging of the abdomen and pelvis was performed
using the standard protocol following bolus administration of
intravenous contrast.
CONTRAST:  100mL EBWKNM-BRR IOPAMIDOL (EBWKNM-BRR) INJECTION 61%,
15mL EBWKNM-BRR IOPAMIDOL (EBWKNM-BRR) INJECTION 61%

[Series 2: axial st · axial · 0.98mm/px · z∈[-512,-87]mm · 13 of 95 slices shown, 15 images]
[im 5/95  soft-tissue]
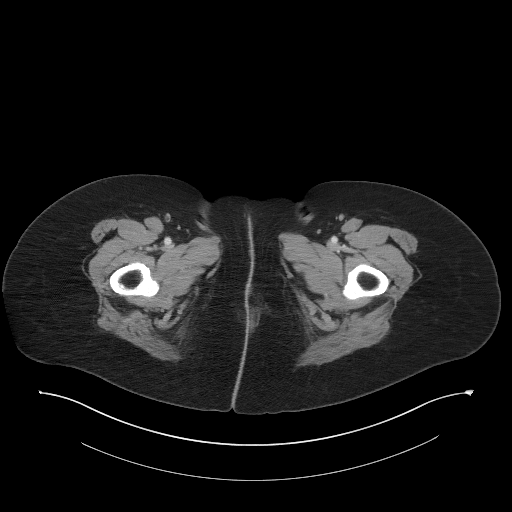
[im 5/95  bone]
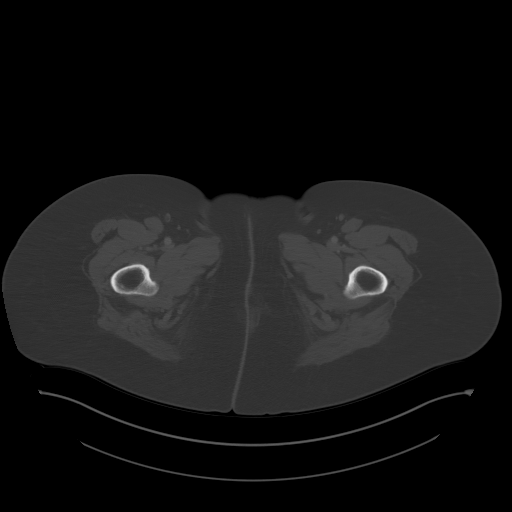
[im 15/95  soft-tissue]
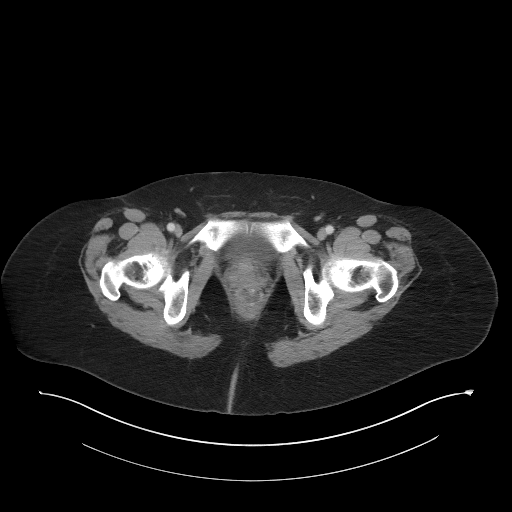
[im 19/95  soft-tissue]
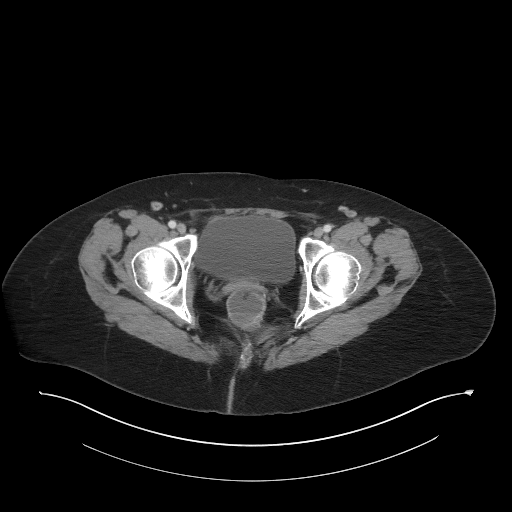
[im 29/95  soft-tissue]
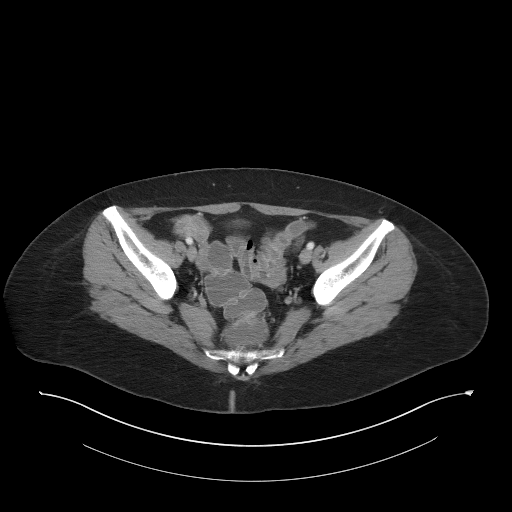
[im 33/95  soft-tissue]
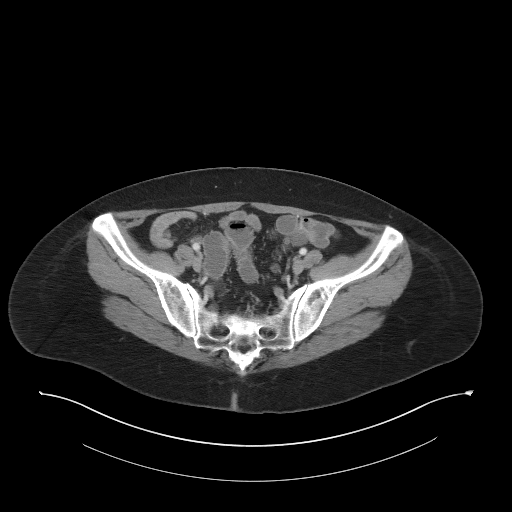
[im 43/95  soft-tissue]
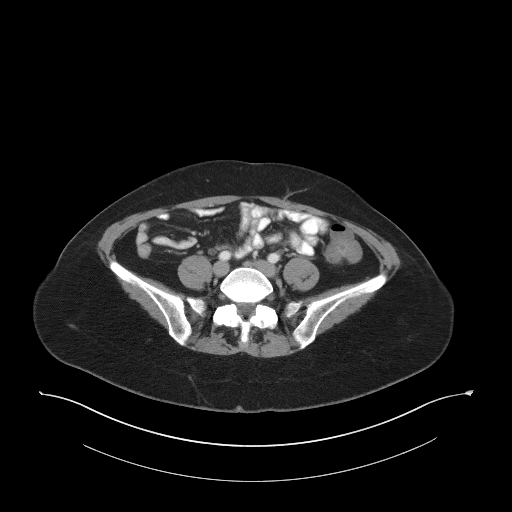
[im 48/95  soft-tissue]
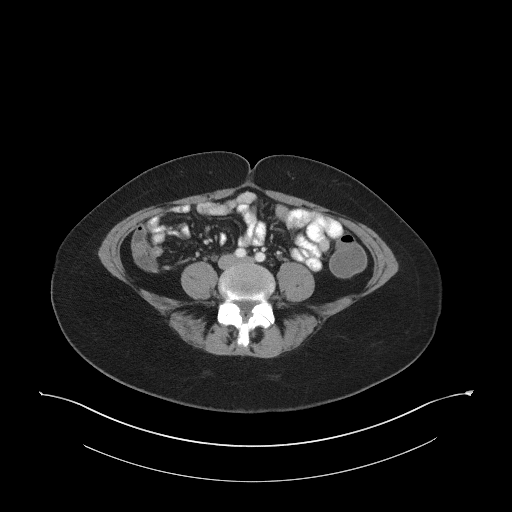
[im 52/95  soft-tissue]
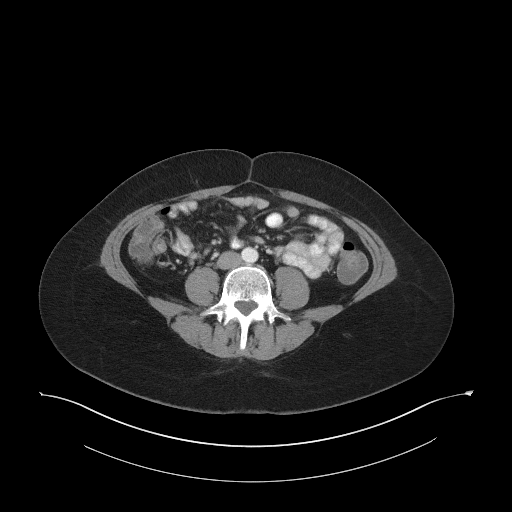
[im 62/95  soft-tissue]
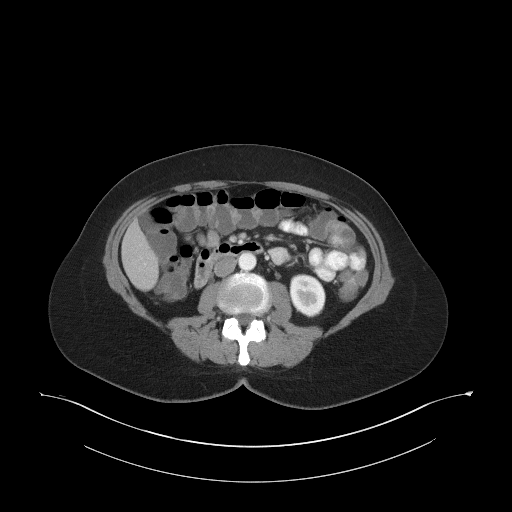
[im 62/95  bone]
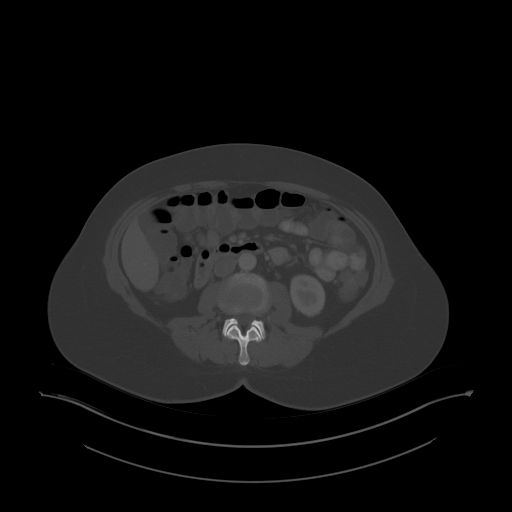
[im 66/95  soft-tissue]
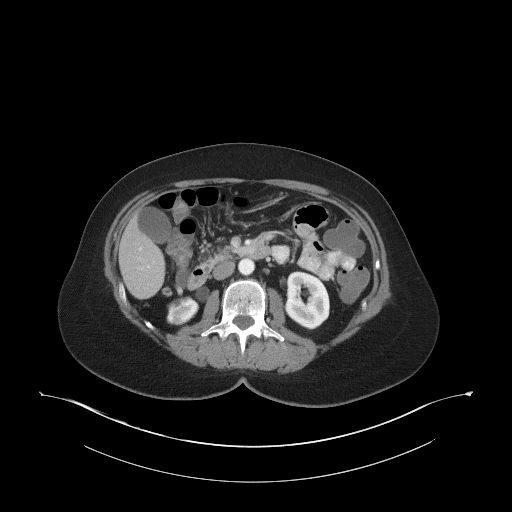
[im 76/95  soft-tissue]
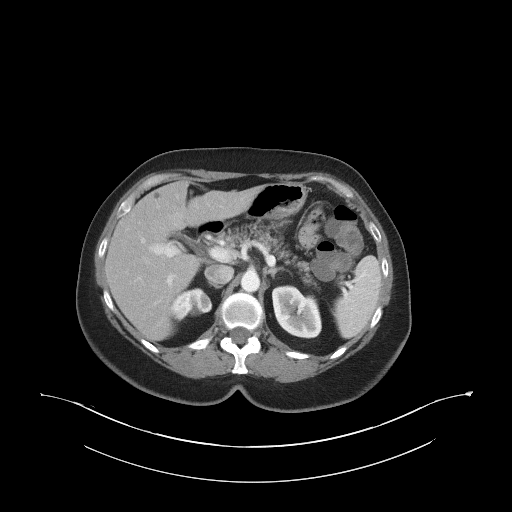
[im 80/95  soft-tissue]
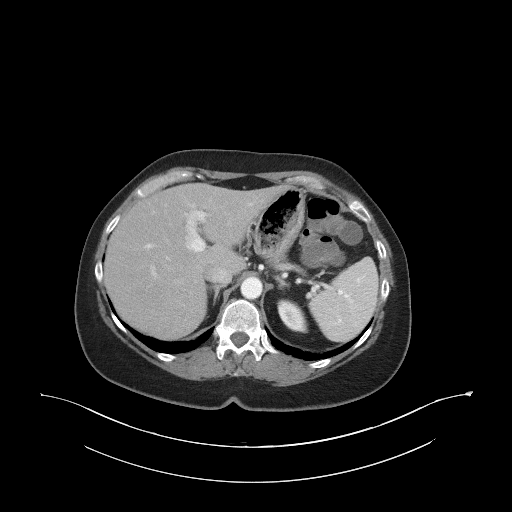
[im 90/95  soft-tissue]
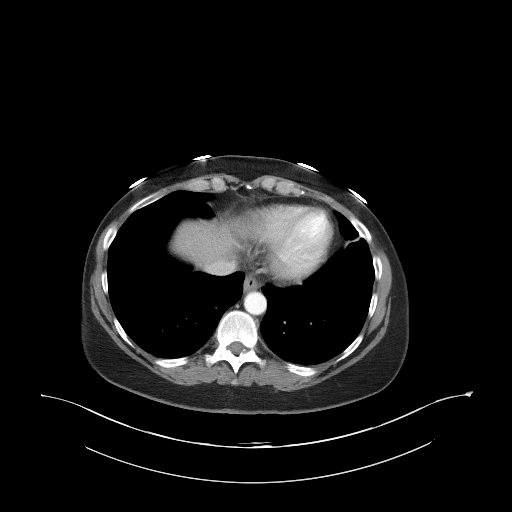

[Series 5: coronal st · coronal · 0.87mm/px · 3 of 91 slices shown]
[im 31/91  soft-tissue]
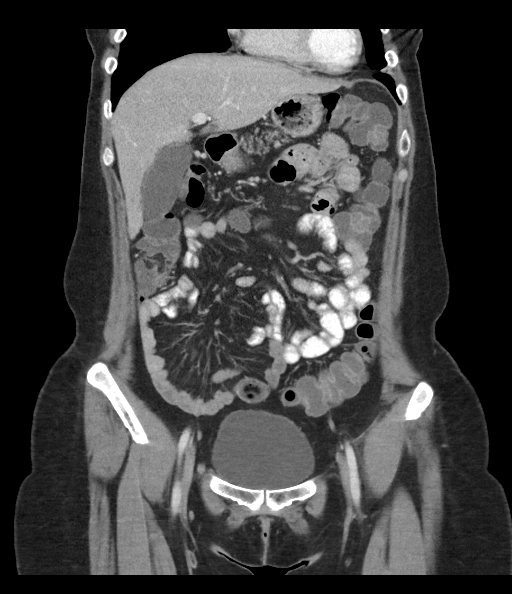
[im 41/91  soft-tissue]
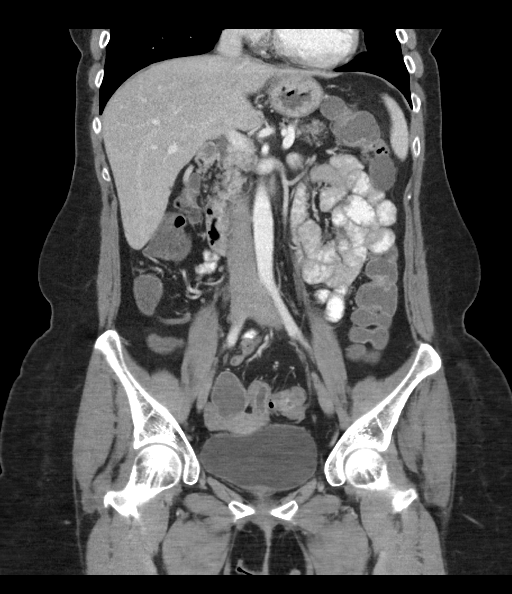
[im 51/91  soft-tissue]
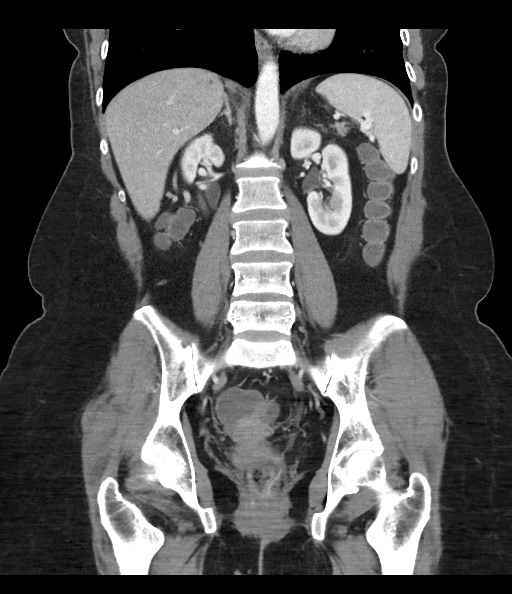

[16 of 46 positions shown; findings below may reference images not displayed]

FINDINGS: Lower chest: No acute abnormality.

Hepatobiliary: There are multiple low-density indeterminate lesions
of the liver. All of these were present on the CT dated [DATE],
unchanged. Unremarkable gallbladder.

Pancreas: Unremarkable pancreas

Spleen: Unremarkable spleen

Adrenals/Urinary Tract: Unremarkable adrenal glands.

Right kidney demonstrates several regions of cortical thinning. No
inflammatory changes. No hydronephrosis. Low-density cystic
structure on the inferior renal cortex compatible with Bosniak 1
cyst. Extrarenal pelvis.

Left kidney with no hydronephrosis or nephrolithiasis. Unremarkable
course of the left ureter.

Unremarkable urinary bladder.

Stomach/Bowel: Unremarkable appearance of the stomach. Small hiatal
hernia. Unremarkable small bowel. No abnormal distention. Enteric
contrast traverses the length of small bowel. No transition point.
No focal inflammation.

Normal appendix.

The colon is fluid-filled throughout its length. No transition. No
formed stool within the colon. No focal inflammatory changes or wall
thickening.

Vascular/Lymphatic: No significant vascular findings are present. No
enlarged abdominal or pelvic lymph nodes.

Reproductive: Unremarkable uterus and adnexa

Other: Non

Musculoskeletal: No acute displaced fracture. No significant
degenerative changes.
IMPRESSION: The length of the colon is fluid-filled, which may indicate
nonspecific enteritis/colitis. Recommend correlation with any
symptoms. No evidence of bowel obstruction.

Normal appendix.

Multiple regions of cortical thinning of the right kidney,
potentially representing prior episodes of infarction/infection.

Small hiatal

## 2020-01-22 ENCOUNTER — Other Ambulatory Visit: Payer: Self-pay | Admitting: Family Medicine

## 2020-01-28 ENCOUNTER — Other Ambulatory Visit: Payer: Self-pay | Admitting: Family Medicine

## 2020-03-14 ENCOUNTER — Telehealth: Payer: Self-pay | Admitting: Pulmonary Disease

## 2020-03-14 NOTE — Telephone Encounter (Addendum)
Spoke with the pt  She states that she received a lab bill Jan 2021 that was $870  She called [facility] billing to see why this cost her so much, and was advised that the provider's NPI was filed incorrectly She states that it appears the NPI was an out of state MD and that it's up to our office to get this corrected  She states that over the past year she was told by "our office" that this was going to be taken care of  She states that she just received notice from a collection agency and is upset about this since she was under the impression this had been handled  I advised will talk with administration and see what we can do to fix this Per Philipp Deputy- email Marisue Ivan Email sent and will await her response  Will hold in triage

## 2020-03-15 NOTE — Telephone Encounter (Signed)
I spoke with the pt and notified that after our conversation 03/14/20, I sent email to Marisue Ivan to look into this, and that we would call her back once we know more. Pt verbalized understanding and was appreciative of the call.

## 2020-03-19 NOTE — Telephone Encounter (Signed)
LM to inform patient we are still working on this.   Will route message to Marisue Ivan for f/u.

## 2020-03-20 NOTE — Telephone Encounter (Signed)
As of today I have not received email back from [facility] regarding this.

## 2020-03-21 ENCOUNTER — Ambulatory Visit
Admission: EM | Admit: 2020-03-21 | Discharge: 2020-03-21 | Disposition: A | Discharge status: Home / Self Care | Payer: BC Managed Care – PPO | Attending: Emergency Medicine | Admitting: Emergency Medicine

## 2020-03-21 ENCOUNTER — Other Ambulatory Visit: Payer: Self-pay

## 2020-03-21 ENCOUNTER — Ambulatory Visit (INDEPENDENT_AMBULATORY_CARE_PROVIDER_SITE_OTHER): Payer: BC Managed Care – PPO

## 2020-03-21 DIAGNOSIS — M79622 Pain in left upper arm: Secondary | ICD-10-CM

## 2020-03-21 DIAGNOSIS — M25512 Pain in left shoulder: Secondary | ICD-10-CM | POA: Insufficient documentation

## 2020-03-21 LAB — POCT URINALYSIS DIP (MANUAL ENTRY)
Bilirubin, UA: NEGATIVE
Glucose, UA: NEGATIVE mg/dL
Ketones, POC UA: NEGATIVE mg/dL
Leukocytes, UA: NEGATIVE
Nitrite, UA: NEGATIVE
Protein Ur, POC: NEGATIVE mg/dL
Spec Grav, UA: 1.01 (ref 1.010–1.025)
Urobilinogen, UA: 0.2 E.U./dL
pH, UA: 7 (ref 5.0–8.0)

## 2020-03-21 MED ORDER — PREDNISONE 10 MG PO TABS: 30.0000 mg | ORAL_TABLET | Freq: Every day | ORAL | 0 refills | Status: AC

## 2020-03-21 MED ORDER — TIZANIDINE HCL 4 MG PO TABS: 2.0000 mg | ORAL_TABLET | Freq: Four times a day (QID) | ORAL | 0 refills | Status: DC | PRN

## 2020-03-21 NOTE — Discharge Instructions (Signed)
Urine normal Prednisone 20-30 mg daily x 5 days with food Tizanidine 2-4 mg at home/bedtime  Follow up with orthopedics

## 2020-03-21 NOTE — ED Triage Notes (Signed)
Pt c/o chronic lt shoulder pain, worse since December d/t cold weather. States today has tingling to lt hand and bone pain to lt upper arm. States using OTC pain reliever gels, ice and heat with no relief. States also has chronic bladder infections and woke up today with bladder pressure on urination.

## 2020-03-21 NOTE — ED Provider Notes (Signed)
[name]    CSN: [id number] Arrival date & time: 03/21/20  1604      History   Chief Complaint Chief Complaint  Patient presents with  . Shoulder Pain  . Urinary Tract Infection    HPI Tracy Larsen is a 60 y.o. female history of IBS, presenting today for evaluation of left shoulder pain and possible UTI.  Patient reports that she has chronic left shoulder pain, worsened over the past few months in the cold weather.  Today has developed tingling into left hand and upper arm.  Using over-the-counter medicines without relief as well as trying ice and heat.  Reports pain has largely been in her humerus of recently.  Recalls remote history of skin cancer removal on the side and reports that pain feels within the bone.  Has history of frozen shoulder on the right, and feels different than those.  She has remained full range of motion of her shoulder, but pain will come randomly without specific triggers or certain movements.  Reports history of chronic UTIs, woke up today with increased bladder pressure with urination.  Also reports history of IBS.  Denies dysuria, feels slightly different than typical UTI symptoms.  HPI  Past Medical History:  Diagnosis Date  . Allergy   . Arthritis    Patient denies.   . Asthma   . Endometriosis   . GERD (gastroesophageal reflux disease)   . Herpes   . HSV (herpes simplex virus) infection   . IBS (irritable bowel syndrome)   . Insomnia   . Mitral valve prolapse   . UTI (urinary tract infection)    chronic history r/t bladder reflux which was repaired,    Patient Active Problem List   Diagnosis Date Noted  . Vertigo 12/09/2018  . Transient visual disturbance, bilateral 07/02/2015  . IBS (irritable bowel syndrome) 04/22/2013  . Mitral valve prolapse 09/24/2012  . TIA (transient ischemic attack) 09/24/2012    Past Surgical History:  Procedure Laterality Date  . BLADDER REPAIR     AGE 29  . NASAL SINUS SURGERY    .  PELVIC LAPAROSCOPY     X 5  . URETHRAL STRICTURE DILATATION      OB History    Gravida  1   Para  1   Term  1   Preterm      AB      Living  1     SAB      IAB      Ectopic      Multiple      Live Births               Home Medications    Prior to Admission medications   Medication Sig Start Date End Date Taking? Authorizing Provider  predniSONE (DELTASONE) 10 MG tablet Take 3 tablets (30 mg total) by mouth daily with breakfast for 5 days. 03/21/20 03/26/20 Yes Marykay Mccleod C, PA-C  tiZANidine (ZANAFLEX) 4 MG tablet Take 0.5-1 tablets (2-4 mg total) by mouth every 6 (six) hours as needed for muscle spasms. 03/21/20  Yes Kemiya Batdorf C, PA-C  acetaminophen (TYLENOL) 325 MG tablet Take 650 mg by mouth every 6 (six) hours as needed.    [provider]  acyclovir (ZOVIRAX) 400 MG tablet Take 1 tablet (400 mg total) by mouth 2 (two) times daily. Uses as needed 12/14/17   Copland, Gwenlyn FoundJessica C, MD  estradiol (ESTRACE) 1 MG tablet Take 1 mg by mouth daily.  [provider]  medroxyPROGESTERone (PROVERA) 2.5 MG tablet Take 2.5 mg by mouth daily.    [provider]  omeprazole (PRILOSEC) 40 MG capsule Take 1 capsule (40 mg total) by mouth daily. 01/23/20   Copland, Gwenlyn Found, MD  Probiotic CAPS Take 1 capsule by mouth. Sometimes    [provider]    Family History Family History  Problem Relation Age of Onset  . Cancer Mother        VULVAR CANCER  . Heart disease Father        MI in his 15s  . Cancer Brother        SARCOMA  . Heart disease Brother        WPW  . Diabetes Paternal Grandmother   . Colon cancer Neg Hx     Social History Social History   Tobacco Use  . Smoking status: Former Smoker    Quit date: 01/27/2001    Years since quitting: 19.1  . Smokeless tobacco: Never Used  Vaping Use  . Vaping Use: Never used  Substance Use Topics  . Alcohol use: Yes    Alcohol/week: 1.0 standard drink    Types: 1 Glasses  of wine per week  . Drug use: No     Allergies   Azithromycin, Breo ellipta [fluticasone furoate-vilanterol], Entex, Erythromycin, Promethazine, Stadol [butorphanol], Tamiflu [oseltamivir phosphate], Zofran [ondansetron hcl], Augmentin [amoxicillin-pot clavulanate], Ciprofloxacin, Levofloxacin, Moxifloxacin hcl in nacl, Sulfa antibiotics, and Tetracyclines & related   Review of Systems Review of Systems  Constitutional: Negative for fever.  Respiratory: Negative for shortness of breath.   Cardiovascular: Negative for chest pain.  Gastrointestinal: Positive for abdominal pain. Negative for diarrhea, nausea and vomiting.  Genitourinary: Positive for dysuria. Negative for flank pain, genital sores, hematuria, menstrual problem, vaginal bleeding, vaginal discharge and vaginal pain.  Musculoskeletal: Positive for arthralgias. Negative for back pain.  Skin: Negative for rash.  Neurological: Negative for dizziness, light-headedness and headaches.     Physical Exam Triage Vital Signs ED Triage Vitals  Enc Vitals Group     BP 03/21/20 1619 (!) 153/88     Pulse Rate 03/21/20 1619 95     Resp 03/21/20 1619 18     Temp 03/21/20 1619 98 F (36.7 C)     Temp Source 03/21/20 1619 Oral     SpO2 03/21/20 1619 94 %     Weight --      Height --      Head Circumference --      Peak Flow --      Pain Score 03/21/20 1620 2     Pain Loc --      Pain Edu? --      Excl. in GC? --    No data found.  Updated Vital Signs BP (!) 153/88 (BP Location: Left Arm)   Pulse 95   Temp 98 F (36.7 C) (Oral)   Resp 18   LMP 10/15/2011   SpO2 94%   Visual Acuity Right Eye Distance:   Left Eye Distance:   Bilateral Distance:    Right Eye Near:   Left Eye Near:    Bilateral Near:     Physical Exam Vitals and nursing note reviewed.  Constitutional:      Appearance: She is well-developed and well-nourished.     Comments: No acute distress  HENT:     Head: Normocephalic and atraumatic.      Nose: Nose normal.  Eyes:     Conjunctiva/sclera: Conjunctivae  normal.  Cardiovascular:     Rate and Rhythm: Normal rate.  Pulmonary:     Effort: Pulmonary effort is normal. No respiratory distress.  Abdominal:     General: There is no distension.  Musculoskeletal:        General: Normal range of motion.     Cervical back: Neck supple.     Comments: Left shoulder: No obvious swelling or deformity, no discoloration or rashes noted, full active range of motion of left shoulder, nontender to palpation of her clavicle, AC joint or scapular spine, mild tenderness to periscapular musculature and to mid lateral humerus extending slightly distally, nontender to palpation over bony prominences of elbow; negative resisted external rotation, negative liftoff  Strength at shoulders 5/5 ankle bilaterally, grip strength 5/5 ankle bilaterally, radial pulse 2+, sensation intact distally  Skin:    General: Skin is warm and dry.  Neurological:     Mental Status: She is alert and oriented to person, place, and time.  Psychiatric:        Mood and Affect: Mood and affect normal.      UC Treatments / Results  Labs (all labs ordered are listed, but only abnormal results are displayed) Labs Reviewed  POCT URINALYSIS DIP (MANUAL ENTRY) - Abnormal; Notable for the following components:      Result Value   Blood, UA trace-intact (*)    All other components within normal limits  URINE CULTURE    EKG   Radiology DG Humerus Left  Result Date: 03/21/2020 CLINICAL DATA:  Mid humeral pain for months EXAM: LEFT HUMERUS - 2+ VIEW COMPARISON:  None. FINDINGS: Frontal and lateral views of the left humerus are obtained. No acute or destructive bony lesion. Alignment of the left shoulder and elbow is anatomic. Soft tissues are normal. IMPRESSION: 1. Unremarkable left humerus. Electronically Signed   By: Sharlet Salina M.D.   On: 03/21/2020 17:31    Procedures Procedures (including critical care  time)  Medications Ordered in UC Medications - No data to display  Initial Impression / Assessment and Plan / UC Course  I have reviewed the triage vital signs and the nursing notes.  Pertinent labs & imaging results that were available during my care of the patient were reviewed by me and considered in my medical decision making (see chart for details).    1.  Shoulder/arm pain-x-ray unremarkable, no sign of bony lesions, cannot take NSAIDs due to her stomach, will do trial of prednisone x5 days along with muscle relaxers for more trapezius/upper back and neck pain.  Follow-up with orthopedic specialist medicine if continuing to persist.  Low suspicion of frozen shoulder rotator cuff tear at this time, full active range of motion and strength intact.  2.  Lower abdominal pressure-urine unremarkable, push fluids and continue to monitor, culture pending  Discussed strict return precautions. Patient verbalized understanding and is agreeable with plan.  Final Clinical Impressions(s) / UC Diagnoses   Final diagnoses:  Acute pain of left shoulder     Discharge Instructions     Urine normal Prednisone 20-30 mg daily x 5 days with food Tizanidine 2-4 mg at home/bedtime  Follow up with orthopedics    ED Prescriptions    Medication Sig Dispense Auth. Provider   predniSONE (DELTASONE) 10 MG tablet Take 3 tablets (30 mg total) by mouth daily with breakfast for 5 days. 15 tablet Tatianna Ibbotson C, PA-C   tiZANidine (ZANAFLEX) 4 MG tablet Take 0.5-1 tablets (2-4 mg total) by mouth every  6 (six) hours as needed for muscle spasms. 30 tablet Jefrey Raburn, [place] C, PA-C     PDMP not reviewed this encounter.   Lew Dawes, [place] 03/21/20 1753

## 2020-03-22 NOTE — Telephone Encounter (Signed)
I called and spoke with [facility] about the pts concern---   Informed that the insurance processed the charges and stated that the bill was part of the pts copay and deductible.  The insurance company sent a check to the patient for $70.05 in June of 2021.  The balance that was left was 802.82 was the pts responsibility.     I have called and LM on VM for the pt to call our office back.

## 2020-03-22 NOTE — Telephone Encounter (Signed)
Received the following email back from [employer],  Apparently this bill is from [facility].  The only thing I know to do is call [facility], make sure they have Dr. Shirlee More correct NPI number.  As they told our [employer] billing that is why the labs were denied.    Marisue Ivan

## 2020-03-23 LAB — URINE CULTURE: Culture: NO GROWTH

## 2020-03-23 NOTE — Telephone Encounter (Signed)
lmtcb for pt.  Will need to get the number to her insurance to assure they have the correct NPI number for Dr. Isaiah Serge.

## 2020-03-28 NOTE — Telephone Encounter (Signed)
LMTCB

## 2020-03-29 NOTE — Telephone Encounter (Signed)
Lmtcb for pt.  

## 2020-03-29 NOTE — Telephone Encounter (Signed)
I have called [employer] [place] that is listed as the pts insurance for this year.  I was on hold for about 15 mins.  I did the automated part and had to put in PM's NPI number and it accepted it as what we have listed.  I am at a loss of what else to do for this issue.   Will need to call [employer] back.

## 2020-03-29 NOTE — Telephone Encounter (Signed)
Patient is returning phone call. Patient phone number is [phone]. May leave detailed message on voicemail.

## 2020-04-03 NOTE — Telephone Encounter (Signed)
Lmtcb for pt. Need to know if she received a letter from insurance or a phone call. Need to figure out who and how she was told that the NPI number is incorrect.

## 2020-04-09 ENCOUNTER — Ambulatory Visit
Admission: EM | Admit: 2020-04-09 | Discharge: 2020-04-09 | Disposition: A | Discharge status: Home / Self Care | Payer: BC Managed Care – PPO

## 2020-04-09 ENCOUNTER — Encounter: Payer: Self-pay | Admitting: Emergency Medicine

## 2020-04-09 ENCOUNTER — Other Ambulatory Visit: Payer: Self-pay

## 2020-04-09 DIAGNOSIS — J019 Acute sinusitis, unspecified: Secondary | ICD-10-CM | POA: Diagnosis not present

## 2020-04-09 MED ORDER — CEFDINIR 300 MG PO CAPS: 300.0000 mg | ORAL_CAPSULE | Freq: Two times a day (BID) | ORAL | 0 refills | Status: DC

## 2020-04-09 MED ORDER — PREDNISONE 10 MG PO TABS: ORAL_TABLET | ORAL | 0 refills | Status: DC

## 2020-04-09 NOTE — ED Triage Notes (Signed)
Patient started feeling bad one week ago.  Coughing up phlegm tinged with blood, usually in the morning.  Patient has a headache, intermittent chills..  Patient complains of chest congestion.  Patient complains of minimal "winded feeling"  Patient has a history of bronchitis and states she thinks this is what's going on now.

## 2020-04-09 NOTE — ED Provider Notes (Signed)
[name]    CSN: [id number] Arrival date & time: 04/09/20  1003      History   Chief Complaint Chief Complaint  Patient presents with  . Cough    HPI Tracy Larsen is a 60 y.o. female history of GERD, IBS, presenting today for evaluation of URI symptoms and cough.  Reports symptoms began approximately 1 week ago.  Reports blood-tinged mucus typically in the morning.  Has had associated headaches chills.  Slight shortness of breath.  Has felt similar with bronchitis in the past.  HPI  Past Medical History:  Diagnosis Date  . Allergy   . Arthritis    Patient denies.   . Asthma   . Endometriosis   . GERD (gastroesophageal reflux disease)   . Herpes   . HSV (herpes simplex virus) infection   . IBS (irritable bowel syndrome)   . Insomnia   . Mitral valve prolapse   . UTI (urinary tract infection)    chronic history r/t bladder reflux which was repaired,    Patient Active Problem List   Diagnosis Date Noted  . Vertigo 12/09/2018  . Transient visual disturbance, bilateral 07/02/2015  . IBS (irritable bowel syndrome) 04/22/2013  . Mitral valve prolapse 09/24/2012  . TIA (transient ischemic attack) 09/24/2012    Past Surgical History:  Procedure Laterality Date  . BLADDER REPAIR     AGE 84  . NASAL SINUS SURGERY    . PELVIC LAPAROSCOPY     X 5  . URETHRAL STRICTURE DILATATION      OB History    Gravida  1   Para  1   Term  1   Preterm      AB      Living  1     SAB      IAB      Ectopic      Multiple      Live Births               Home Medications    Prior to Admission medications   Medication Sig Start Date End Date Taking? Authorizing Provider  acetaminophen (TYLENOL) 325 MG tablet Take 650 mg by mouth every 6 (six) hours as needed.   Yes [provider]  cefdinir (OMNICEF) 300 MG capsule Take 1 capsule (300 mg total) by mouth 2 (two) times daily. 04/09/20  Yes Serria Sloma C, PA-C   Chlorpheniramine-Acetaminophen (CORICIDIN HBP COLD/FLU PO) Take by mouth.   Yes [provider]  estradiol (ESTRACE) 1 MG tablet Take 1 mg by mouth daily.   Yes [provider]  guaiFENesin (MUCINEX) 600 MG 12 hr tablet Take by mouth 2 (two) times daily.   Yes [provider]  medroxyPROGESTERone (PROVERA) 2.5 MG tablet Take 2.5 mg by mouth daily.   Yes [provider]  omeprazole (PRILOSEC) 40 MG capsule Take 1 capsule (40 mg total) by mouth daily. 01/23/20  Yes Copland, Gwenlyn Found, MD  predniSONE (DELTASONE) 10 MG tablet Begin with 6 tabs on day 1, 5 tab on day 2, 4 tab on day 3, 3 tab on day 4, 2 tab on day 5, 1 tab on day 6-take with food 04/09/20  Yes Ashley Montminy C, PA-C  acyclovir (ZOVIRAX) 400 MG tablet Take 1 tablet (400 mg total) by mouth 2 (two) times daily. Uses as needed 12/14/17   Copland, Gwenlyn Found, MD  Probiotic CAPS Take 1 capsule by mouth. Sometimes    [provider]  tiZANidine (ZANAFLEX) 4 MG tablet Take 0.5-1 tablets (2-4 mg total) by mouth every 6 (six) hours as needed for muscle spasms. 03/21/20   Kirk Sampley, Junius Creamer, PA-C    Family History Family History  Problem Relation Age of Onset  . Cancer Mother        VULVAR CANCER  . Heart disease Father        MI in his 68s  . Cancer Brother        SARCOMA  . Heart disease Brother        WPW  . Diabetes Paternal Grandmother   . Colon cancer Neg Hx     Social History Social History   Tobacco Use  . Smoking status: Former Smoker    Quit date: 01/27/2001    Years since quitting: 19.2  . Smokeless tobacco: Never Used  Vaping Use  . Vaping Use: Never used  Substance Use Topics  . Alcohol use: Yes    Alcohol/week: 1.0 standard drink    Types: 1 Glasses of wine per week  . Drug use: No     Allergies   Azithromycin, Breo ellipta [fluticasone furoate-vilanterol], Entex, Erythromycin, Promethazine, Stadol [butorphanol], Tamiflu [oseltamivir phosphate], Zofran [ondansetron  hcl], Augmentin [amoxicillin-pot clavulanate], Ciprofloxacin, Levofloxacin, Moxifloxacin hcl in nacl, Sulfa antibiotics, and Tetracyclines & related   Review of Systems Review of Systems  Constitutional: Positive for chills and fatigue. Negative for activity change, appetite change and fever.  HENT: Positive for congestion, rhinorrhea, sinus pressure and sore throat. Negative for ear pain and trouble swallowing.   Eyes: Negative for discharge and redness.  Respiratory: Positive for cough and shortness of breath. Negative for chest tightness.   Cardiovascular: Negative for chest pain.  Gastrointestinal: Negative for abdominal pain, diarrhea, nausea and vomiting.  Musculoskeletal: Negative for myalgias.  Skin: Negative for rash.  Neurological: Negative for dizziness, light-headedness and headaches.     Physical Exam Triage Vital Signs ED Triage Vitals  Enc Vitals Group     BP 04/09/20 1123 122/80     Pulse Rate 04/09/20 1123 66     Resp 04/09/20 1123 20     Temp 04/09/20 1123 97.9 F (36.6 C)     Temp Source 04/09/20 1123 Oral     SpO2 04/09/20 1123 95 %     Weight --      Height --      Head Circumference --      Peak Flow --      Pain Score 04/09/20 1116 0     Pain Loc --      Pain Edu? --      Excl. in GC? --    No data found.  Updated Vital Signs BP 122/80 (BP Location: Left Arm)   Pulse 66   Temp 97.9 F (36.6 C) (Oral)   Resp 20   LMP 10/15/2011   SpO2 95%   Visual Acuity Right Eye Distance:   Left Eye Distance:   Bilateral Distance:    Right Eye Near:   Left Eye Near:    Bilateral Near:     Physical Exam Vitals and nursing note reviewed.  Constitutional:      Appearance: She is well-developed.     Comments: No acute distress  HENT:     Head: Normocephalic and atraumatic.     Ears:     Comments: Bilateral ears without tenderness to palpation of external auricle, tragus and mastoid, EAC's without erythema or swelling, TM's with good bony landmarks  and  cone of light. Non erythematous.     Nose: Nose normal.     Mouth/Throat:     Comments: Oral mucosa pink and moist, no tonsillar enlargement or exudate. Posterior pharynx patent and nonerythematous, no uvula deviation or swelling. Normal phonation. Eyes:     Conjunctiva/sclera: Conjunctivae normal.  Cardiovascular:     Rate and Rhythm: Normal rate and regular rhythm.  Pulmonary:     Effort: Pulmonary effort is normal. No respiratory distress.     Comments: Breathing comfortably at rest, CTABL, no wheezing, rales or other adventitious sounds auscultated Abdominal:     General: There is no distension.  Musculoskeletal:        General: Normal range of motion.     Cervical back: Neck supple.  Skin:    General: Skin is warm and dry.  Neurological:     Mental Status: She is alert and oriented to person, place, and time.      UC Treatments / Results  Labs (all labs ordered are listed, but only abnormal results are displayed) Labs Reviewed - No data to display  EKG   Radiology No results found.  Procedures Procedures (including critical care time)  Medications Ordered in UC Medications - No data to display  Initial Impression / Assessment and Plan / UC Course  I have reviewed the triage vital signs and the nursing notes.  Pertinent labs & imaging results that were available during my care of the patient were reviewed by me and considered in my medical decision making (see chart for details).     Treating for sinusitis with Omnicef, as tolerated previously, symptoms greater than 1 week, also initiated on prednisone taper help with inflammation in sinuses/lungs.  Continue symptomatic and supportive care as needed as well.  Push fluids.  Discussed strict return precautions. Patient verbalized understanding and is agreeable with plan.  Final Clinical Impressions(s) / UC Diagnoses   Final diagnoses:  Acute sinusitis with symptoms > 10 days     Discharge Instructions      Begin Omnicef twice daily for 1 week Prednisone taper x6 days-begin with 6 tablets, decrease by 1 tablet each day until complete-take with food and earlier in the day if possible Follow-up if not improving or worsening    ED Prescriptions    Medication Sig Dispense Auth. Provider   cefdinir (OMNICEF) 300 MG capsule Take 1 capsule (300 mg total) by mouth 2 (two) times daily. 14 capsule Cleven Jansma C, PA-C   predniSONE (DELTASONE) 10 MG tablet Begin with 6 tabs on day 1, 5 tab on day 2, 4 tab on day 3, 3 tab on day 4, 2 tab on day 5, 1 tab on day 6-take with food 21 tablet Acel Natzke C, PA-C     PDMP not reviewed this encounter.   Lew Dawes, PA-C 04/09/20 1145

## 2020-04-09 NOTE — Discharge Instructions (Signed)
Begin Omnicef twice daily for 1 week Prednisone taper x6 days-begin with 6 tablets, decrease by 1 tablet each day until complete-take with food and earlier in the day if possible Follow-up if not improving or worsening

## 2020-04-10 NOTE — Telephone Encounter (Signed)
lmtcb for pt.  

## 2020-04-11 NOTE — Telephone Encounter (Signed)
Closing the encounter per protocol

## 2020-05-17 ENCOUNTER — Ambulatory Visit: Payer: BC Managed Care – PPO | Admitting: Family Medicine

## 2020-05-17 ENCOUNTER — Encounter: Payer: Self-pay | Admitting: Family Medicine

## 2020-05-17 ENCOUNTER — Other Ambulatory Visit: Payer: Self-pay

## 2020-05-17 VITALS — BP 112/72 | HR 91 | Temp 98.0°F | Resp 18 | Ht 67.0 in | Wt 200.0 lb

## 2020-05-17 DIAGNOSIS — R059 Cough, unspecified: Secondary | ICD-10-CM

## 2020-05-17 DIAGNOSIS — R5383 Other fatigue: Secondary | ICD-10-CM | POA: Diagnosis not present

## 2020-05-17 LAB — POCT INFLUENZA A/B
Influenza A, POC: NEGATIVE
Influenza B, POC: NEGATIVE

## 2020-05-17 MED ORDER — CEFDINIR 300 MG PO CAPS: 300.0000 mg | ORAL_CAPSULE | Freq: Two times a day (BID) | ORAL | 0 refills | Status: DC

## 2020-05-17 MED ORDER — PREDNISONE 20 MG PO TABS: ORAL_TABLET | ORAL | 0 refills | Status: DC

## 2020-05-17 NOTE — Patient Instructions (Addendum)
It was good to see you again today- I will be in touch with your covid testing results asap In the meantime we will treat you with antibiotics and also oral prednisone if you like  Please let me know if you are getting worse or have any other concerns

## 2020-05-17 NOTE — Progress Notes (Signed)
[facility] [address] [name], [place] [address] [phone]  Date:  05/17/2020   Name:  Tracy Larsen   DOB:  [birth year]   MRN:  [id number]  PCP:  Pearline Cables, MD    Chief Complaint: Headache (Started Sunday, Sore throat, headache, cough, negative covid test, taking mucinex, feels like its stuck in chest, chills, no known fever/) and Hand Numbness (Left hand numbness, and tingling, no chest pain)   History of Present Illness:  Tracy Larsen is a 60 y.o. very pleasant female patient who presents with the following:  Visit today for concern of illness Pt last seen by myself in 2020  She was seen at [facility] on 3/14 with sinusitis, treated with omnicef and prednisone  She felt like she got back to normal between that time and her current illness  Today is Thursday This past Sunday she went to church- while she was there she noted sudden onset of illness symptoms, with HA, ST, chills, fatigue Yesterday she felt like she was getting better, but worse again today  She has a cough but it is pretty dry She felt SOB last night She is not aware of a fever- but she has felt warm She has sneezing as well   She did a rapid covid 2 days ago and it was negative  She is vaccinated against covid and flu  No vomiting or diarrhea Patient Active Problem List   Diagnosis Date Noted  . Vertigo 12/09/2018  . Transient visual disturbance, bilateral 07/02/2015  . IBS (irritable bowel syndrome) 04/22/2013  . Mitral valve prolapse 09/24/2012  . TIA (transient ischemic attack) 09/24/2012    Past Medical History:  Diagnosis Date  . Allergy   . Arthritis    Patient denies.   . Asthma   . Endometriosis   . GERD (gastroesophageal reflux disease)   . Herpes   . HSV (herpes simplex virus) infection   . IBS (irritable bowel syndrome)   . Insomnia   . Mitral valve prolapse   . UTI (urinary tract infection)    chronic  history r/t bladder reflux which was repaired,    Past Surgical History:  Procedure Laterality Date  . BLADDER REPAIR     AGE 36  . NASAL SINUS SURGERY    . PELVIC LAPAROSCOPY     X 5  . URETHRAL STRICTURE DILATATION      Social History   Tobacco Use  . Smoking status: Former Smoker    Quit date: 01/27/2001    Years since quitting: 19.3  . Smokeless tobacco: Never Used  Vaping Use  . Vaping Use: Never used  Substance Use Topics  . Alcohol use: Yes    Alcohol/week: 1.0 standard drink    Types: 1 Glasses of wine per week  . Drug use: No    Family History  Problem Relation Age of Onset  . Cancer Mother        VULVAR CANCER  . Heart disease Father        MI in his 55s  . Cancer Brother        SARCOMA  . Heart disease Brother        WPW  . Diabetes Paternal Grandmother   . Colon cancer Neg Hx     Allergies  Allergen Reactions  . Azithromycin Other (See Comments)    GI Issues/IBS Exacerbation  . Breo Ellipta [Fluticasone Furoate-Vilanterol] Hives  .  Entex Other (See Comments)    Tongue peeling   . Erythromycin Diarrhea  . Promethazine Other (See Comments)    Made butt feel like it was on fire -- Suppository    . Stadol [Butorphanol] Other (See Comments)    dizziness  . Tamiflu [Oseltamivir Phosphate] Nausea And Vomiting  . Zofran [Ondansetron Hcl] Nausea Only and Other (See Comments)    Makes nausea much worse and dizziness   . Augmentin [Amoxicillin-Pot Clavulanate] Other (See Comments)    GI issues/IBS Exacerbation   . Ciprofloxacin Other (See Comments)    GI issues/IBS Exacerbation  . Levofloxacin Other (See Comments)    GI issues/IBS Exacerbation  . Moxifloxacin Hcl In Nacl Other (See Comments)    GI issues/IBS Exacerbation  . Sulfa Antibiotics Rash  . Tetracyclines & Related Rash    Medication list has been reviewed and updated.  Current Outpatient Medications on File Prior to Visit  Medication Sig Dispense Refill  . acetaminophen (TYLENOL) 325  MG tablet Take 650 mg by mouth every 6 (six) hours as needed.    Marland Kitchen acyclovir (ZOVIRAX) 400 MG tablet Take 1 tablet (400 mg total) by mouth 2 (two) times daily. Uses as needed 60 tablet 3  . Chlorpheniramine-Acetaminophen (CORICIDIN HBP COLD/FLU PO) Take by mouth.    . estradiol (ESTRACE) 1 MG tablet Take 1 mg by mouth daily.    Marland Kitchen guaiFENesin (MUCINEX) 600 MG 12 hr tablet Take by mouth 2 (two) times daily.    . medroxyPROGESTERone (PROVERA) 2.5 MG tablet Take 2.5 mg by mouth daily.    Marland Kitchen omeprazole (PRILOSEC) 40 MG capsule Take 1 capsule (40 mg total) by mouth daily. 30 capsule 0  . tiZANidine (ZANAFLEX) 4 MG tablet Take 0.5-1 tablets (2-4 mg total) by mouth every 6 (six) hours as needed for muscle spasms. 30 tablet 0  . cefdinir (OMNICEF) 300 MG capsule Take 1 capsule (300 mg total) by mouth 2 (two) times daily. (Patient not taking: Reported on 05/17/2020) 14 capsule 0   No current facility-administered medications on file prior to visit.    Review of Systems:  As per HPI- otherwise negative.   Physical Examination: Vitals:   05/17/20 1107  BP: 112/72  Pulse: 91  Resp: 18  Temp: 98 F (36.7 C)  SpO2: 98%   Vitals:   05/17/20 1107  Weight: 200 lb (90.7 kg)  Height: 5\' 7"  (1.702 m)   Body mass index is 31.32 kg/m. Ideal Body Weight: Weight in (lb) to have BMI = 25: 159.3  GEN: no acute distress.  Overweight, looks like she has a cold  HEENT: Atraumatic, Normocephalic. Bilateral TM wnl, oropharynx normal.  PEERL,EOMI.   Ears and Nose: No external deformity. CV: RRR, No M/G/R. No JVD. No thrill. No extra heart sounds. PULM: CTA B, no wheezes, crackles, rhonchi. No retractions. No resp. distress. No accessory muscle use. ABD: S, NT, ND, +BS. No rebound. No HSM. EXTR: No c/c/e PSYCH: Normally interactive. Conversant.   Results for orders placed or performed in visit on 05/17/20  POCT Influenza A/B  Result Value Ref Range   Influenza A, POC Negative Negative   Influenza B,  POC Negative Negative    Assessment and Plan: Fatigue, unspecified type - Plan: POCT Influenza A/B, Novel Coronavirus, NAA ([facility]), CANCELED: Novel Coronavirus, NAA ([facility])  Cough - Plan: cefdinir (OMNICEF) 300 MG capsule, predniSONE (DELTASONE) 20 MG tablet  Patient seen today with illness.  She has symptoms typical of a viral infection such as flu,  but rapid flu was negative.  Also consider bacterial infection or COVID-19, PCR test collected today. She notes that she seems to have more frequent symptoms of illness recently, wonders if this may be due to asthma.  She has been treated with inhaled steroids in the past and broke out with a rash at one point- however, this is thought to be due to concurrent use of Singulair.  She is no longer using inhaled steroids as they did not seem to be that helpful  For the time being, we will cover her with cefdinir and prednisone if she is symptomatic.  I will be in touch with her COVID-19 test.  She is asked to follow-up if getting worse or if not feeling better She is able to tolerate oral steroids and cefdinir ok per her report, but is taking both in the recent past This visit occurred during the SARS-CoV-2 public health emergency.  Safety protocols were in place, including screening questions prior to the visit, additional usage of staff PPE, and extensive cleaning of exam room while observing appropriate contact time as indicated for disinfecting solutions.    Signed Abbe Amsterdam, MD

## 2020-05-18 DIAGNOSIS — R5383 Other fatigue: Secondary | ICD-10-CM | POA: Diagnosis not present

## 2020-05-19 ENCOUNTER — Encounter: Payer: Self-pay | Admitting: Family Medicine

## 2020-05-20 LAB — SARS-COV-2, NAA 2 DAY TAT

## 2020-05-20 LAB — SPECIMEN STATUS REPORT

## 2020-05-20 LAB — NOVEL CORONAVIRUS, NAA: SARS-CoV-2, NAA: NOT DETECTED

## 2020-06-23 ENCOUNTER — Other Ambulatory Visit: Payer: Self-pay | Admitting: Family Medicine

## 2020-08-24 DIAGNOSIS — Z01419 Encounter for gynecological examination (general) (routine) without abnormal findings: Secondary | ICD-10-CM | POA: Diagnosis not present

## 2020-08-24 DIAGNOSIS — Z6832 Body mass index (BMI) 32.0-32.9, adult: Secondary | ICD-10-CM | POA: Diagnosis not present

## 2020-09-07 DIAGNOSIS — Z0000 Encounter for general adult medical examination without abnormal findings: Secondary | ICD-10-CM | POA: Diagnosis not present

## 2020-09-19 ENCOUNTER — Encounter: Payer: Self-pay | Admitting: Family Medicine

## 2020-11-09 ENCOUNTER — Other Ambulatory Visit: Payer: Self-pay

## 2020-11-09 ENCOUNTER — Ambulatory Visit (INDEPENDENT_AMBULATORY_CARE_PROVIDER_SITE_OTHER): Payer: BC Managed Care – PPO

## 2020-11-09 DIAGNOSIS — Z23 Encounter for immunization: Secondary | ICD-10-CM

## 2020-11-15 ENCOUNTER — Ambulatory Visit
Admission: EM | Admit: 2020-11-15 | Discharge: 2020-11-15 | Discharge status: Against Medical Advice | Payer: BC Managed Care – PPO

## 2020-11-15 ENCOUNTER — Other Ambulatory Visit: Payer: Self-pay

## 2020-11-26 DIAGNOSIS — Z1231 Encounter for screening mammogram for malignant neoplasm of breast: Secondary | ICD-10-CM | POA: Diagnosis not present

## 2020-12-21 IMAGING — CR DG CHEST 2V
1 series · 2 of 2 positions shown · non-contrast
Comparison: Radiograph 02/19/2018

CLINICAL DATA: Shortness of breath, history of asthma with recent
use of Spiriva

EXAM:
CHEST - 2 VIEW

[Series 1: dg chest 2 view · 0.14mm/px · 2 of 2 slices shown]
[im 1/2]
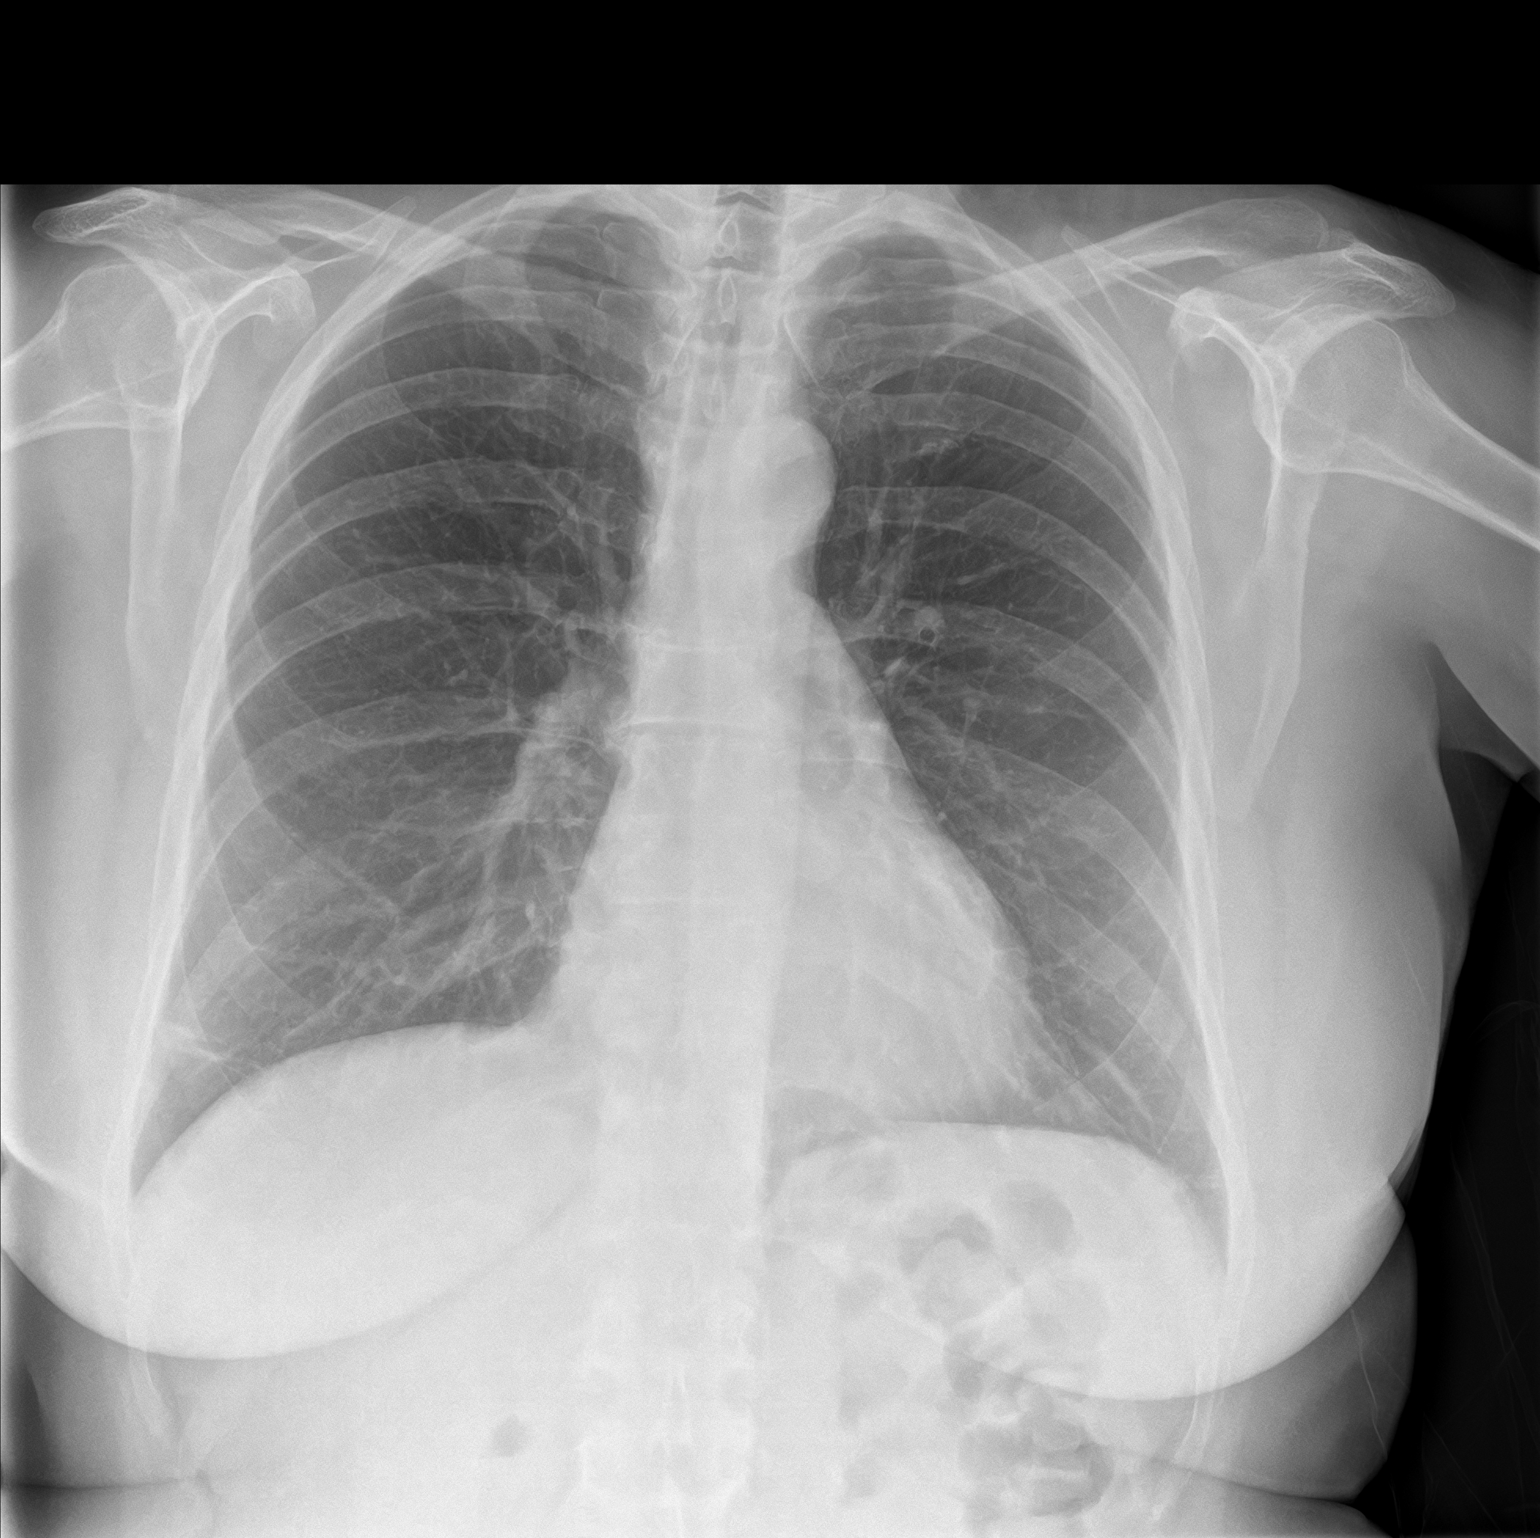
[im 2/2]
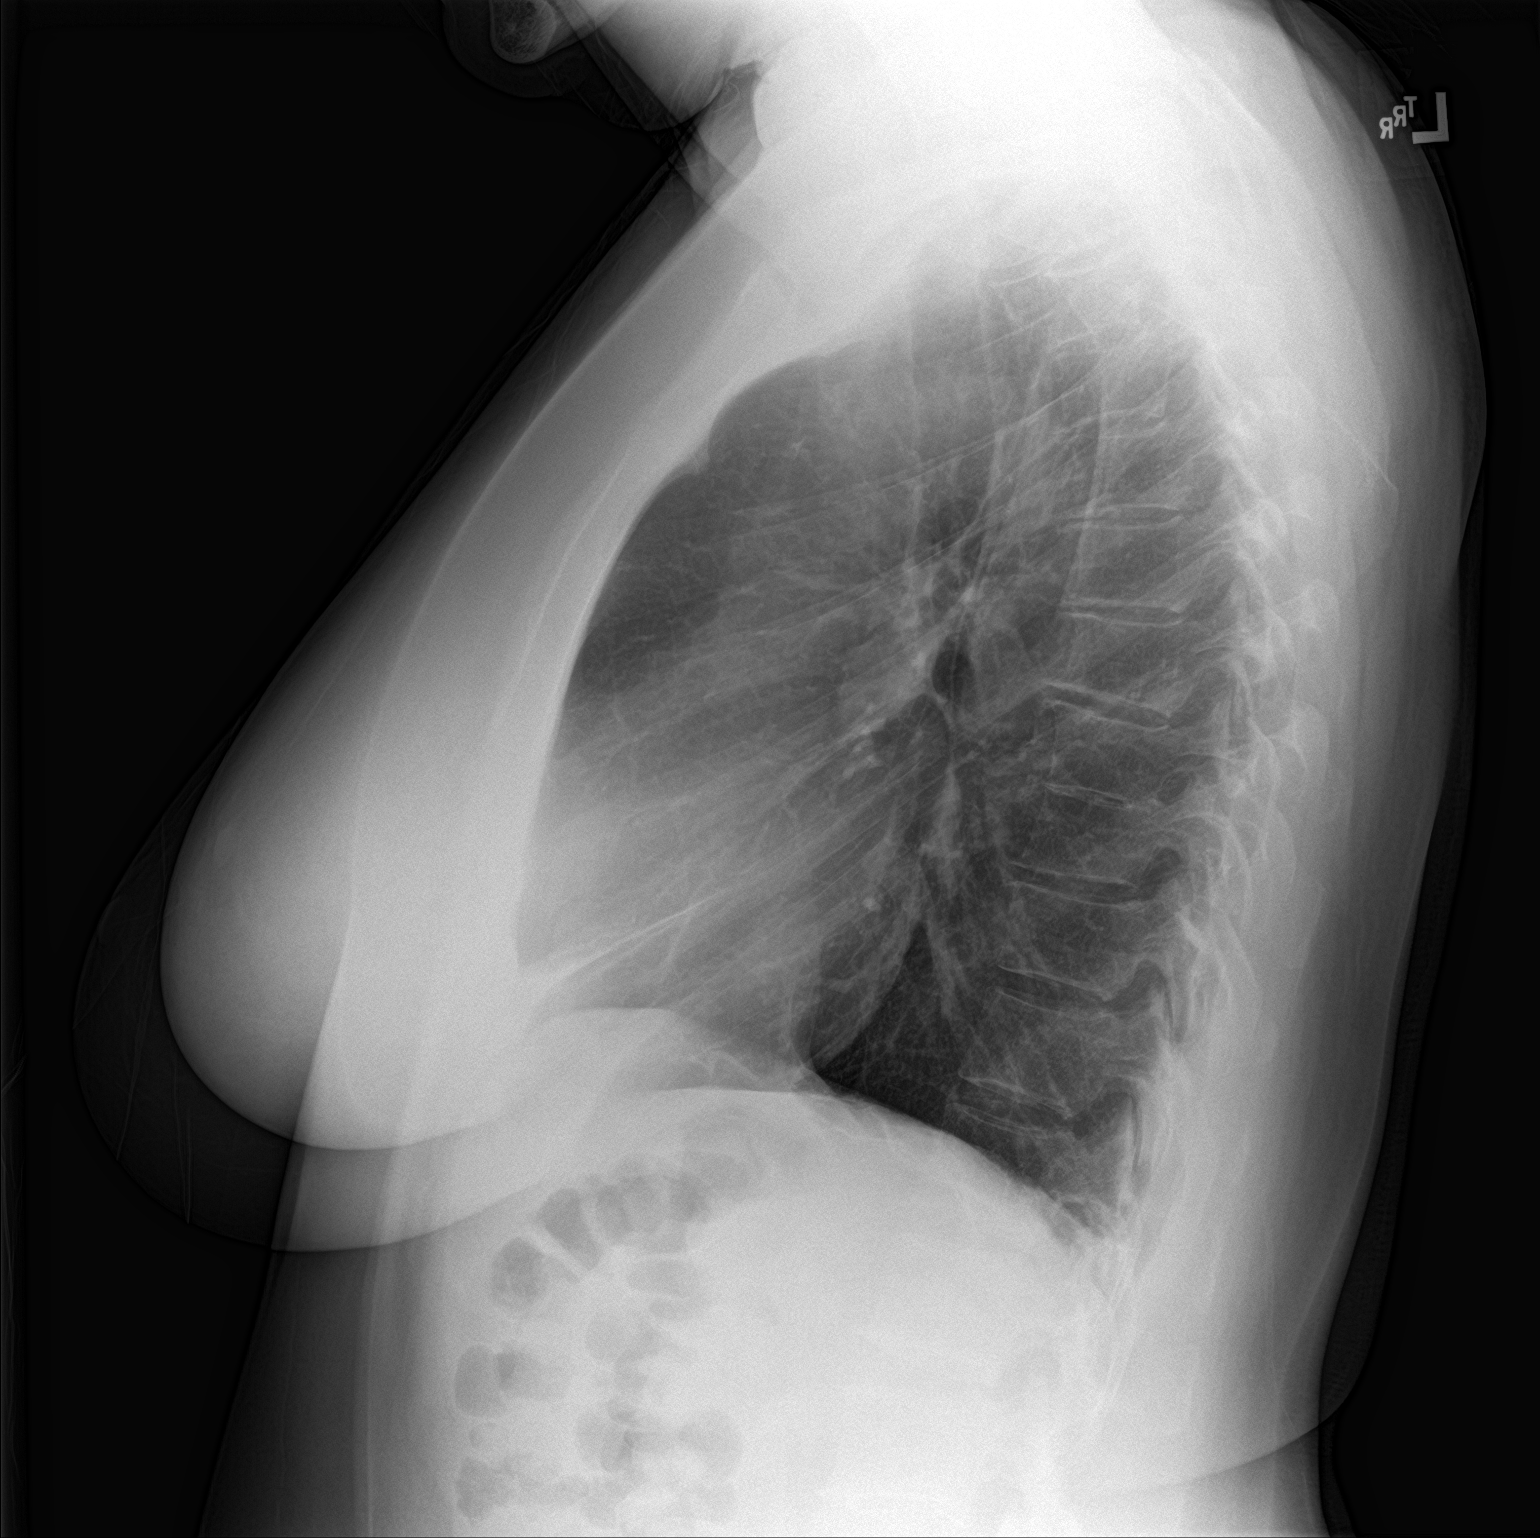

[2 of 2 positions shown; findings below may reference images not displayed]

FINDINGS: Some bandlike opacities in the periphery of the lung bases likely
reflect subsegmental atelectasis. No consolidative airspace disease
is seen. Minimal airways thickening compatible with reported history
of asthma. No pneumothorax. No effusion. The cardiomediastinal
contours are unremarkable. No acute osseous or soft tissue
abnormality.
IMPRESSION: Mild airways thickening compatible with reported history of asthma.
No consolidative airspace disease. Some bandlike opacities in the
lung bases likely reflect subsegmental atelectasis.

## 2021-01-01 ENCOUNTER — Other Ambulatory Visit: Payer: Self-pay

## 2021-01-01 ENCOUNTER — Ambulatory Visit (INDEPENDENT_AMBULATORY_CARE_PROVIDER_SITE_OTHER): Payer: BC Managed Care – PPO

## 2021-01-01 ENCOUNTER — Ambulatory Visit
Admission: EM | Admit: 2021-01-01 | Discharge: 2021-01-01 | Disposition: A | Discharge status: Home / Self Care | Payer: BC Managed Care – PPO | Attending: Physician Assistant | Admitting: Physician Assistant

## 2021-01-01 DIAGNOSIS — R059 Cough, unspecified: Secondary | ICD-10-CM

## 2021-01-01 DIAGNOSIS — J069 Acute upper respiratory infection, unspecified: Secondary | ICD-10-CM

## 2021-01-01 NOTE — ED Triage Notes (Signed)
Onset yesterday of congestion, chills and cough.  Pt feels like she wheezes intermittently when she lays down. Has been taking mucinex and sudafed. Pt wants a CXR due to h/o bronchitis and PNA.

## 2021-01-01 NOTE — ED Provider Notes (Signed)
[facility]    CSN: [id number] Arrival date & time: 01/01/21  0913      History   Chief Complaint Chief Complaint  Patient presents with   Cough    HPI Tracy Larsen is a 60 y.o. female.   Patient here today for evaluation of congestion, chills that started yesterday.  She reports she has had a mild cough as well.  She states that when she lies down she will hear noises with breathing but not necessarily wheezing.  She has tried taking Mucinex and Sudafed without significant relief.  She requests chest x-ray to rule out bronchitis and pneumonia.  She admits to having some blood-tinged sputum when she coughs it first thing in the morning but states this has been her normal at baseline.  She has seen ENT in the past for same with no explanation.  She denies any fever.  She has not had any vomiting or diarrhea.  The history is provided by the patient.  Cough Associated symptoms: no chills, no ear pain, no eye discharge, no fever, no shortness of breath, no sore throat and no wheezing    Past Medical History:  Diagnosis Date   Allergy    Arthritis    Patient denies.    Asthma    Endometriosis    GERD (gastroesophageal reflux disease)    Herpes    HSV (herpes simplex virus) infection    IBS (irritable bowel syndrome)    Insomnia    Mitral valve prolapse    UTI (urinary tract infection)    chronic history r/t bladder reflux which was repaired,    Patient Active Problem List   Diagnosis Date Noted   Vertigo 12/09/2018   Transient visual disturbance, bilateral 07/02/2015   IBS (irritable bowel syndrome) 04/22/2013   Mitral valve prolapse 09/24/2012   TIA (transient ischemic attack) 09/24/2012    Past Surgical History:  Procedure Laterality Date   BLADDER REPAIR     AGE 46   NASAL SINUS SURGERY     PELVIC LAPAROSCOPY     X 5   URETHRAL STRICTURE DILATATION      OB History     Gravida  1   Para  1   Term  1   Preterm      AB      Living   1      SAB      IAB      Ectopic      Multiple      Live Births               Home Medications    Prior to Admission medications   Medication Sig Start Date End Date Taking? Authorizing Provider  acetaminophen (TYLENOL) 325 MG tablet Take 650 mg by mouth every 6 (six) hours as needed.    [provider]  acyclovir (ZOVIRAX) 400 MG tablet Take 1 tablet (400 mg total) by mouth 2 (two) times daily. Uses as needed 12/14/17   Copland, Gay Filler, MD  cefdinir (OMNICEF) 300 MG capsule Take 1 capsule (300 mg total) by mouth 2 (two) times daily. Patient not taking: Reported on 05/17/2020 04/09/20   Wieters, Madelynn Done C, PA-C  cefdinir (OMNICEF) 300 MG capsule Take 1 capsule (300 mg total) by mouth 2 (two) times daily. 05/17/20   Copland, Gay Filler, MD  Chlorpheniramine-Acetaminophen (CORICIDIN HBP COLD/FLU PO) Take by mouth.    [provider]  estradiol (ESTRACE) 1 MG tablet Take 1  mg by mouth daily.    [provider]  guaiFENesin (MUCINEX) 600 MG 12 hr tablet Take by mouth 2 (two) times daily.    [provider]  medroxyPROGESTERone (PROVERA) 2.5 MG tablet Take 2.5 mg by mouth daily.    [provider]  omeprazole (PRILOSEC) 40 MG capsule TAKE 1 CAPSULE (40 MG TOTAL) BY MOUTH DAILY. 06/26/20   Copland, Gay Filler, MD  predniSONE (DELTASONE) 20 MG tablet Take 40 mg daily for 3 days, then 20 mg for 3 days 05/17/20   Copland, Gay Filler, MD  tiZANidine (ZANAFLEX) 4 MG tablet Take 0.5-1 tablets (2-4 mg total) by mouth every 6 (six) hours as needed for muscle spasms. 03/21/20   Wieters, Elesa Hacker, PA-C    Family History Family History  Problem Relation Age of Onset   Cancer Mother        VULVAR CANCER   Heart disease Father        MI in his 53s   Cancer Brother        SARCOMA   Heart disease Brother        WPW   Diabetes Paternal Grandmother    Colon cancer Neg Hx     Social History Social History   Tobacco Use   Smoking status: Former     Types: Cigarettes    Quit date: 01/27/2001    Years since quitting: 19.9   Smokeless tobacco: Never  Vaping Use   Vaping Use: Never used  Substance Use Topics   Alcohol use: Yes    Alcohol/week: 1.0 standard drink    Types: 1 Glasses of wine per week   Drug use: No     Allergies   Azithromycin, Breo ellipta [fluticasone furoate-vilanterol], Entex, Erythromycin, Promethazine, Stadol [butorphanol], Tamiflu [oseltamivir phosphate], Zofran [ondansetron hcl], Augmentin [amoxicillin-pot clavulanate], Ciprofloxacin, Levofloxacin, Moxifloxacin hcl in nacl, Sulfa antibiotics, and Tetracyclines & related   Review of Systems Review of Systems  Constitutional:  Negative for chills and fever.  HENT:  Positive for congestion. Negative for ear pain and sore throat.   Eyes:  Negative for discharge and redness.  Respiratory:  Positive for cough. Negative for shortness of breath and wheezing.   Gastrointestinal:  Negative for abdominal pain, diarrhea, nausea and vomiting.    Physical Exam Triage Vital Signs ED Triage Vitals  Enc Vitals Group     BP 01/01/21 1051 (!) 155/90     Pulse Rate 01/01/21 1051 67     Resp 01/01/21 1051 18     Temp 01/01/21 1051 97.7 F (36.5 C)     Temp Source 01/01/21 1051 Oral     SpO2 01/01/21 1051 98 %     Weight --      Height --      Head Circumference --      Peak Flow --      Pain Score 01/01/21 1054 0     Pain Loc --      Pain Edu? --      Excl. in [place]? --    No data found.  Updated Vital Signs BP (!) 155/90 (BP Location: Left Arm)   Pulse 67   Temp 97.7 F (36.5 C) (Oral)   Resp 18   LMP 10/15/2011   SpO2 98%      Physical Exam Vitals and nursing note reviewed.  Constitutional:      General: She is not in acute distress.    Appearance: Normal appearance. She is not ill-appearing.  HENT:  Head: Normocephalic and atraumatic.     Nose: Congestion present.     Mouth/Throat:     Mouth: Mucous membranes are moist.     Pharynx: No  oropharyngeal exudate or posterior oropharyngeal erythema.  Eyes:     Conjunctiva/sclera: Conjunctivae normal.  Cardiovascular:     Rate and Rhythm: Normal rate and regular rhythm.     Heart sounds: Normal heart sounds. No murmur heard. Pulmonary:     Effort: Pulmonary effort is normal. No respiratory distress.     Breath sounds: Normal breath sounds. No wheezing, rhonchi or rales.  Skin:    General: Skin is warm and dry.  Neurological:     Mental Status: She is alert.  Psychiatric:        Mood and Affect: Mood normal.        Thought Content: Thought content normal.     UC Treatments / Results  Labs (all labs ordered are listed, but only abnormal results are displayed) Labs Reviewed  COVID-19, FLU A+B NAA    EKG   Radiology DG Chest 2 View  Result Date: 01/01/2021 CLINICAL DATA:  Cough EXAM: CHEST - 2 VIEW COMPARISON:  02/23/2019 FINDINGS: Cardiac size is within normal limits. There are no signs of pulmonary edema or focal pulmonary consolidation. Small transverse linear densities in the lateral aspect of left lower lung fields may suggest minimal scarring or subsegmental atelectasis. There is no pleural effusion or pneumothorax. Low position of diaphragms suggests possible COPD. IMPRESSION: There are no signs of pulmonary edema or focal pulmonary consolidation. Electronically Signed   By: Ernie Avena M.D.   On: 01/01/2021 11:25    Procedures Procedures (including critical care time)  Medications Ordered in UC Medications - No data to display  Initial Impression / Assessment and Plan / UC Course  I have reviewed the triage vital signs and the nursing notes.  Pertinent labs & imaging results that were available during my care of the patient were reviewed by me and considered in my medical decision making (see chart for details).    Suspect viral etiology of symptoms. Covid and flu screening ordered. Chest x-ray clear.  Recommended follow-up with PCP regarding  elevated blood pressure in office today as patient reports this is not typical for her.  Encouraged follow-up with any further concerns.  Final Clinical Impressions(s) / UC Diagnoses   Final diagnoses:  Acute upper respiratory infection   Discharge Instructions   None    ED Prescriptions   None    PDMP not reviewed this encounter.   Tomi Bamberger, PA-C 01/01/21 1217

## 2021-01-02 ENCOUNTER — Encounter: Payer: Self-pay | Admitting: Family Medicine

## 2021-01-02 LAB — COVID-19, FLU A+B NAA
Influenza A, NAA: NOT DETECTED
Influenza B, NAA: NOT DETECTED
SARS-CoV-2, NAA: NOT DETECTED

## 2021-01-15 ENCOUNTER — Encounter: Payer: Self-pay | Admitting: Family Medicine

## 2021-01-15 DIAGNOSIS — U071 COVID-19: Secondary | ICD-10-CM

## 2021-01-16 MED ORDER — MOLNUPIRAVIR EUA 200MG CAPSULE: 4.0000 | ORAL_CAPSULE | Freq: Two times a day (BID) | ORAL | 0 refills | Status: AC

## 2021-01-19 ENCOUNTER — Encounter: Payer: Self-pay | Admitting: Family Medicine

## 2021-01-21 MED ORDER — BENZONATATE 100 MG PO CAPS: 100.0000 mg | ORAL_CAPSULE | Freq: Three times a day (TID) | ORAL | 0 refills | Status: DC | PRN

## 2021-01-21 NOTE — Addendum Note (Signed)
Addended by: Abbe Amsterdam C on: 01/21/2021 03:57 PM   Modules accepted: Orders

## 2021-01-23 ENCOUNTER — Ambulatory Visit: Payer: BC Managed Care – PPO | Admitting: Medical

## 2021-01-23 ENCOUNTER — Other Ambulatory Visit: Payer: Self-pay

## 2021-01-23 ENCOUNTER — Ambulatory Visit (HOSPITAL_BASED_OUTPATIENT_CLINIC_OR_DEPARTMENT_OTHER)
Admission: RE | Admit: 2021-01-23 | Discharge: 2021-01-23 | Disposition: A | Discharge status: Home / Self Care | Payer: BC Managed Care – PPO | Source: Ambulatory Visit | Attending: Medical | Admitting: Medical

## 2021-01-23 VITALS — BP 107/71 | HR 74 | Temp 98.2°F | Resp 16 | Ht 66.0 in | Wt 200.0 lb

## 2021-01-23 DIAGNOSIS — L739 Follicular disorder, unspecified: Secondary | ICD-10-CM

## 2021-01-23 DIAGNOSIS — L299 Pruritus, unspecified: Secondary | ICD-10-CM

## 2021-01-23 DIAGNOSIS — U071 COVID-19: Secondary | ICD-10-CM

## 2021-01-23 DIAGNOSIS — R1031 Right lower quadrant pain: Secondary | ICD-10-CM | POA: Diagnosis not present

## 2021-01-23 DIAGNOSIS — R051 Acute cough: Secondary | ICD-10-CM

## 2021-01-23 DIAGNOSIS — J0100 Acute maxillary sinusitis, unspecified: Secondary | ICD-10-CM | POA: Diagnosis not present

## 2021-01-23 DIAGNOSIS — J40 Bronchitis, not specified as acute or chronic: Secondary | ICD-10-CM

## 2021-01-23 DIAGNOSIS — R059 Cough, unspecified: Secondary | ICD-10-CM | POA: Diagnosis not present

## 2021-01-23 MED ORDER — CEFDINIR 300 MG PO CAPS: 300.0000 mg | ORAL_CAPSULE | Freq: Two times a day (BID) | ORAL | 0 refills | Status: DC

## 2021-01-23 MED ORDER — HYDROCODONE BIT-HOMATROP MBR 5-1.5 MG/5ML PO SOLN: 5.0000 mL | Freq: Three times a day (TID) | ORAL | 0 refills | Status: DC | PRN

## 2021-01-23 MED ORDER — HYDROXYZINE HCL 10 MG PO TABS: 10.0000 mg | ORAL_TABLET | Freq: Three times a day (TID) | ORAL | 0 refills | Status: DC | PRN

## 2021-01-23 NOTE — Patient Instructions (Addendum)
Covid infection with moderate symptoms, day 7 of symptoms, some sinus pressure and productive cough.  Vaccine with 1  j and j. Could not tolerate molnupiravir.  Prescribe cefdinir antibiotic as you have had that before in the past with no reaction.  Treating for possible sinus infection and bronchitis.  I do want you to get chest x-ray today.  Your oxygen is 100% and lungs do sound clear presently.  So decided not to prescribe Medrol or inhaler presently.  Some right lower quadrant pain recently reported with mild tenderness on exam.  Decided to get CBC and metabolic panel.  If you pain worsens in the right lower quadrant area let me know.  If significant worsening then advised to be seen in the emergency department for CT abdomen.  Lower back scattered folliculitis with some itching.  I want you to moisturize area twice daily.  Cefdinir antibiotic can treat folliculitis.  For itching prescribe low-dose hydroxyzine.  Rx advisement given regarding sedation side effect.  For recent severe cough prescribe Hycodan.  Rx advisement given.  You have been on hydrocodone before in the past with no reaction so feel comfortable prescribing Hycodan.  Reminder while using Hycodan would limit hydroxyzine to use only at night.  Follow-up in 7 days or sooner if needed.

## 2021-01-23 NOTE — Progress Notes (Signed)
Subjective:    Patient ID: Tracy Larsen, female    DOB: [birth year], 60 y.o.   MRN: [id number]  HPI Pt had covid onset of symptom past Tuesday.(Day of illness had phlem, tickle in throat, fever, leg aches and fatigue. Started to get sick and tested + quickly. Pt has been emailing Dr. Lorelei Pont.    Pt states he sinus pressure. Pt also stating has productive cough.    She states 2-3 weeks with intermittent mild pnd and faint st. Back at onset tested negative. Pt did take molnupiravir around onset of illness. Pt states took one dose only and med made her vomit.  Pt is now feeling better compared to onset of illness and day she had severe diarrhea. She feels like now got rehydrated.  Pt had on J and J vaccine.  Pt has been on cefnir before various times with no reaction/side effect.  Pt states hx of some sob and wheezing with illness in past.   Review of Systems  Constitutional:  Negative for chills, fatigue and fever.  HENT:  Positive for congestion, sinus pressure and sinus pain.   Respiratory:  Positive for cough. Negative for chest tightness, shortness of breath and wheezing.   Cardiovascular:  Negative for chest pain and palpitations.  Gastrointestinal:  Positive for diarrhea. Negative for abdominal pain and constipation.       On christmas eve morning she was had a lot of diarrhea, nausea and sweating. Pt had near syncope. She was evaluate ems. Pt decided not to got to ED due to fact she did not want to wait.   Genitourinary:  Negative for dyspareunia, dysuria, flank pain and hematuria.  Musculoskeletal:  Negative for back pain, joint swelling and neck pain.    Past Medical History:  Diagnosis Date   Allergy    Arthritis    Patient denies.    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    Employer: [employer]  Tobacco Use   Smoking status: Former    Types: Cigarettes    Quit date: 01/27/2001    Years since quitting: 20.0   Smokeless tobacco: Never  Vaping Use   Vaping Use: Never used  Substance and Sexual Activity   Alcohol use: Yes    Alcohol/week: 1.0 standard drink    Types: 1 Glasses of wine per week   Drug use: No   Sexual activity: Yes    Birth control/protection: None  Other Topics Concern   Not on file  Social History Narrative   Lives at home w/ husband and daughter and grandson   Right-handed   Drinks 2 glasses of tea daily   Social Determinants of Health   Financial Resource Strain: Not on file  Food Insecurity: Not on file  Transportation Needs: Not on file  Physical Activity: Not on file  Stress: Not on file  Social Connections:  Not on file  Intimate Partner Violence: Not on file    Past Surgical History:  Procedure Laterality Date   BLADDER REPAIR     AGE 79   NASAL SINUS SURGERY     PELVIC LAPAROSCOPY     X 5   URETHRAL STRICTURE DILATATION      Family History  Problem Relation Age of Onset   Cancer Mother        VULVAR CANCER   Heart disease Father        MI in his 55s   Cancer Brother        SARCOMA   Heart disease Brother        WPW   Diabetes Paternal Grandmother    Colon cancer Neg Hx     Allergies  Allergen Reactions   Azithromycin Other (See Comments)    GI Issues/IBS Exacerbation   Breo Ellipta [Fluticasone Furoate-Vilanterol] Hives   Entex Other (See Comments)    Tongue peeling    Erythromycin Diarrhea   Promethazine Other (See Comments)    Made butt feel like it was on fire --  Suppository     Stadol [Butorphanol] Other (See Comments)    dizziness   Tamiflu [Oseltamivir Phosphate] Nausea And Vomiting   Zofran [Ondansetron Hcl] Nausea Only and Other (See Comments)    Makes nausea much worse and dizziness    Augmentin [Amoxicillin-Pot Clavulanate] Other (See Comments)    GI issues/IBS Exacerbation    Ciprofloxacin Other (See Comments)    GI issues/IBS Exacerbation   Levofloxacin Other (See Comments)    GI issues/IBS Exacerbation   Moxifloxacin Hcl In Nacl Other (See Comments)    GI issues/IBS Exacerbation   Sulfa Antibiotics Rash   Tetracyclines & Related Rash    Current Outpatient Medications on File Prior to Visit  Medication Sig Dispense Refill   acetaminophen (TYLENOL) 325 MG tablet Take 650 mg by mouth every 6 (six) hours as needed.     acyclovir (ZOVIRAX) 400 MG tablet Take 1 tablet (400 mg total) by mouth 2 (two) times daily. Uses as needed 60 tablet 3   benzonatate (TESSALON) 100 MG capsule Take 1 capsule (100 mg total) by mouth 3 (three) times daily as needed for cough. 60 capsule 0   cefdinir (OMNICEF) 300 MG capsule Take 1 capsule (300 mg total) by mouth 2 (two) times daily. 14 capsule 0   cefdinir (OMNICEF) 300 MG capsule Take 1 capsule (300 mg total) by mouth 2 (two) times daily. 20 capsule 0   Chlorpheniramine-Acetaminophen (CORICIDIN HBP COLD/FLU PO) Take by mouth.     estradiol (ESTRACE) 1 MG tablet Take 1 mg by mouth daily.     guaiFENesin (MUCINEX) 600 MG 12 hr tablet Take by mouth 2 (two) times daily.     medroxyPROGESTERone (PROVERA) 2.5 MG tablet Take 2.5 mg by mouth daily.     omeprazole (PRILOSEC) 40 MG capsule TAKE 1 CAPSULE (40 MG TOTAL) BY MOUTH DAILY. 30 capsule 5   predniSONE (DELTASONE) 20 MG tablet Take 40 mg daily for 3 days, then 20 mg for 3 days 9 tablet 0   tiZANidine (ZANAFLEX) 4 MG tablet Take 0.5-1 tablets (2-4 mg total) by mouth every 6 (six) hours as needed for muscle spasms. 30 tablet 0   No current  facility-administered medications on file prior to visit.    BP 107/71 (BP Location: Left Arm, Patient Position: Sitting, Cuff Size: Small)    Pulse 74    Temp 98.2 F (36.8  C) (Oral)    Resp 16    Ht 5\' 6"  (1.676 m)    Wt 200 lb (90.7 kg)    LMP 10/15/2011    SpO2 100%    BMI 32.28 kg/m       Objective:   Physical Exam   General Mental Status- Alert. General Appearance- Not in acute distress.   Skin General: Color- Normal Color. Moisture- Normal Moisture.  Neck Carotid Arteries- Normal color. Moisture- Normal Moisture. No carotid bruits. No JVD.  Chest and Lung Exam Auscultation: Breath Sounds:-Normal.  Cardiovascular Auscultation:Rythm- Regular. Murmurs & Other Heart Sounds:Auscultation of the heart reveals- No Murmurs.  Abdomen Inspection:-Inspeection Normal. Palpation/Percussion:Note:No mass. Palpation and Percussion of the abdomen reveal- Non Tender, Non Distended + BS, no rebound or guarding. Faint- rt lower quadrant pain tender with no rebound or guarding.   Neurologic Cranial Nerve exam:- CN III-XII intact(No nystagmus), symmetric smile. Strength:- 5/5 equal and symmetric strength both upper and lower extremities.    Skin- follicles scattered and inflammed on lower back.  Back-no CVA tenderness.      Assessment & Plan:   Patient Instructions  Covid infection with moderate symptoms, day 7 of symptoms, some sinus pressure and productive cough.  Vaccine with 1  j and j. Could not tolerate molnupiravir.  Prescribe cefdinir antibiotic as you have had that before in the past with no reaction.  Treating for possible sinus infection and bronchitis.  I do want you to get chest x-ray today.  Your oxygen is 100% and lungs do sound clear presently.  So decided not to prescribe Medrol or inhaler presently.  Some right lower quadrant pain recently reported with mild tenderness on exam.  Decided to get CBC and metabolic panel.  If you pain worsens in the right lower  quadrant area let me know.  If significant worsening then advised to be seen in the emergency department for CT abdomen.  Lower back scattered folliculitis with some itching.  I want you to moisturize area twice daily.  Cefdinir antibiotic can treat folliculitis.  For itching prescribe low-dose hydroxyzine.  Rx advisement given regarding sedation side effect.  For recent severe cough prescribe Hycodan.  Rx advisement given.  You have been on hydrocodone before in the past with no reaction so feel comfortable prescribing Hycodan.  Reminder while using Hycodan would limit hydroxyzine to use only at night.  Follow-up in 7 days or sooner if needed.    10/17/2011, PA-C

## 2021-01-24 LAB — COMPREHENSIVE METABOLIC PANEL
ALT: 22 U/L (ref 0–35)
AST: 20 U/L (ref 0–37)
Albumin: 4.1 g/dL (ref 3.5–5.2)
Alkaline Phosphatase: 84 U/L (ref 39–117)
BUN: 11 mg/dL (ref 6–23)
CO2: 27 mEq/L (ref 19–32)
Calcium: 9.2 mg/dL (ref 8.4–10.5)
Chloride: 103 mEq/L (ref 96–112)
Creatinine, Ser: 0.91 mg/dL (ref 0.40–1.20)
GFR: 69.23 mL/min (ref >60.00)
Glucose, Bld: 93 mg/dL (ref 70–99)
Potassium: 4.3 mEq/L (ref 3.5–5.1)
Sodium: 139 mEq/L (ref 135–145)
Total Bilirubin: 0.4 mg/dL (ref 0.2–1.2)
Total Protein: 7.3 g/dL (ref 6.0–8.3)

## 2021-01-24 LAB — CBC WITH DIFFERENTIAL/PLATELET
Basophils Absolute: 0 10*3/uL (ref 0.0–0.1)
Basophils Relative: 0.3 % (ref 0.0–3.0)
Eosinophils Absolute: 0.1 10*3/uL (ref 0.0–0.7)
Eosinophils Relative: 1.6 % (ref 0.0–5.0)
HCT: 41.8 % (ref 36.0–46.0)
Hemoglobin: 13.9 g/dL (ref 12.0–15.0)
Lymphocytes Relative: 39.6 % (ref 12.0–46.0)
Lymphs Abs: 2.7 10*3/uL (ref 0.7–4.0)
MCHC: 33.2 g/dL (ref 30.0–36.0)
MCV: 87.9 fl (ref 78.0–100.0)
Monocytes Absolute: 0.6 10*3/uL (ref 0.1–1.0)
Monocytes Relative: 8.3 % (ref 3.0–12.0)
Neutro Abs: 3.4 10*3/uL (ref 1.4–7.7)
Neutrophils Relative %: 50.2 % (ref 43.0–77.0)
Platelets: 348 10*3/uL (ref 150.0–400.0)
RBC: 4.76 Mil/uL (ref 3.87–5.11)
RDW: 12.6 % (ref 11.5–15.5)
WBC: 6.8 10*3/uL (ref 4.0–10.5)

## 2021-01-25 ENCOUNTER — Encounter: Payer: Self-pay | Admitting: Medical

## 2021-01-28 ENCOUNTER — Encounter: Payer: Self-pay | Admitting: Medical

## 2021-01-29 ENCOUNTER — Telehealth: Payer: Self-pay | Admitting: Pulmonary Disease

## 2021-01-30 NOTE — Telephone Encounter (Signed)
Spoke with the pt. She states that she is needing ov with PFT but she would like to switch providers. She has been seen by Dr Vaughan Browner. Please advise if okay to switch. She states wanting to be seen for dyspnea post covid. Thanks.

## 2021-01-31 ENCOUNTER — Encounter: Payer: Self-pay | Admitting: Family Medicine

## 2021-01-31 DIAGNOSIS — R053 Chronic cough: Secondary | ICD-10-CM

## 2021-01-31 NOTE — Telephone Encounter (Signed)
Ok with switch  

## 2021-01-31 NOTE — Telephone Encounter (Signed)
Called patient but she did not answer. Left message for her to call back.  

## 2021-02-06 NOTE — Telephone Encounter (Signed)
Patient states as of today Dr. Maisie Fus in [place] has not received the referral that was supposed to be sent over there. Patient wanted to make sure we had their correct fax number and to make sure Attn. Lynden Ang was written on it. Fax number: [phone]. Please advice.

## 2021-02-07 ENCOUNTER — Encounter: Payer: Self-pay | Admitting: Family Medicine

## 2021-02-08 NOTE — Telephone Encounter (Signed)
Patient dropped off forms  Patient would like to be called when ready to pick up

## 2021-02-11 NOTE — Telephone Encounter (Signed)
Completed her paperwork- called pt and let her know. Will leave up front for her to pick up

## 2021-02-21 DIAGNOSIS — R042 Hemoptysis: Secondary | ICD-10-CM | POA: Diagnosis not present

## 2021-02-21 DIAGNOSIS — J988 Other specified respiratory disorders: Secondary | ICD-10-CM | POA: Diagnosis not present

## 2021-02-21 DIAGNOSIS — R053 Chronic cough: Secondary | ICD-10-CM | POA: Diagnosis not present

## 2021-02-21 DIAGNOSIS — R0602 Shortness of breath: Secondary | ICD-10-CM | POA: Diagnosis not present

## 2021-02-27 ENCOUNTER — Encounter: Payer: Self-pay | Admitting: Family Medicine

## 2021-03-05 DIAGNOSIS — R059 Cough, unspecified: Secondary | ICD-10-CM | POA: Diagnosis not present

## 2021-03-05 DIAGNOSIS — J984 Other disorders of lung: Secondary | ICD-10-CM | POA: Diagnosis not present

## 2021-03-05 DIAGNOSIS — J329 Chronic sinusitis, unspecified: Secondary | ICD-10-CM | POA: Diagnosis not present

## 2021-03-05 DIAGNOSIS — J3489 Other specified disorders of nose and nasal sinuses: Secondary | ICD-10-CM | POA: Diagnosis not present

## 2021-03-05 DIAGNOSIS — J342 Deviated nasal septum: Secondary | ICD-10-CM | POA: Diagnosis not present

## 2021-03-13 ENCOUNTER — Other Ambulatory Visit: Payer: Self-pay | Admitting: Medical

## 2021-03-14 MED ORDER — HYDROXYZINE HCL 10 MG PO TABS: 10.0000 mg | ORAL_TABLET | Freq: Three times a day (TID) | ORAL | 2 refills | Status: DC | PRN

## 2021-03-22 NOTE — Telephone Encounter (Signed)
Called and spoke with patient, she stated she had not heard back from [place] regarding switching physicians.  I advised her that we had called her and left her a message, however, we did not hear back from her.  She states she has established with another lung doctor.  Nothing further needed.  Closing encounter.  Nothing further needed.

## 2021-04-03 ENCOUNTER — Encounter: Payer: Self-pay | Admitting: Cardiology

## 2021-04-03 ENCOUNTER — Ambulatory Visit: Payer: BC Managed Care – PPO | Admitting: Cardiology

## 2021-04-03 ENCOUNTER — Other Ambulatory Visit: Payer: Self-pay

## 2021-04-03 VITALS — BP 120/71 | HR 73 | Temp 98.0°F | Resp 16 | Ht 66.0 in | Wt 207.2 lb

## 2021-04-03 DIAGNOSIS — E6609 Other obesity due to excess calories: Secondary | ICD-10-CM

## 2021-04-03 DIAGNOSIS — I341 Nonrheumatic mitral (valve) prolapse: Secondary | ICD-10-CM

## 2021-04-03 DIAGNOSIS — R0602 Shortness of breath: Secondary | ICD-10-CM

## 2021-04-03 DIAGNOSIS — Z6833 Body mass index (BMI) 33.0-33.9, adult: Secondary | ICD-10-CM | POA: Diagnosis not present

## 2021-04-03 NOTE — Progress Notes (Signed)
Primary Physician/Referring:  Pearline Cablesopland, Jessica C, MD  Patient ID: Tracy Larsen, female    DOB: 10/25/1960, 61 y.o.   MRN: [id number]  Chief Complaint  Patient presents with   Shortness of Breath   Follow-up   HPI:    Tracy Larsen  is a 61 y.o. Caucasian female patient with worsening dyspnea on exertion, ongoing for the past 2 to 3 years, and last seen on 12/09/2018, also seen by pulmonary medicine for reactive airway disease, mitral valve prolapse, chronic vertigo, IBS presents to reestablish care, previously I recommended a stress test  and an echocardiogram when I last saw her on 12/09/2018.  GXT was canceled due to COVID-19 pandemic.  She continues to complain of dyspnea on exertion.  States that she is unable to do any exercise as she gets tired easily.  She also has been working late hours.  Accompanied by her husband.  Past Medical History:  Diagnosis Date   Allergy    Arthritis    Patient denies.    Asthma    Endometriosis    GERD (gastroesophageal reflux disease)    Herpes    HSV (herpes simplex virus) infection    IBS (irritable bowel syndrome)    Insomnia    Mitral valve prolapse    UTI (urinary tract infection)    chronic history r/t bladder reflux which was repaired,   Past Surgical History:  Procedure Laterality Date   BLADDER REPAIR     AGE 20   NASAL SINUS SURGERY     PELVIC LAPAROSCOPY     X 5   URETHRAL STRICTURE DILATATION     Family History  Problem Relation Age of Onset   Cancer Mother        VULVAR CANCER   Heart disease Father        MI in his 8160s   Cancer Brother        SARCOMA   Heart disease Brother        WPW   Diabetes Paternal Grandmother    Colon cancer Neg Hx     Social History   Tobacco Use   Smoking status: Former    Packs/day: 0.25    Years: 20.00    Pack years: 5.00    Types: Cigarettes    Quit date: 01/27/2001    Years since quitting: 20.1   Smokeless tobacco: Never  Substance Use Topics   Alcohol use: Yes     Alcohol/week: 1.0 standard drink    Types: 1 Glasses of wine per week    Comment: rarely   Marital Status: Married  ROS  Review of Systems  Cardiovascular:  Positive for dyspnea on exertion. Negative for chest pain, leg swelling, orthopnea and paroxysmal nocturnal dyspnea.  Objective  Blood pressure 120/71, pulse 73, temperature 98 F (36.7 C), temperature source Temporal, resp. rate 16, height 5\' 6"  (1.676 m), weight 207 lb 3.2 oz (94 kg), last menstrual period 10/15/2011, SpO2 98 %. Body mass index is 33.44 kg/m.  Vitals with BMI 04/03/2021 01/23/2021 01/01/2021  Height 5\' 6"  5\' 6"  -  Weight 207 lbs 3 oz 200 lbs -  BMI 33.46 32.3 -  Systolic 120 107 [id number]  Diastolic 71 71 90  Pulse 73 74 67     Physical Exam Constitutional:      Appearance: She is obese.  Neck:     Vascular: No JVD.  Cardiovascular:     Rate and Rhythm: Normal rate and regular rhythm.  Pulses: Intact distal pulses.     Heart sounds: Normal heart sounds. No murmur heard.   No gallop.  Pulmonary:     Effort: Pulmonary effort is normal.     Breath sounds: Normal breath sounds.  Abdominal:     General: Bowel sounds are normal.     Palpations: Abdomen is soft.  Musculoskeletal:     Right lower leg: No edema.     Left lower leg: No edema.     Medications and allergies   Allergies  Allergen Reactions   Azithromycin Other (See Comments)    GI Issues/IBS Exacerbation   Breo Ellipta [Fluticasone Furoate-Vilanterol] Hives   Entex Other (See Comments)    Tongue peeling    Erythromycin Diarrhea   Promethazine Other (See Comments)    Made butt feel like it was on fire -- Suppository     Singulair [Montelukast] Hives   Stadol [Butorphanol] Other (See Comments)    dizziness   Tamiflu [Oseltamivir Phosphate] Nausea And Vomiting   Zofran [Ondansetron Hcl] Nausea Only and Other (See Comments)    Makes nausea much worse and dizziness    Augmentin [Amoxicillin-Pot Clavulanate] Other (See Comments)    GI  issues/IBS Exacerbation    Ciprofloxacin Other (See Comments)    GI issues/IBS Exacerbation   Levofloxacin Other (See Comments)    GI issues/IBS Exacerbation   Moxifloxacin Hcl In Nacl Other (See Comments)    GI issues/IBS Exacerbation   Sulfa Antibiotics Rash   Tetracyclines & Related Rash     Medication list after today's encounter   Current Outpatient Medications:    estradiol (ESTRACE) 1 MG tablet, Take 1 mg by mouth daily., Disp: , Rfl:    hydrOXYzine (ATARAX) 10 MG tablet, Take 1 tablet (10 mg total) by mouth 3 (three) times daily as needed for itching., Disp: 30 tablet, Rfl: 2   medroxyPROGESTERone (PROVERA) 2.5 MG tablet, Take 2.5 mg by mouth daily., Disp: , Rfl:    omeprazole (PRILOSEC) 40 MG capsule, TAKE 1 CAPSULE (40 MG TOTAL) BY MOUTH DAILY., Disp: 30 capsule, Rfl: 5  Laboratory examination:   Recent Labs    01/23/21 1541  NA 139  K 4.3  CL 103  CO2 27  GLUCOSE 93  BUN 11  CREATININE 0.91  CALCIUM 9.2   CrCl cannot be calculated (Patient's most recent lab result is older than the maximum 21 days allowed.).  CMP Latest Ref Rng & Units 01/23/2021 06/25/2019 02/23/2019  Glucose 70 - 99 mg/dL 93 77 77  BUN 6 - 23 mg/dL Creatinine 0.40 - 1.20 mg/dL 1.61 0.96(E) 4.54(U)  Sodium 135 - 145 mEq/L 139 142 142  Potassium 3.5 - 5.1 mEq/L 4.3 4.4 3.6  Chloride 96 - 112 mEq/L 103 108 109  CO2 19 - 32 mEq/L Calcium 8.4 - 10.5 mg/dL 9.2 9.8(J) 1.9(J)  Total Protein 6.0 - 8.3 g/dL 7.3 7.3 7.0  Total Bilirubin 0.2 - 1.2 mg/dL 0.4 0.4 0.5  Alkaline Phos 39 - 117 U/L 84 87 64  AST 0 - 37 U/L 20 16 11(L)  ALT 0 - 35 U/L CBC Latest Ref Rng & Units 01/23/2021 06/25/2019 02/23/2019  WBC 4.0 - 10.5 K/uL 6.8 5.4 9.5  Hemoglobin 12.0 - 15.0 g/dL 47.8 29.5 62.1  Hematocrit 36.0 - 46.0 % 41.8 40.5 39.7  Platelets 150.0 - 400.0 K/uL 348.0 232 228    Lipid Panel No results for input(s): CHOL, TRIG,  LDLCALC, VLDL, HDL, CHOLHDL, LDLDIRECT in the last  8760 hours. Lipid Panel     Component Value Date/Time   CHOL 193 12/14/2017 0906   TRIG 123.0 12/14/2017 0906   HDL 61.70 12/14/2017 0906   CHOLHDL 3 12/14/2017 0906   VLDL 24.6 12/14/2017 0906   LDLCALC 107 (H) 12/14/2017 0906     HEMOGLOBIN A1C Lab Results  Component Value Date   HGBA1C 5.8 12/14/2017   TSH No results for input(s): TSH in the last 8760 hours.   Radiology:   Reviewed CT report   Cardiac Studies:   PCV ECHOCARDIOGRAM COMPLETE 01/13/2019 Left ventricle cavity is normal in size and wall thickness. Normal LV systolic function with EF 63%. Normal global wall motion. Normal diastolic filling pattern. Calculated EF 63%. Mild myxomatous degeneration with mild (Grade I) mitral regurgitation. Inadequate TR jet to estimate pulmonary artery systolic pressure. Normal right atrial pressure.   EKG:   EKG 04/03/2021: Normal sinus rhythm at rate of 52 bpm, normal axis, incomplete right bundle branch block.  Poor R wave progression, probably normal variant.  No significant change from 12/09/2018.    Assessment     ICD-10-CM   1. Shortness of breath  R06.02 EKG 12-Lead    PCV CARDIAC STRESS TEST    2. Mitral valve prolapse  I34.1     3. Class 1 obesity due to excess calories without serious comorbidity with body mass index (BMI) of 33.0 to 33.9 in adult  E66.09    Z68.33        Medications Discontinued During This Encounter  Medication Reason   acetaminophen (TYLENOL) 325 MG tablet    acyclovir (ZOVIRAX) 400 MG tablet Completed Course   benzonatate (TESSALON) 100 MG capsule Completed Course   cefdinir (OMNICEF) 300 MG capsule Completed Course   guaiFENesin (MUCINEX) 600 MG 12 hr tablet    HYDROcodone bit-homatropine (HYCODAN) 5-1.5 MG/5ML syrup    Chlorpheniramine-Acetaminophen (CORICIDIN HBP COLD/FLU PO)    predniSONE (DELTASONE) 20 MG tablet Completed Course   tiZANidine (ZANAFLEX) 4 MG tablet     No orders of the defined types were placed in this  encounter.  Orders Placed This Encounter  Procedures   PCV CARDIAC STRESS TEST    Standing Status:   Future    Standing Expiration Date:   06/03/2021   EKG 12-Lead   Recommendations:   JOVIE SWANNER is a 61 y.o.  Caucasian female patient with worsening dyspnea on exertion, ongoing for the past 2 to 3 years, and last seen on 12/09/2018, also seen by pulmonary medicine for reactive airway disease, mitral valve prolapse, chronic vertigo, IBS presents to reestablish care, previously I recommended a stress test  and an echocardiogram when I last saw her on 12/09/2018.  GXT was canceled due to COVID-19 pandemic.  I reviewed her external labs, she has multiple questions regarding atelectasis noted on the CT scan of the chest.  I advised her that her lipids are mildly elevated probably related to her diet.  I reviewed the CT scan reports, high-resolution CT scan report and advised her that he has they are all low risk for IPF or any other significant pulmonary pathology.  Atelectasis is related to obesity and hypoventilation.  Fortunately high-resolution CT also does not reveal any coronary calcification suggestive of advanced coronary  atherosclerosis as well.  With regard to MVP, she has very mild mitral regurgitation.  No endocarditis prophylaxis is indicated, unless a murmur is heard no further repeat echocardiogram is indicated as  well.  Deconditioning has certainly played a major role in her dyspnea.  I also reviewed her pulmonary consultation notes as well.  Her husband is present and all questions answered, I also encouraged her to join wellness clinic for weight loss and exercise.  Lifestyle modification discussed.  I will set her up for a routine treadmill exercise stress test. I will personally perform the test and if I find abnormalities,  will perform further evaluation. Otherwise unless new on ongoing symptoms(patient advised to contact us), preventive  therapy is recommended. I will then  see the patient on a PRN basis.  This is a 40-minute office visit encounter.    Yates Decamp, MD, [facility] 04/03/2021, 6:24 PM Office: [phone]

## 2021-04-08 DIAGNOSIS — N3000 Acute cystitis without hematuria: Secondary | ICD-10-CM | POA: Diagnosis not present

## 2021-04-24 ENCOUNTER — Other Ambulatory Visit: Payer: Self-pay

## 2021-04-24 ENCOUNTER — Ambulatory Visit
Admission: EM | Admit: 2021-04-24 | Discharge: 2021-04-24 | Disposition: A | Discharge status: Home / Self Care | Payer: BC Managed Care – PPO | Attending: Internal Medicine | Admitting: Internal Medicine

## 2021-04-24 DIAGNOSIS — R300 Dysuria: Secondary | ICD-10-CM | POA: Insufficient documentation

## 2021-04-24 DIAGNOSIS — N3001 Acute cystitis with hematuria: Secondary | ICD-10-CM | POA: Diagnosis not present

## 2021-04-24 LAB — POCT URINALYSIS DIP (MANUAL ENTRY)
Bilirubin, UA: NEGATIVE
Glucose, UA: NEGATIVE mg/dL
Ketones, POC UA: NEGATIVE mg/dL
Nitrite, UA: NEGATIVE
Protein Ur, POC: NEGATIVE mg/dL
Spec Grav, UA: <=1.005 — AB (ref 1.010–1.025)
Urobilinogen, UA: 0.2 E.U./dL
pH, UA: 6 (ref 5.0–8.0)

## 2021-04-24 MED ORDER — CEFDINIR 300 MG PO CAPS: 300.0000 mg | ORAL_CAPSULE | Freq: Two times a day (BID) | ORAL | 0 refills | Status: AC

## 2021-04-24 NOTE — Discharge Instructions (Signed)
It appears you may have a urinary tract infection.  It is highly recommended that we do a different antibiotic other than cefdinir given that it has not worked previously and because of resistance to antibiotics is a risk.  Although, will prescribe another course since you are requesting it and have declined other antibiotics.  Urine culture is pending.  We will call if it is positive.  Please go to the hospital or follow-up with primary care doctor if symptoms persist or worsen. ?

## 2021-04-24 NOTE — ED Triage Notes (Signed)
Pt c/o dysuria, RLQ pain, urgency,  ? ?Denies frequency, oliguria, new lower back pain, hematuria,  ? ?States hx of "bladder reflux." States was seen at a different UC ~ 3 weeks ago given Cefdinir. The story becomes unclear. She was taking it at first, then stopped, then started taking it again.  ? ? ?

## 2021-04-24 NOTE — ED Provider Notes (Signed)
[name] ? ? ?CSN: [id number] ?Arrival date & time: 04/24/21  0805 ? ? ?  ? ?History   ?Chief Complaint ?Chief Complaint  ?Patient presents with  ? Dysuria  ? ? ?HPI ?Tracy Larsen is a 61 y.o. female.  ? ?Patient presents with dysuria, urinary urgency, right lower abdominal pain that started a few weeks prior.  Patient was seen by different urgent care on 04/08/2021 and was prescribed cefdinir antibiotic.  Urinalysis was clear at that visit but she was treated empirically.  Patient reports that she was notified when the urine culture returned and it was negative, so she was advised to stop cefdinir antibiotic.  She stopped taking cefdinir antibiotic about halfway through, and then symptoms returned so she completed the remainder of the dose.  This was stopped approximately 3 to 4 days ago.  Patient reports that symptoms have not completely gone away.  Denies any fever, hematuria, vaginal discharge, back pain.  Patient reports history of multiple urinary tract infections in the past where she has had to take Macrobid and Cipro daily as a preventative medication.  Also had urologic procedure as a child as well. ? ? ?Dysuria ? ?Past Medical History:  ?Diagnosis Date  ? Allergy   ? Arthritis   ? Patient denies.   ? Asthma   ? Endometriosis   ? GERD (gastroesophageal reflux disease)   ? Herpes   ? HSV (herpes simplex virus) infection   ? IBS (irritable bowel syndrome)   ? Insomnia   ? Mitral valve prolapse   ? UTI (urinary tract infection)   ? chronic history r/t bladder reflux which was repaired,  ? ? ?Patient Active Problem List  ? Diagnosis Date Noted  ? Vertigo 12/09/2018  ? Transient visual disturbance, bilateral 07/02/2015  ? IBS (irritable bowel syndrome) 04/22/2013  ? Mitral valve prolapse 09/24/2012  ? TIA (transient ischemic attack) 09/24/2012  ? ? ?Past Surgical History:  ?Procedure Laterality Date  ? BLADDER REPAIR    ? AGE 25  ? NASAL SINUS SURGERY    ? PELVIC LAPAROSCOPY    ? X 5  ?  URETHRAL STRICTURE DILATATION    ? ? ?OB History   ? ? Gravida  ?1  ? Para  ?1  ? Term  ?1  ? Preterm  ?   ? AB  ?   ? Living  ?1  ?  ? ? SAB  ?   ? IAB  ?   ? Ectopic  ?   ? Multiple  ?   ? Live Births  ?   ?   ?  ?  ? ? ? ?Home Medications   ? ?Prior to Admission medications   ?Medication Sig Start Date End Date Taking? Authorizing Provider  ?cefdinir (OMNICEF) 300 MG capsule Take 1 capsule (300 mg total) by mouth 2 (two) times daily for 10 days. 04/24/21 05/04/21 Yes Gustavus Bryant, FNP  ?estradiol (ESTRACE) 1 MG tablet Take 1 mg by mouth daily.    [provider]  ?hydrOXYzine (ATARAX) 10 MG tablet Take 1 tablet (10 mg total) by mouth 3 (three) times daily as needed for itching. 03/14/21   Copland, Gwenlyn Found, MD  ?medroxyPROGESTERone (PROVERA) 2.5 MG tablet Take 2.5 mg by mouth daily.    [provider]  ?omeprazole (PRILOSEC) 40 MG capsule TAKE 1 CAPSULE (40 MG TOTAL) BY MOUTH DAILY. 06/26/20   Copland, Gwenlyn Found, MD  ? ? ?Family History ?Family History  ?  Problem Relation Age of Onset  ? Cancer Mother   ?     VULVAR CANCER  ? Heart disease Father   ?     MI in his 82s  ? Cancer Brother   ?     SARCOMA  ? Heart disease Brother   ?     WPW  ? Diabetes Paternal Grandmother   ? Colon cancer Neg Hx   ? ? ?Social History ?Social History  ? ?Tobacco Use  ? Smoking status: Former  ?  Packs/day: 0.25  ?  Years: 20.00  ?  Pack years: 5.00  ?  Types: Cigarettes  ?  Quit date: 01/27/2001  ?  Years since quitting: 20.2  ? Smokeless tobacco: Never  ?Vaping Use  ? Vaping Use: Never used  ?Substance Use Topics  ? Alcohol use: Yes  ?  Alcohol/week: 1.0 standard drink  ?  Types: 1 Glasses of wine per week  ?  Comment: rarely  ? Drug use: No  ? ? ? ?Allergies   ?Azithromycin, Breo ellipta [fluticasone furoate-vilanterol], Entex, Erythromycin, Promethazine, Singulair [montelukast], Stadol [butorphanol], Tamiflu [oseltamivir phosphate], Zofran [ondansetron hcl], Augmentin [amoxicillin-pot clavulanate], Ciprofloxacin,  Levofloxacin, Moxifloxacin hcl in nacl, Sulfa antibiotics, and Tetracyclines & related ? ? ?Review of Systems ?Review of Systems ?Per HPI ? ?Physical Exam ?Triage Vital Signs ?ED Triage Vitals [04/24/21 0826]  ?Enc Vitals Group  ?   BP (!) 141/85  ?   Pulse Rate 86  ?   Resp 18  ?   Temp 97.7 ?F (36.5 ?C)  ?   Temp Source Oral  ?   SpO2 98 %  ?   Weight   ?   Height   ?   Head Circumference   ?   Peak Flow   ?   Pain Score 0  ?   Pain Loc   ?   Pain Edu?   ?   Excl. in GC?   ? ?No data found. ? ?Updated Vital Signs ?BP (!) 141/85 (BP Location: Left Arm)   Pulse 86   Temp 97.7 ?F (36.5 ?C) (Oral)   Resp 18   LMP 10/15/2011   SpO2 98%  ? ?Visual Acuity ?Right Eye Distance:   ?Left Eye Distance:   ?Bilateral Distance:   ? ?Right Eye Near:   ?Left Eye Near:    ?Bilateral Near:    ? ?Physical Exam ?Constitutional:   ?   General: She is not in acute distress. ?   Appearance: Normal appearance. She is not toxic-appearing or diaphoretic.  ?HENT:  ?   Head: Normocephalic and atraumatic.  ?Eyes:  ?   Extraocular Movements: Extraocular movements intact.  ?   Conjunctiva/sclera: Conjunctivae normal.  ?Cardiovascular:  ?   Rate and Rhythm: Normal rate and regular rhythm.  ?   Pulses: Normal pulses.  ?   Heart sounds: Normal heart sounds.  ?Pulmonary:  ?   Effort: Pulmonary effort is normal. No respiratory distress.  ?   Breath sounds: Normal breath sounds.  ?Abdominal:  ?   General: Bowel sounds are normal. There is no distension.  ?   Palpations: Abdomen is soft.  ?   Tenderness: There is no abdominal tenderness.  ?Neurological:  ?   General: No focal deficit present.  ?   Mental Status: She is alert and oriented to person, place, and time. Mental status is at baseline.  ?Psychiatric:     ?   Mood and Affect: Mood normal.     ?  Behavior: Behavior normal.     ?   Thought Content: Thought content normal.     ?   Judgment: Judgment normal.  ? ? ? ?UC Treatments / Results  ?Labs ?(all labs ordered are listed, but only  abnormal results are displayed) ?Labs Reviewed  ?POCT URINALYSIS DIP (MANUAL ENTRY) - Abnormal; Notable for the following components:  ?    Result Value  ? Spec Grav, UA <=1.005 (*)   ? Blood, UA moderate (*)   ? Leukocytes, UA Small (1+) (*)   ? All other components within normal limits  ?URINE CULTURE  ? ? ?EKG ? ? ?Radiology ?No results found. ? ?Procedures ?Procedures (including critical care time) ? ?Medications Ordered in UC ?Medications - No data to display ? ?Initial Impression / Assessment and Plan / UC Course  ?I have reviewed the triage vital signs and the nursing notes. ? ?Pertinent labs & imaging results that were available during my care of the patient were reviewed by me and considered in my medical decision making (see chart for details). ? ?  ? ?Urinalysis showing small amount of leukocytes.  Urine culture is pending.  Patient adamantly refused any other treatment antibiotic other than cefdinir.  Patient was advised that cefdinir was not advised given that she recently took it with no improvement and symptoms and due to risk of antibiotic resistance.  Patient still refused any other antibiotics.  Will treat with another course of 10-day cefdinir.  Discussed strict return and ER precautions.  Patient verbalized understanding and was agreeable with plan. ?Final Clinical Impressions(s) / UC Diagnoses  ? ?Final diagnoses:  ?Acute cystitis with hematuria  ?Dysuria  ? ? ? ?Discharge Instructions   ? ?  ?It appears you may have a urinary tract infection.  It is highly recommended that we do a different antibiotic other than cefdinir given that it has not worked previously and because of resistance to antibiotics is a risk.  Although, will prescribe another course since you are requesting it and have declined other antibiotics.  Urine culture is pending.  We will call if it is positive.  Please go to the hospital or follow-up with primary care doctor if symptoms persist or worsen. ? ? ? ?ED Prescriptions    ? ? Medication Sig Dispense Auth. Provider  ? cefdinir (OMNICEF) 300 MG capsule Take 1 capsule (300 mg total) by mouth 2 (two) times daily for 10 days. 20 capsule Ervin KnackMound, Pharrah Rottman E, [place]  ? ?  ? ?PDMP not reviewed this en

## 2021-04-25 LAB — URINE CULTURE: Culture: <10000 — AB

## 2021-05-10 ENCOUNTER — Ambulatory Visit: Payer: BC Managed Care – PPO

## 2021-05-10 DIAGNOSIS — R0602 Shortness of breath: Secondary | ICD-10-CM

## 2021-05-12 LAB — PCV CARDIAC STRESS TEST: Angina Index: 0

## 2021-06-14 DIAGNOSIS — M722 Plantar fascial fibromatosis: Secondary | ICD-10-CM | POA: Diagnosis not present

## 2021-06-14 DIAGNOSIS — D2122 Benign neoplasm of connective and other soft tissue of left lower limb, including hip: Secondary | ICD-10-CM | POA: Diagnosis not present

## 2021-06-14 DIAGNOSIS — M79672 Pain in left foot: Secondary | ICD-10-CM | POA: Diagnosis not present

## 2021-07-29 DIAGNOSIS — E559 Vitamin D deficiency, unspecified: Secondary | ICD-10-CM | POA: Diagnosis not present

## 2021-07-29 DIAGNOSIS — Z0000 Encounter for general adult medical examination without abnormal findings: Secondary | ICD-10-CM | POA: Diagnosis not present

## 2021-07-29 LAB — LIPID PANEL
Cholesterol: 172 (ref 0–200)
HDL: 55 (ref 35–70)
LDL Cholesterol: 100
Triglycerides: 90 (ref 40–160)

## 2021-07-29 LAB — HEPATIC FUNCTION PANEL
ALT: 7 U/L (ref 7–35)
AST: 11 — AB (ref 13–35)
Alkaline Phosphatase: 114 (ref 25–125)
Bilirubin, Total: 0.2

## 2021-07-29 LAB — CBC AND DIFFERENTIAL
HCT: 41 (ref 36–46)
Hemoglobin: 13.5 (ref 12.0–16.0)
Neutrophils Absolute: 2.4
Platelets: 257 10*3/uL (ref 150–400)
WBC: 5.2

## 2021-07-29 LAB — BASIC METABOLIC PANEL
BUN: 17 (ref 4–21)
CO2: 21 (ref 13–22)
Chloride: 106 (ref 99–108)
Creatinine: 1 (ref 0.5–1.1)
Glucose: 90
Potassium: 4.8 mEq/L (ref 3.5–5.1)
Sodium: 142 (ref 137–147)

## 2021-07-29 LAB — TSH: TSH: 2.75 (ref 0.41–5.90)

## 2021-07-29 LAB — CBC: RBC: 4.62 (ref 3.87–5.11)

## 2021-07-29 LAB — COMPREHENSIVE METABOLIC PANEL
Albumin: 4.1 (ref 3.5–5.0)
Calcium: 8.9 (ref 8.7–10.7)
Globulin: 2.8
eGFR: 66

## 2021-07-29 LAB — HEMOGLOBIN A1C: Hemoglobin A1C: 5.9

## 2021-07-30 ENCOUNTER — Other Ambulatory Visit: Payer: Self-pay | Admitting: Family Medicine

## 2021-11-04 ENCOUNTER — Encounter: Payer: Self-pay | Admitting: Family Medicine

## 2021-11-04 ENCOUNTER — Ambulatory Visit: Payer: BC Managed Care – PPO | Admitting: Family Medicine

## 2021-11-04 VITALS — BP 120/87 | HR 84 | Temp 98.5°F | Resp 12 | Ht 66.0 in | Wt 201.5 lb

## 2021-11-04 DIAGNOSIS — R197 Diarrhea, unspecified: Secondary | ICD-10-CM

## 2021-11-04 DIAGNOSIS — M545 Low back pain, unspecified: Secondary | ICD-10-CM | POA: Diagnosis not present

## 2021-11-04 DIAGNOSIS — R1031 Right lower quadrant pain: Secondary | ICD-10-CM | POA: Diagnosis not present

## 2021-11-04 LAB — COMPREHENSIVE METABOLIC PANEL
ALT: 7 U/L (ref 0–35)
AST: 10 U/L (ref 0–37)
Albumin: 4.1 g/dL (ref 3.5–5.2)
Alkaline Phosphatase: 91 U/L (ref 39–117)
BUN: 15 mg/dL (ref 6–23)
CO2: 27 mEq/L (ref 19–32)
Calcium: 9.3 mg/dL (ref 8.4–10.5)
Chloride: 104 mEq/L (ref 96–112)
Creatinine, Ser: 0.92 mg/dL (ref 0.40–1.20)
GFR: 67.95 mL/min (ref >60.00)
Glucose, Bld: 140 mg/dL — ABNORMAL HIGH (ref 70–99)
Potassium: 3.8 mEq/L (ref 3.5–5.1)
Sodium: 139 mEq/L (ref 135–145)
Total Bilirubin: 0.3 mg/dL (ref 0.2–1.2)
Total Protein: 7.5 g/dL (ref 6.0–8.3)

## 2021-11-04 LAB — CBC WITH DIFFERENTIAL/PLATELET
Basophils Absolute: 0.1 10*3/uL (ref 0.0–0.1)
Basophils Relative: 0.8 % (ref 0.0–3.0)
Eosinophils Absolute: 0.1 10*3/uL (ref 0.0–0.7)
Eosinophils Relative: 1.8 % (ref 0.0–5.0)
HCT: 41.4 % (ref 36.0–46.0)
Hemoglobin: 14 g/dL (ref 12.0–15.0)
Lymphocytes Relative: 34.4 % (ref 12.0–46.0)
Lymphs Abs: 2.3 10*3/uL (ref 0.7–4.0)
MCHC: 33.9 g/dL (ref 30.0–36.0)
MCV: 89.1 fl (ref 78.0–100.0)
Monocytes Absolute: 0.4 10*3/uL (ref 0.1–1.0)
Monocytes Relative: 6.2 % (ref 3.0–12.0)
Neutro Abs: 3.8 10*3/uL (ref 1.4–7.7)
Neutrophils Relative %: 56.8 % (ref 43.0–77.0)
Platelets: 239 10*3/uL (ref 150.0–400.0)
RBC: 4.64 Mil/uL (ref 3.87–5.11)
RDW: 12.8 % (ref 11.5–15.5)
WBC: 6.6 10*3/uL (ref 4.0–10.5)

## 2021-11-04 LAB — URINALYSIS, ROUTINE W REFLEX MICROSCOPIC
Bilirubin Urine: NEGATIVE
Ketones, ur: NEGATIVE
Nitrite: NEGATIVE
Specific Gravity, Urine: 1.01 (ref 1.000–1.030)
Total Protein, Urine: NEGATIVE
Urine Glucose: NEGATIVE
Urobilinogen, UA: 0.2 (ref 0.0–1.0)
pH: 6 (ref 5.0–8.0)

## 2021-11-04 NOTE — Progress Notes (Signed)
ACUTE VISIT Chief Complaint  Patient presents with   Flank Pain    From right side of groin to back for weeks.   HPI: [name] is a 61 y.o. female with medical history significant for IBS, GERD,mitral valve prolapse, and back pain here today with her husband complaining of RLQ and back pain. Pain started about 3 weeks ago, initially radiated to RLQ and groin pain. She has not noted masses or bulged areas. Pain was worse this past weekend, associated nausea and diarrhea for a couple hours. She is concerned about possible diverticulitis or bowel obstruction. Pain has improved, now 4/10. Back pain exacerbated by certain movements and RLQ + nausea exacerbated  by food intake.  Abdominal Pain This is a new problem. The current episode started 1 to 4 weeks ago. The problem occurs constantly. The problem has been gradually improving. The pain is located in the RLQ. The pain is at a severity of 7/10. The pain is moderate. The quality of the pain is aching. The abdominal pain radiates to the right flank. Associated symptoms include diarrhea, flatus and nausea. Pertinent negatives include no anorexia, belching, dysuria, fever, frequency, headaches, hematochezia, hematuria, melena or vomiting. The pain is aggravated by certain positions and eating. The pain is relieved by Nothing. She has tried nothing for the symptoms. Her past medical history is significant for abdominal surgery (Laparoscopic surgeries.) and GERD.  Negative for sick contact, suspicious food intake,or recent travel. IBS with constipation and diarrhea but recent episodes of diarrhea seemed different than her usual, it was "thin and light", negative for blood or mucus. She has not tried OTC medications. Negative for saddle anesthesia, LE numbness/tingling,or bladder/bowel dysfunction.  Diarrhea has resolved, she now feels constipated. Last bowel movement Saturday. + Passing gas. Negative for heartburn. Negative for  urinary symptoms. Reporting hx of microscopic hematuria.  Abdominal/pelvic CT in 05/2019 done because RLQ abdominal pain:No bowel wall thickening or bowel obstruction. Note that there is lipomatous infiltration in the ileocecal valve. This finding has been associated with intermittent right lower quadrant pain. There are sigmoid diverticula without diverticulitis evident.Normal appendix.  Review of Systems  Constitutional:  Positive for activity change, appetite change and fatigue. Negative for chills and fever.  HENT:  Negative for sore throat and trouble swallowing.   Respiratory:  Negative for cough, shortness of breath and wheezing.   Cardiovascular:  Negative for chest pain and palpitations.  Gastrointestinal:  Positive for abdominal pain, diarrhea, flatus and nausea. Negative for anorexia, hematochezia, melena and vomiting.  Genitourinary:  Negative for dysuria, frequency, hematuria, vaginal bleeding and vaginal discharge.  Musculoskeletal:  Positive for back pain.  Skin:  Negative for rash.  Neurological:  Negative for syncope and headaches.  Rest see pertinent positives and negatives per HPI.  Current Outpatient Medications on File Prior to Visit  Medication Sig Dispense Refill   estradiol (ESTRACE) 1 MG tablet Take 1 mg by mouth daily.     hydrOXYzine (ATARAX) 10 MG tablet Take 1 tablet (10 mg total) by mouth 3 (three) times daily as needed for itching. 30 tablet 2   medroxyPROGESTERone (PROVERA) 2.5 MG tablet Take 2.5 mg by mouth daily.     omeprazole (PRILOSEC) 40 MG capsule TAKE 1 CAPSULE BY MOUTH EVERY DAY 30 capsule 5   No current facility-administered medications on file prior to visit.   Past Medical History:  Diagnosis Date   Allergy    Arthritis    Patient denies.    Asthma  Endometriosis    GERD (gastroesophageal reflux disease)    Herpes    HSV (herpes simplex virus) infection    IBS (irritable bowel syndrome)    Insomnia    Mitral valve prolapse    UTI  (urinary tract infection)    chronic history r/t bladder reflux which was repaired,   Allergies  Allergen Reactions   Azithromycin Other (See Comments)    GI Issues/IBS Exacerbation   Breo Ellipta [Fluticasone Furoate-Vilanterol] Hives   Entex Other (See Comments)    Tongue peeling    Erythromycin Diarrhea   Promethazine Other (See Comments)    Made butt feel like it was on fire -- Suppository     Singulair [Montelukast] Hives   Stadol [Butorphanol] Other (See Comments)    dizziness   Tamiflu [Oseltamivir Phosphate] Nausea And Vomiting   Zofran [Ondansetron Hcl] Nausea Only and Other (See Comments)    Makes nausea much worse and dizziness    Augmentin [Amoxicillin-Pot Clavulanate] Other (See Comments)    GI issues/IBS Exacerbation    Ciprofloxacin Other (See Comments)    GI issues/IBS Exacerbation   Levofloxacin Other (See Comments)    GI issues/IBS Exacerbation   Moxifloxacin Hcl In Nacl Other (See Comments)    GI issues/IBS Exacerbation   Sulfa Antibiotics Rash   Tetracyclines & Related Rash    Social History   Socioeconomic History   Marital status: Married    Spouse name: Not on file   Number of children: 1   Years of education: Not on file   Highest education level: Not on file  Occupational History   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    Employer: [employer]  Tobacco Use   Smoking status: Former    Packs/day: 0.25    Years: 20.00    Total pack years: 5.00    Types: Cigarettes    Quit date: 01/27/2001    Years since quitting: 20.7   Smokeless tobacco: Never  Vaping Use   Vaping Use: Never used  Substance and Sexual Activity   Alcohol use: Yes    Alcohol/week: 1.0 standard drink of alcohol    Types: 1 Glasses of wine per week    Comment: rarely   Drug use: No   Sexual activity: Yes    Birth control/protection: None  Other Topics Concern   Not on file  Social History Narrative   Lives at home w/ husband and daughter and grandson    Right-handed   Drinks 2 glasses of tea daily   Social Determinants of Health   Financial Resource Strain: Not on file  Food Insecurity: Not on file  Transportation Needs: Not on file  Physical Activity: Not on file  Stress: Not on file  Social Connections: Not on file   Vitals:   11/04/21 1410  BP: 120/87  Pulse: 84  Resp: 12  Temp: 98.5 F (36.9 C)  SpO2: 99%  Body mass index is 32.52 kg/m.  Physical Exam Vitals and nursing note reviewed.  Constitutional:      General: She is not in acute distress.    Appearance: She is well-developed. She is not ill-appearing.  HENT:     Head: Normocephalic and atraumatic.     Mouth/Throat:     Mouth: Mucous membranes are moist.  Eyes:     General: No scleral icterus.    Conjunctiva/sclera: Conjunctivae normal.  Cardiovascular:     Rate and Rhythm: Normal rate and regular rhythm.     Heart sounds: No murmur  heard. Pulmonary:     Effort: Pulmonary effort is normal. No respiratory distress.     Breath sounds: Normal breath sounds.  Abdominal:     General: Bowel sounds are normal. There is no distension or abdominal bruit.     Palpations: Abdomen is soft. There is no hepatomegaly or mass.     Tenderness: There is abdominal tenderness in the right lower quadrant. There is no right CVA tenderness, left CVA tenderness, guarding or rebound. Negative signs include Rovsing's sign and psoas sign.  Musculoskeletal:     Lumbar back: Tenderness present. No bony tenderness. Negative right straight leg raise test and negative left straight leg raise test.       Back:  Skin:    General: Skin is warm.     Findings: No erythema or rash.  Neurological:     Mental Status: She is alert and oriented to person, place, and time.  Psychiatric:        Mood and Affect: Mood is anxious.   ASSESSMENT AND PLAN:  [name] was seen today for flank pain.  Diagnoses and all orders for this visit: Orders Placed This Encounter  Procedures   CBC with  Differential/Platelet   Comprehensive metabolic panel   Urinalysis, Routine w reflex microscopic   Lab Results  Component Value Date   WBC 6.6 11/04/2021   HGB 14.0 11/04/2021   HCT 41.4 11/04/2021   MCV 89.1 11/04/2021   PLT 239.0 11/04/2021   Lab Results  Component Value Date   CREATININE 0.92 11/04/2021   BUN 15 11/04/2021   NA 139 11/04/2021   K 3.8 11/04/2021   CL 104 11/04/2021   CO2 27 11/04/2021   Lab Results  Component Value Date   ALT 7 11/04/2021   AST 10 11/04/2021   ALKPHOS 91 11/04/2021   BILITOT 0.3 11/04/2021   Right low back pain, unspecified chronicity, unspecified whether sciatica present Hx of lower back pain. Seems musculoskeletal pain. I do not think imaging is needed today. Monitor for new symptoms.  RLQ abdominal pain We discussed possible etiologies. Examination today and hx do not suggest a serious process like obstruction or serious infectious process. I do not have imaging services today but we could have it done at [facility] may help to identified possible kidney stones. We also discussed the option of going to the ED if she was concerned about obstruction, there stat labs and abdominal CT can be performed if appropriate. She prefers to hold on imaging, will go home and continue monitoring for new symptoms. Liquid diet for 24 hours, adequate hydration,and rest. Tomorrow she can start advancing diet as tolerated. For nausea small sips/bite at the time. Zofran and Phenergan are on her allergy med list, intolerance. Clearly instructed about warning signs.  Diarrhea, unspecified type Resolved. Could be part of her IBS. Other possible causes discussed. Continue adequate hydration.  Return in about 1 week (around 11/11/2021).  Tracy Larsen G. Martinique, MD  [facility] [place] office.

## 2021-11-04 NOTE — Patient Instructions (Signed)
A few things to remember from today's visit:  Right low back pain, unspecified chronicity, unspecified whether sciatica present  RLQ abdominal pain - Plan: CBC with Differential/Platelet, Comprehensive metabolic panel, Urinalysis, Routine w reflex microscopic  Irritable bowel syndrome with both constipation and diarrhea  Symptoms could be caused by IBS exacerbation, viral colitis. Kidney stone is also in the differential.  At this time history and examination is not suggestive of a serious process. We do not have imaging available in the office. Labs done today will be back tonight or tomorrow morning. If pain gets worse, you develop fever, start vomiting, or you can not pass gas please seek immediate medical attention. Please follow with Dr Lorelei Pont if pain has not resolved in a week.  If you need refills for medications you take chronically, please call your pharmacy. Do not use My Chart to request refills or for acute issues that need immediate attention. If you send a my chart message, it may take a few days to be addressed, specially if I am not in the office.  Please be sure medication list is accurate. If a new problem present, please set up appointment sooner than planned today.

## 2021-11-20 DIAGNOSIS — M5441 Lumbago with sciatica, right side: Secondary | ICD-10-CM | POA: Diagnosis not present

## 2021-11-20 DIAGNOSIS — R1031 Right lower quadrant pain: Secondary | ICD-10-CM | POA: Diagnosis not present

## 2021-11-21 NOTE — Progress Notes (Deleted)
Patient here for flu vaccine.  Vaccine given in left deltoid and patient tolerated well. 

## 2021-11-22 ENCOUNTER — Encounter: Payer: Self-pay | Admitting: Medical

## 2021-11-22 ENCOUNTER — Ambulatory Visit: Payer: BC Managed Care – PPO

## 2021-11-22 ENCOUNTER — Ambulatory Visit: Payer: BC Managed Care – PPO | Admitting: Medical

## 2021-11-22 VITALS — BP 138/78 | HR 60 | Temp 98.0°F | Resp 18 | Ht 66.0 in | Wt 205.2 lb

## 2021-11-22 DIAGNOSIS — R1031 Right lower quadrant pain: Secondary | ICD-10-CM | POA: Diagnosis not present

## 2021-11-22 DIAGNOSIS — M25551 Pain in right hip: Secondary | ICD-10-CM

## 2021-11-22 DIAGNOSIS — Z23 Encounter for immunization: Secondary | ICD-10-CM | POA: Diagnosis not present

## 2021-11-22 DIAGNOSIS — M545 Low back pain, unspecified: Secondary | ICD-10-CM | POA: Diagnosis not present

## 2021-11-22 MED ORDER — METHYLPREDNISOLONE 4 MG PO TABS: ORAL_TABLET | ORAL | 0 refills | Status: DC

## 2021-11-22 MED ORDER — KETOROLAC TROMETHAMINE 30 MG/ML IJ SOLN
30.0000 mg | Freq: Once | INTRAMUSCULAR | Status: AC
  Administered 2021-11-22: 30 mg via INTRAMUSCULAR

## 2021-11-22 NOTE — Progress Notes (Deleted)
[facility] [address] [place], [place] [address] [phone]  Date:  12/02/2021   Name:  Tracy Larsen   DOB:  [birth year]   MRN:  [id number]  PCP:  Pearline Cables, MD    Chief Complaint: No chief complaint on file.   History of Present Illness:  Tracy Larsen is a 61 y.o. very pleasant female patient who presents with the following:  Patient seen today with concern of pelvic pain Most recent visit with myself was in April 2022 History of TIA about 10 years ago, IBS, mitral valve prolapse She does follow-up with cardiology, seen by Dr. Jacinto Halim this past March At cardiology visit she had complained of dyspnea on exertion She was seen by Dr. [place] with [facility] on 10/9 with complaint of right lower quadrant and back pain  Tdap Shingrix Mammogram Recommend COVID booster Pap smear Flu shot  She did have a CMP and CBC earlier this month, lipids/A1c/TSH checked in July Patient Active Problem List   Diagnosis Date Noted   Vertigo 12/09/2018   Transient visual disturbance, bilateral 07/02/2015   IBS (irritable bowel syndrome) 04/22/2013   Mitral valve prolapse 09/24/2012   TIA (transient ischemic attack) 09/24/2012    Past Medical History:  Diagnosis Date   Allergy    Arthritis    Patient denies.    Asthma    Endometriosis    GERD (gastroesophageal reflux disease)    Herpes    HSV (herpes simplex virus) infection    IBS (irritable bowel syndrome)    Insomnia    Mitral valve prolapse    UTI (urinary tract infection)    chronic history r/t bladder reflux which was repaired,    Past Surgical History:  Procedure Laterality Date   BLADDER REPAIR     AGE 38   NASAL SINUS SURGERY     PELVIC LAPAROSCOPY     X 5   URETHRAL STRICTURE DILATATION      Social History   Tobacco Use   Smoking status: Former    Packs/day: 0.25    Years: 20.00    Total pack years: 5.00    Types:  Cigarettes    Quit date: 01/27/2001    Years since quitting: 20.8   Smokeless tobacco: Never  Vaping Use   Vaping Use: Never used  Substance Use Topics   Alcohol use: Yes    Alcohol/week: 1.0 standard drink of alcohol    Types: 1 Glasses of wine per week    Comment: rarely   Drug use: No    Family History  Problem Relation Age of Onset   Cancer Mother        VULVAR CANCER   Heart disease Father        MI in his 84s   Cancer Brother        SARCOMA   Heart disease Brother        WPW   Diabetes Paternal Grandmother    Colon cancer Neg Hx     Allergies  Allergen Reactions   Azithromycin Other (See Comments)    GI Issues/IBS Exacerbation   Breo Ellipta [Fluticasone Furoate-Vilanterol] Hives   Entex Other (See Comments)    Tongue peeling    Erythromycin Diarrhea   Promethazine Other (See Comments)    Made butt feel like it was on fire -- Suppository     Singulair [Montelukast] Hives   Stadol [Butorphanol]  Other (See Comments)    dizziness   Tamiflu [Oseltamivir Phosphate] Nausea And Vomiting   Zofran [Ondansetron Hcl] Nausea Only and Other (See Comments)    Makes nausea much worse and dizziness    Augmentin [Amoxicillin-Pot Clavulanate] Other (See Comments)    GI issues/IBS Exacerbation    Ciprofloxacin Other (See Comments)    GI issues/IBS Exacerbation   Levofloxacin Other (See Comments)    GI issues/IBS Exacerbation   Moxifloxacin Hcl In Nacl Other (See Comments)    GI issues/IBS Exacerbation   Sulfa Antibiotics Rash   Tetracyclines & Related Rash    Medication list has been reviewed and updated.  Current Outpatient Medications on File Prior to Visit  Medication Sig Dispense Refill   estradiol (ESTRACE) 1 MG tablet Take 1 mg by mouth daily.     hydrOXYzine (ATARAX) 10 MG tablet Take 1 tablet (10 mg total) by mouth 3 (three) times daily as needed for itching. 30 tablet 2   medroxyPROGESTERone (PROVERA) 2.5 MG tablet Take 2.5 mg by mouth daily.     omeprazole  (PRILOSEC) 40 MG capsule TAKE 1 CAPSULE BY MOUTH EVERY DAY 30 capsule 5   No current facility-administered medications on file prior to visit.    Review of Systems:  As per HPI- otherwise negative.   Physical Examination: There were no vitals filed for this visit. There were no vitals filed for this visit. There is no height or weight on file to calculate BMI. Ideal Body Weight:    GEN: no acute distress. HEENT: Atraumatic, Normocephalic.  Ears and Nose: No external deformity. CV: RRR, No M/G/R. No JVD. No thrill. No extra heart sounds. PULM: CTA B, no wheezes, crackles, rhonchi. No retractions. No resp. distress. No accessory muscle use. ABD: S, NT, ND, +BS. No rebound. No HSM. EXTR: No c/c/e PSYCH: Normally interactive. Conversant.    Assessment and Plan: ***  Signed Lamar Blinks, MD

## 2021-11-22 NOTE — Patient Instructions (Incomplete)
It was good to see you again today!   

## 2021-11-22 NOTE — Patient Instructions (Addendum)
Rt hip pain with with some lumbar back pain.  Patient has been seen twice for this and have reviewed available records.  Unfortunately I do not have x-ray report of right hip, lumbar or abdomen xray.  Based on patient's clinical response to steroid injection I suspected that pain is musculoskeletal.  Patient was told by urgent care that initial read of hip showed arthritis.  The degree of arthritis was not explained.  We called over to the urgent care today asking for over read from the radiologist and that was not available.  Earliest available would be next week.  I did asked patient to dated sign the release form so we could fax that over to the urgent care.  I discussed with patient that I do not think abdomen pain causing her hip region pain.  WBC was not elevated recently.  Overall good metabolic panel.  Will give toradol 30 mg IM.  Start medrol 4 mg 6 day taper dose.  Recommend not to use muscle relaxant as that seemed excessively sedating and he reported dropped her blood pressure.  Due to history of medication side effects would also avoid/not use tramadol.  Refer to sports med. I think if you call on Monday they can get you in quickly next week.   Give me update on Monday morning how you are doing.   Sign release form so we can fax form to UC to get overead reports. Ask you to pick up disc as well.  If pain increases over the weekend then be seen in UC cone or ED.  Patient did indicate that she thinks she will be able to take off of work until possibly next Friday or even the following week since work is extremely busy and they are understaffed.  I encouraged her to be seen as soon as possible and that I would write her a work excuse note is needed.  Patient expressed understanding.  Follow up sport med or as regularly scheduled with pcp or as prn  Mackie Pai, PA-C

## 2021-11-22 NOTE — Progress Notes (Signed)
Subjective:    Patient ID: Tracy Larsen, female    DOB: [birth year], 61 y.o.   MRN: [id number]  HPI   Pt in with some rt side lower upper groin area pain. Pt states for week had some rt side lower back region pain/si area. Eventually pain started to get worse.  Pt states she saw one MD  advised to do clear liquids. No imaging studies done.  Pt states 2 days ago she got steroid injection. Pain all improved.   The urgent care is asking for over read that is not back yet.    2 nights ago she went UC at [facility]. She got steroid injection, tinazadine and tramadol. Treated for MSK pain. Today UC told pt iniitial read arthritis change. In UC they told he she had a lot stool. Last bm 2 days ago.   Pt states she first hear pop to rt hip one year ago. Over the yeat at times would turn her rt foot inward to take less pressure off hip.   Pt seen by Dr. [place]    "RLQ abdominal pain We discussed possible etiologies. Examination today and hx do not suggest a serious process like obstruction or serious infectious process. I do not have imaging services today but we could have it done at [facility], KUB may help to identified possible kidney stones. We also discussed the option of going to the ED if she was concerned about obstruction, there stat labs and abdominal CT can be performed if appropriate. She prefers to hold on imaging, will go home and continue monitoring for new symptoms. Liquid diet for 24 hours, adequate hydration,and rest. Tomorrow she can start advancing diet as tolerated. For nausea small sips/bite at the time. Zofran and Phenergan are on her allergy med list, intolerance. Clearly instructed about warning signs."     Review of Systems  Constitutional:  Negative for chills, fatigue and fever.  Respiratory:  Negative for cough, chest tightness and wheezing.   Cardiovascular:  Negative for chest pain.  Gastrointestinal:  Positive for abdominal pain. Negative for nausea and  vomiting.  Genitourinary:  Negative for dysuria and enuresis.  Musculoskeletal:  Positive for back pain.       Rt hip pain  Skin:  Negative for rash and wound.  Neurological:  Negative for dizziness, numbness and headaches.  Hematological:  Negative for adenopathy. Does not bruise/bleed easily.  Psychiatric/Behavioral:  Negative for behavioral problems.     Past Medical History:  Diagnosis Date   Allergy    Arthritis    Patient denies.    Asthma    Endometriosis    GERD (gastroesophageal reflux disease)    Herpes    HSV (herpes simplex virus) infection    IBS (irritable bowel syndrome)    Insomnia    Mitral valve prolapse    UTI (urinary tract infection)    chronic history r/t bladder reflux which was repaired,     Social History   Socioeconomic History   Marital status: Married    Spouse name: Not on file   Number of children: 1   Years of education: Not on file   Highest education level: Not on file  Occupational History   Occupation: OTHTHALMIC TECHNICIAN    Employer: [employer]  Tobacco Use   Smoking status: Former    Packs/day: 0.25    Years: 20.00    Total pack years: 5.00    Types: Cigarettes    Quit date: 01/27/2001  Years since quitting: 20.8   Smokeless tobacco: Never  Vaping Use   Vaping Use: Never used  Substance and Sexual Activity   Alcohol use: Yes    Alcohol/week: 1.0 standard drink of alcohol    Types: 1 Glasses of wine per week    Comment: rarely   Drug use: No   Sexual activity: Yes    Birth control/protection: None  Other Topics Concern   Not on file  Social History Narrative   Lives at home w/ husband and daughter and grandson   Right-handed   Drinks 2 glasses of tea daily   Social Determinants of Health   Financial Resource Strain: Not on file  Food Insecurity: Not on file  Transportation Needs: Not on file  Physical Activity: Not on file  Stress: Not on file  Social Connections: Not on file  Intimate Partner  Violence: Not on file    Past Surgical History:  Procedure Laterality Date   BLADDER REPAIR     AGE 43   NASAL SINUS SURGERY     PELVIC LAPAROSCOPY     X 5   URETHRAL STRICTURE DILATATION      Family History  Problem Relation Age of Onset   Cancer Mother        VULVAR CANCER   Heart disease Father        MI in his 6s   Cancer Brother        SARCOMA   Heart disease Brother        WPW   Diabetes Paternal Grandmother    Colon cancer Neg Hx     Allergies  Allergen Reactions   Azithromycin Other (See Comments)    GI Issues/IBS Exacerbation   Breo Ellipta [Fluticasone Furoate-Vilanterol] Hives   Entex Other (See Comments)    Tongue peeling    Erythromycin Diarrhea   Promethazine Other (See Comments)    Made butt feel like it was on fire -- Suppository     Singulair [Montelukast] Hives   Stadol [Butorphanol] Other (See Comments)    dizziness   Tamiflu [Oseltamivir Phosphate] Nausea And Vomiting   Zofran [Ondansetron Hcl] Nausea Only and Other (See Comments)    Makes nausea much worse and dizziness    Augmentin [Amoxicillin-Pot Clavulanate] Other (See Comments)    GI issues/IBS Exacerbation    Ciprofloxacin Other (See Comments)    GI issues/IBS Exacerbation   Levofloxacin Other (See Comments)    GI issues/IBS Exacerbation   Moxifloxacin Hcl In Nacl Other (See Comments)    GI issues/IBS Exacerbation   Sulfa Antibiotics Rash   Tetracyclines & Related Rash    Current Outpatient Medications on File Prior to Visit  Medication Sig Dispense Refill   estradiol (ESTRACE) 1 MG tablet Take 1 mg by mouth daily.     hydrOXYzine (ATARAX) 10 MG tablet Take 1 tablet (10 mg total) by mouth 3 (three) times daily as needed for itching. 30 tablet 2   medroxyPROGESTERone (PROVERA) 2.5 MG tablet Take 2.5 mg by mouth daily.     omeprazole (PRILOSEC) 40 MG capsule TAKE 1 CAPSULE BY MOUTH EVERY DAY 30 capsule 5   No current facility-administered medications on file prior to visit.     BP 138/78   Pulse 60   Temp 98 F (36.7 C)   Resp 18   Ht 5\' 6"  (1.676 m)   Wt 205 lb 3.2 oz (93.1 kg)   LMP 09/27/2011   SpO2 100%   BMI 33.12 kg/m  Objective:   Physical Exam   General Appearance- Not in acute distress.    Chest and Lung Exam Auscultation: Breath sounds:-Normal. Clear even and unlabored. Adventitious sounds:- No Adventitious sounds.  Cardiovascular Auscultation:Rythm - Regular, rate and rythm. Heart Sounds -Normal heart sounds.  Abdomen Inspection:-Inspection Normal.  Palpation/Perucssion: Palpation and Percussion of the abdomen reveal- faint rt lower quadrant/iliac crest are pain on palpation., No Rebound tenderness, No rigidity(Guarding) and No Palpable abdominal masses.  Liver:-Normal.  Spleen:- Normal.   Back Mid lumbar spine tenderness to palpation. Pain on straight leg lift. Pain on lateral movements and flexion/extension of the spine.  Lower ext neurologic  L5-S1 sensation intact bilaterally. Normal patellar reflexes bilaterally. No foot drop bilaterally.   Right hip-no pain on palpation of hip as well as pain on rotation.  When I do rotate her hip can hear audible pop randomly.     Assessment & Plan:   Patient Instructions  Rt hip pain with with some lumbar back pain.  Will give toradol 30 mg IM.  Start medrol 4 mg 6 day taper dose.  Refer to sports med. I think if you call on Monday they can get you in quickly next week.   Give me update on Monday morning how you are doing.   Sign release form so we can fax form to UC to get overead reports. Ask you to pick up disc.  If pain increases over the weekend then be seen in UC cone or ED.  Follow up sport med or as regularly scheduled with pcp or as prn  Mackie Pai, PA-C      Time spent with patient today was 45  minutes which consisted of chart review, discussing diagnosis, work up treatment and documentation.

## 2021-11-28 ENCOUNTER — Telehealth: Payer: Self-pay | Admitting: *Deleted

## 2021-11-28 ENCOUNTER — Encounter: Payer: Self-pay | Admitting: *Deleted

## 2021-11-28 NOTE — Patient Instructions (Signed)
Visit Information  Thank you for taking time to visit with me today. Please don't hesitate to contact me if I can be of assistance to you.   Following are the goals we discussed today:   Goals Addressed             This Visit's Progress    COMPLETED: Care Coordination Activities: no follow up required   On track    Care Coordination Interventions: Evaluation of current treatment plan related to pain and patient's adherence to plan as established by provider Reviewed medications with patient and discussed adherence: patient reports does not take medication on daily basis- reports takes all prn; confirms she did not take the recently prescribed prednisone due to feeling poorly after obtaining flu vaccine; reports she has the medication but did not take; she plans to discuss with sports medicine provider at upcoming scheduled office visit Reviewed scheduled/upcoming provider appointments including 12/02/21- sports medicine provider Advised patient to discuss ongoing plan of care for pain with provider Assessed social determinant of health barriers Reviewed recent PCP office visit 11/22/21: patient verbalizes good understanding of post- visit instructions and denies questions          If you are experiencing a Mental Health or [place] or need someone to talk to, please  call the Suicide and Crisis Lifeline: 988 call the [place] National Suicide Prevention Lifeline: 479-689-7602 or TTY: 217-631-7148 TTY (416)496-1899) to talk to a trained counselor call 1-800-273-TALK (toll free, 24 hour hotline) go to [facility] [address], [place] [phone]) call the [place]: ([phone] call 911   Patient verbalizes understanding of instructions and care plan provided today and agrees to view in [place]. Active MyChart status and patient understanding of how to access instructions and care plan via MyChart confirmed  with patient.     No further follow up required: no further or ongoing care coordination needs identified today  Oneta Rack, RN, BSN, CCRN Alumnus RN CM Care Coordination/ Transition of [place] Management [phone]: direct office

## 2021-11-28 NOTE — Patient Outreach (Signed)
  Care Coordination   Initial Visit Note   11/28/2021 Name: Tracy Larsen MRN: [id number] DOB: [birth year]  Tracy Larsen is a 61 y.o. year old female who sees Copland, Gay Filler, MD for primary care. I spoke with  Tracy Larsen by phone today.  What matters to the patients health and wellness today?  "I am doing some better, but am looking forward to seeing the sports medicine doctor next week.  I did get my flu vaccine, but it made me feel poorly, so I held off on taking the prednisone; otherwise, I just take the medicines I am prescribed as needed-- no change from how I have been doing this"  Interventions provided; no further or ongoing care coordination needs identified today    Goals Addressed             This Visit's Progress    COMPLETED: Care Coordination Activities: no follow up required   On track    Care Coordination Interventions: Evaluation of current treatment plan related to pain and patient's adherence to plan as established by provider Reviewed medications with patient and discussed adherence: patient reports does not take medication on daily basis- reports takes all prn; confirms she did not take the recently prescribed prednisone due to feeling poorly after obtaining flu vaccine; reports she has the medication but did not take; she plans to discuss with sports medicine provider at upcoming scheduled office visit Reviewed scheduled/upcoming provider appointments including 12/02/21- sports medicine provider Advised patient to discuss ongoing plan of care for pain with provider Assessed social determinant of health barriers Reviewed recent PCP office visit 11/22/21: patient verbalizes good understanding of post- visit instructions and denies questions          SDOH assessments and interventions completed:  Yes  SDOH Interventions Today    Flowsheet Row Most Recent Value  SDOH Interventions   Food Insecurity Interventions Intervention Not Indicated   Transportation Interventions Intervention Not Indicated  [drives self]       Care Coordination Interventions Activated:  Yes  Care Coordination Interventions:  Yes, provided   Follow up plan: No further intervention required.   Encounter Outcome:  Pt. Visit Completed   Oneta Rack, RN, BSN, CCRN Alumnus RN CM Care Coordination/ Transition of [place] Management [phone]: direct office

## 2021-12-02 ENCOUNTER — Ambulatory Visit: Payer: BC Managed Care – PPO | Admitting: Family Medicine

## 2021-12-02 ENCOUNTER — Encounter: Payer: Self-pay | Admitting: Family Medicine

## 2021-12-02 VITALS — BP 118/66 | Ht 66.0 in | Wt 205.0 lb

## 2021-12-02 DIAGNOSIS — M5416 Radiculopathy, lumbar region: Secondary | ICD-10-CM | POA: Insufficient documentation

## 2021-12-02 NOTE — Assessment & Plan Note (Signed)
Tylenol for baseline pain relief (1-2 extra strength tabs 3x/day), prednisone dose pack as directed.  Activity as able.  Home exercises/stretches provided with further consideration for MRI and PT if not improving.  MRI would be beneficial if consideration for steroid injection in the future.

## 2021-12-02 NOTE — Patient Instructions (Signed)
You have lumbar radiculopathy (a pinched nerve in your low back). Ok to take tylenol for baseline pain relief (1-2 extra strength tabs 3x/day) Take prednisone dose pack as directed. Stay as active as possible. Physical therapy has been shown to be helpful as well. Do home exercises/stretches daily. Strengthening of low back muscles, abdominal musculature are key for long term pain relief. If not improving, will consider further imaging (MRI). Let me know in 1-2 weeks how you're doing (mychart or call me).

## 2021-12-02 NOTE — Progress Notes (Signed)
  SUBJECTIVE:   CHIEF COMPLAINT / HPI:   61 year old female presenting with 6-7 weeks of right low back pain with intermittent right groin pain.  She notes that she self diagnosed sciatica but denies any shooting pain down the leg.  She has iced and massaged vigorously which sometimes helps.  She notes that riding in her car and sitting worsens the pain and flexion makes it better.  She notes sometimes there is hip catching/popping the last few months but without pain; denies any appreciable bulges or masses.  She was seen at [facility] urgent care couple weeks ago and received x-rays of hip/spine/pelvis.  She also received a shot of dexamethasone with Toradol which greatly helped and almost completely relieved her pain.  She was prescribed a prednisone taper last Friday but has not started it yet.  PERTINENT  PMH / PSH: IBS-mixed  OBJECTIVE:  BP 118/66   Ht 5\' 6"  (1.676 m)   Wt 205 lb (93 kg)   LMP 09/27/2011   BMI 33.09 kg/m  Hip, right: TTP noted at [facility] joint and L4 spinous process, paraspinal muscles, right gluteal musculature. No obvious rash, erythema, ecchymosis, or edema. ROM full in all directions; Strength 5/5 in IR/ER/Flex/Ext/Abd/Add. Pelvic alignment unremarkable to inspection and palpation. Greater trochanter tenderness to palpation but similar on left side. No tenderness over piriformis.  Provocative Testing:   - FABER test: pain lateral to SI joint   - FADIR test: NEG   - Ober's test: NEG    - Thomas test: NEG  ASSESSMENT/PLAN:  Lumbar radiculopathy Assessment & Plan: Tylenol for baseline pain relief (1-2 extra strength tabs 3x/day), prednisone dose pack as directed.  Activity as able.  Home exercises/stretches provided with further consideration for MRI and PT if not improving.  MRI would be beneficial if consideration for steroid injection in the future.   Wells Guiles, DO 12/02/2021, 10:03 AM PGY-2, [place]

## 2021-12-31 ENCOUNTER — Other Ambulatory Visit: Payer: Self-pay

## 2021-12-31 DIAGNOSIS — M5416 Radiculopathy, lumbar region: Secondary | ICD-10-CM

## 2022-01-02 ENCOUNTER — Telehealth: Payer: Self-pay | Admitting: *Deleted

## 2022-01-02 NOTE — Telephone Encounter (Signed)
Patient left a message stating that she was wanting to get an MRI of her right hip as well.  Please call patient to discuss.  Thanks [employer]

## 2022-01-12 ENCOUNTER — Ambulatory Visit
Admission: RE | Admit: 2022-01-12 | Discharge: 2022-01-12 | Disposition: A | Discharge status: Home / Self Care | Payer: BC Managed Care – PPO | Source: Ambulatory Visit | Attending: Family Medicine | Admitting: Family Medicine

## 2022-01-12 DIAGNOSIS — M5416 Radiculopathy, lumbar region: Secondary | ICD-10-CM | POA: Diagnosis not present

## 2022-01-28 DIAGNOSIS — J208 Acute bronchitis due to other specified organisms: Secondary | ICD-10-CM | POA: Diagnosis not present

## 2022-01-28 DIAGNOSIS — B9689 Other specified bacterial agents as the cause of diseases classified elsewhere: Secondary | ICD-10-CM | POA: Diagnosis not present

## 2022-01-28 DIAGNOSIS — R6883 Chills (without fever): Secondary | ICD-10-CM | POA: Diagnosis not present

## 2022-01-28 DIAGNOSIS — R059 Cough, unspecified: Secondary | ICD-10-CM | POA: Diagnosis not present

## 2022-01-28 DIAGNOSIS — Z8709 Personal history of other diseases of the respiratory system: Secondary | ICD-10-CM | POA: Diagnosis not present

## 2022-01-30 ENCOUNTER — Emergency Department (HOSPITAL_BASED_OUTPATIENT_CLINIC_OR_DEPARTMENT_OTHER): Payer: BC Managed Care – PPO

## 2022-01-30 ENCOUNTER — Encounter (HOSPITAL_BASED_OUTPATIENT_CLINIC_OR_DEPARTMENT_OTHER): Payer: Self-pay | Admitting: Emergency Medicine

## 2022-01-30 ENCOUNTER — Other Ambulatory Visit: Payer: Self-pay

## 2022-01-30 ENCOUNTER — Emergency Department (HOSPITAL_BASED_OUTPATIENT_CLINIC_OR_DEPARTMENT_OTHER)
Admission: EM | Admit: 2022-01-30 | Discharge: 2022-01-30 | Disposition: A | Discharge status: Home / Self Care | Payer: BC Managed Care – PPO | Attending: Emergency Medicine | Admitting: Emergency Medicine

## 2022-01-30 DIAGNOSIS — R1031 Right lower quadrant pain: Secondary | ICD-10-CM | POA: Insufficient documentation

## 2022-01-30 DIAGNOSIS — R109 Unspecified abdominal pain: Secondary | ICD-10-CM | POA: Diagnosis not present

## 2022-01-30 LAB — URINALYSIS, ROUTINE W REFLEX MICROSCOPIC
Bacteria, UA: NONE SEEN
Bilirubin Urine: NEGATIVE
Glucose, UA: NEGATIVE mg/dL
Ketones, ur: NEGATIVE mg/dL
Leukocytes,Ua: NEGATIVE
Nitrite: NEGATIVE
Protein, ur: NEGATIVE mg/dL
Specific Gravity, Urine: 1.006 (ref 1.005–1.030)
pH: 5.5 (ref 5.0–8.0)

## 2022-01-30 LAB — CBC
HCT: 43.6 % (ref 36.0–46.0)
Hemoglobin: 14.2 g/dL (ref 12.0–15.0)
MCH: 29.6 pg (ref 26.0–34.0)
MCHC: 32.6 g/dL (ref 30.0–36.0)
MCV: 91 fL (ref 80.0–100.0)
Platelets: 239 10*3/uL (ref 150–400)
RBC: 4.79 MIL/uL (ref 3.87–5.11)
RDW: 12.1 % (ref 11.5–15.5)
WBC: 6.4 10*3/uL (ref 4.0–10.5)
nRBC: 0 % (ref 0.0–0.2)

## 2022-01-30 LAB — COMPREHENSIVE METABOLIC PANEL
ALT: 9 U/L (ref 0–44)
AST: 13 U/L — ABNORMAL LOW (ref 15–41)
Albumin: 4.3 g/dL (ref 3.5–5.0)
Alkaline Phosphatase: 90 U/L (ref 38–126)
Anion gap: 10 (ref 5–15)
BUN: 17 mg/dL (ref 6–20)
CO2: 27 mmol/L (ref 22–32)
Calcium: 9.4 mg/dL (ref 8.9–10.3)
Chloride: 102 mmol/L (ref 98–111)
Creatinine, Ser: 0.93 mg/dL (ref 0.44–1.00)
GFR, Estimated: >60 mL/min (ref >60)
Glucose, Bld: 101 mg/dL — ABNORMAL HIGH (ref 70–99)
Potassium: 4.4 mmol/L (ref 3.5–5.1)
Sodium: 139 mmol/L (ref 135–145)
Total Bilirubin: 0.4 mg/dL (ref 0.3–1.2)
Total Protein: 7.7 g/dL (ref 6.5–8.1)

## 2022-01-30 LAB — LACTIC ACID, PLASMA
Lactic Acid, Venous: 1.1 mmol/L (ref 0.5–1.9)
Lactic Acid, Venous: 1.2 mmol/L (ref 0.5–1.9)

## 2022-01-30 LAB — LIPASE, BLOOD: Lipase: 22 U/L (ref 11–51)

## 2022-01-30 MED ORDER — OXYCODONE-ACETAMINOPHEN 5-325 MG PO TABS
1.0000 | ORAL_TABLET | ORAL | Status: DC | PRN
  Administered 2022-01-30: 1 via ORAL
  Filled 2022-01-30: qty 1

## 2022-01-30 MED ORDER — IOHEXOL 300 MG/ML  SOLN
100.0000 mL | Freq: Once | INTRAMUSCULAR | Status: AC | PRN
  Administered 2022-01-30: 100 mL via INTRAVENOUS

## 2022-01-30 NOTE — Discharge Instructions (Addendum)
Your CT scan today did not show any signs of kidney stone, appendicitis or masses, follow-up with your urologist and primary care doctor if pain continues.  If you develop new or worsening pain, fevers, persistent vomiting or other new or concerning symptoms return for reevaluation.

## 2022-01-30 NOTE — ED Provider Notes (Signed)
[facility] EMERGENCY DEPT Provider Note   CSN: [id number] Arrival date & time: 01/30/22  0746     History  Chief Complaint  Patient presents with   Flank Pain    Tracy Larsen is a 62 y.o. female.  Tracy Larsen is a 62 y.o. female with a history of IBS, MVP, recurrent UTIs, endometriosis, who presents to the emergency department for evaluation of right flank pain.  She reports pain has been coming and going for a few weeks.  She reports over the past few days it has been more persistent.  Today she noticed decreased urine output and so decided to come get checked out.  She had an MRI of her back recently as she thought this could be contributing to pain but it was negative.  She was recently placed on cefdinir for bronchitis.  She has not had any dysuria or urinary frequency but reports she has not been urinating as much.  Has not noted any blood in her urine.  Reports history of recurrent UTIs and 5 years ago was septic from UTI.  Was previously followed by Dr. Risa Grill with urology prior to his retirement.  No known history of kidney stones.  The history is provided by the patient and medical records.  Flank Pain Associated symptoms include abdominal pain.       Home Medications Prior to Admission medications   Medication Sig Start Date End Date Taking? Authorizing Provider  cefdinir (OMNICEF) 300 MG capsule Take 300 mg by mouth 2 (two) times daily. 01/28/22   [provider]  estradiol (ESTRACE) 1 MG tablet Take 1 mg by mouth daily.    [provider]  hydrOXYzine (ATARAX) 10 MG tablet Take 1 tablet (10 mg total) by mouth 3 (three) times daily as needed for itching. Patient not taking: Reported on 11/28/2021 03/14/21   Copland, Gay Filler, MD  medroxyPROGESTERone (PROVERA) 2.5 MG tablet Take 2.5 mg by mouth daily.    [provider]  methylPREDNISolone (MEDROL) 4 MG tablet Standard taper dose over 6 days Patient not taking: Reported  on 11/28/2021 11/22/21   Saguier, Percell Miller, PA-C  omeprazole (PRILOSEC) 40 MG capsule TAKE 1 CAPSULE BY MOUTH EVERY DAY 07/31/21   Copland, Gay Filler, MD  predniSONE (DELTASONE) 10 MG tablet Take by mouth. Taper dose 01/28/22   [provider]      Allergies    Azithromycin, Breo ellipta [fluticasone furoate-vilanterol], Entex, Erythromycin, Promethazine, Singulair [montelukast], Stadol [butorphanol], Tamiflu [oseltamivir phosphate], Zofran [ondansetron hcl], Augmentin [amoxicillin-pot clavulanate], Ciprofloxacin, Levofloxacin, Moxifloxacin hcl in nacl, Sulfa antibiotics, and Tetracyclines & related    Review of Systems   Review of Systems  Constitutional:  Negative for chills and fever.  Gastrointestinal:  Positive for abdominal pain.  Genitourinary:  Positive for flank pain.    Physical Exam Updated Vital Signs BP 127/71   Pulse (!) 59   Temp 98 F (36.7 C) (Oral)   Resp 13   LMP 09/27/2011   SpO2 (!) 81%  Physical Exam Vitals and nursing note reviewed.  Constitutional:      General: She is not in acute distress.    Appearance: Normal appearance. She is well-developed. She is not ill-appearing or diaphoretic.  HENT:     Head: Normocephalic and atraumatic.  Eyes:     General:        Right eye: No discharge.        Left eye: No discharge.     Pupils: Pupils are equal, round,  and reactive to light.  Cardiovascular:     Rate and Rhythm: Normal rate and regular rhythm.     Pulses: Normal pulses.     Heart sounds: Normal heart sounds.  Pulmonary:     Effort: Pulmonary effort is normal. No respiratory distress.     Breath sounds: Normal breath sounds. No wheezing or rales.     Comments: Respirations equal and unlabored, patient able to speak in full sentences, lungs clear to auscultation bilaterally  Abdominal:     General: Bowel sounds are normal. There is no distension.     Palpations: Abdomen is soft. There is no mass.     Tenderness: There is no abdominal tenderness.  There is no guarding.     Comments: Abdomen soft, nondistended, nontender to palpation in all quadrants but there is some right flank tenderness no guarding or rebound tenderness  Musculoskeletal:        General: No deformity.     Cervical back: Neck supple.  Skin:    General: Skin is warm and dry.     Capillary Refill: Capillary refill takes less than 2 seconds.  Neurological:     Mental Status: She is alert and oriented to person, place, and time.     Coordination: Coordination normal.     Comments: Speech is clear, able to follow commands CN III-XII intact Normal strength in upper and lower extremities bilaterally including dorsiflexion and plantar flexion, strong and equal grip strength Sensation normal to light and sharp touch Moves extremities without ataxia, coordination intact  Psychiatric:        Mood and Affect: Mood normal.        Behavior: Behavior normal.     ED Results / Procedures / Treatments   Labs (all labs ordered are listed, but only abnormal results are displayed) Labs Reviewed  COMPREHENSIVE METABOLIC PANEL - Abnormal; Notable for the following components:      Result Value   Glucose, Bld 101 (*)    AST 13 (*)    All other components within normal limits  URINALYSIS, ROUTINE W REFLEX MICROSCOPIC - Abnormal; Notable for the following components:   Color, Urine COLORLESS (*)    Hgb urine dipstick TRACE (*)    All other components within normal limits  CBC  LIPASE, BLOOD  LACTIC ACID, PLASMA  LACTIC ACID, PLASMA    EKG None  Radiology CT Abdomen Pelvis W Contrast  Result Date: 01/30/2022 CLINICAL DATA:  Right flank pain going on for weeks. EXAM: CT ABDOMEN AND PELVIS WITH CONTRAST TECHNIQUE: Multidetector CT imaging of the abdomen and pelvis was performed using the standard protocol following bolus administration of intravenous contrast. RADIATION DOSE REDUCTION: This exam was performed according to the departmental dose-optimization program which  includes automated exposure control, adjustment of the mA and/or kV according to patient size and/or use of iterative reconstruction technique. CONTRAST:  149mL OMNIPAQUE IOHEXOL 300 MG/ML  SOLN COMPARISON:  CT 06/25/2019 and older. FINDINGS: Lower chest: Mild linear opacity lung bases likely scar or atelectasis. No pleural effusion. Hepatobiliary: The liver has several tiny subcentimeter low-attenuation cystic foci. To small to characterize although statistically likely small benign cystic lesions and these are unchanged from previous. No specific follow-up. Patent portal vein. The gallbladder is present. Pancreas: Moderate fatty atrophy of the pancreas. Spleen: Spleen is nonenlarged. Adrenals/Urinary Tract: Adrenal glands are preserved. Left kidney is unremarkable. The right kidney has moderate atrophy. There is also a low-attenuation lesion along the posterior aspect of the lower pole  of the right kidney measuring 17 mm in diameter and has Hounsfield unit of 14. no specific imaging follow-up recommended. Stomach/Bowel: On this non oral contrast exam large bowel is of normal course and caliber with scattered stool. Normal appendix in the right lower quadrant. Stomach is collapsed. Small bowel is nondilated. No free air or free fluid. Vascular/Lymphatic: Normal caliber aorta and IVC. No developing abnormal lymph node enlargement present in the abdomen and pelvis. Reproductive: Uterus and bilateral adnexa are unremarkable. Other: Small fat containing umbilical hernia.  No ascites. Musculoskeletal: Slight curvature of the spine. Scattered degenerative changes along the spine, mild. IMPRESSION: No bowel obstruction, free air or free fluid. Normal appendix. Scattered stool. Moderate atrophy of the right kidney. Electronically Signed   By: Jill Side M.D.   On: 01/30/2022 17:05     Procedures Procedures    Medications Ordered in ED Medications  iohexol (OMNIPAQUE) 300 MG/ML solution 100 mL (100 mLs  Intravenous Contrast Given 01/30/22 1645)    ED Course/ Medical Decision Making/ A&P                           Medical Decision Making Amount and/or Complexity of Data Reviewed Labs: ordered. Radiology: ordered.  Risk Prescription drug management.   Patient presents to the ED with complaints of right flank pain that has been intermittent for a few weeks, had MRI of her back recently which was negative. Patient nontoxic appearing, vitals without significant abnormalities. On exam patient is tender over the right flank, no peritoneal signs. DDX: nephrolithiasis, pyelonephritis/UTI, cholecystitis, pancreatitis, bowel obstruction/perforation, appendicitis, dissection, feel nephrolithiasis is most likely at this time will evaluate with labs and CT renal study, analgesics, anti-emetics, and fluids ordered.   No leukocytosis, anemia, or significant electrolyte derangements.  CT abdomen pelvis with contrast independently viewed and interpreted without signs of renal stone or hydronephrosis, no perinephric stranding, there is some moderate atrophy of the right kidney.  No evidence of bowel obstruction, normal appendix, no other acute findings. Renal function preserved. Urinalysis without appearance of infection. Patient tolerating PO in the ER and pain has improved with treatment.  Discussed reassuring results with patient, recommend that she follow-up with urologist due to ongoing issues with flank pain.  Provided strict return precautions and discharged home in good condition.        Final Clinical Impression(s) / ED Diagnoses Final diagnoses:  Right flank pain    Rx / DC Orders ED Discharge Orders     None         Jacqlyn Larsen, [place] 02/14/22 1354    Tegeler, Gwenyth Allegra, MD 02/17/22 [phone]

## 2022-01-30 NOTE — ED Triage Notes (Signed)
Right side flank pain coming and going for weeks, has had MRI of her back recently  and was negative.tx bronchitis 2 days ago on ceftim. Today notices decreased urine  output, has had sepsis from UTI 5 years ago.

## 2022-02-13 DIAGNOSIS — R3129 Other microscopic hematuria: Secondary | ICD-10-CM | POA: Diagnosis not present

## 2022-02-13 DIAGNOSIS — R3121 Asymptomatic microscopic hematuria: Secondary | ICD-10-CM | POA: Diagnosis not present

## 2022-02-13 DIAGNOSIS — N261 Atrophy of kidney (terminal): Secondary | ICD-10-CM | POA: Diagnosis not present

## 2022-02-13 DIAGNOSIS — N390 Urinary tract infection, site not specified: Secondary | ICD-10-CM | POA: Diagnosis not present

## 2022-02-18 DIAGNOSIS — R3129 Other microscopic hematuria: Secondary | ICD-10-CM | POA: Diagnosis not present

## 2022-02-19 DIAGNOSIS — N261 Atrophy of kidney (terminal): Secondary | ICD-10-CM | POA: Diagnosis not present

## 2022-03-21 DIAGNOSIS — Z01419 Encounter for gynecological examination (general) (routine) without abnormal findings: Secondary | ICD-10-CM | POA: Diagnosis not present

## 2022-03-21 DIAGNOSIS — Z6833 Body mass index (BMI) 33.0-33.9, adult: Secondary | ICD-10-CM | POA: Diagnosis not present

## 2022-03-21 DIAGNOSIS — Z124 Encounter for screening for malignant neoplasm of cervix: Secondary | ICD-10-CM | POA: Diagnosis not present

## 2022-03-21 LAB — HM PAP SMEAR: HM Pap smear: NORMAL

## 2022-03-21 LAB — RESULTS CONSOLE HPV: CHL HPV: NEGATIVE

## 2022-03-24 ENCOUNTER — Other Ambulatory Visit: Payer: Self-pay | Admitting: Obstetrics and Gynecology

## 2022-03-24 DIAGNOSIS — N644 Mastodynia: Secondary | ICD-10-CM

## 2022-03-25 ENCOUNTER — Other Ambulatory Visit: Payer: Self-pay | Admitting: Obstetrics and Gynecology

## 2022-03-25 DIAGNOSIS — R1031 Right lower quadrant pain: Secondary | ICD-10-CM

## 2022-04-23 ENCOUNTER — Ambulatory Visit
Admission: RE | Admit: 2022-04-23 | Discharge: 2022-04-23 | Disposition: A | Discharge status: Home / Self Care | Payer: BC Managed Care – PPO | Source: Ambulatory Visit | Attending: Obstetrics and Gynecology | Admitting: Obstetrics and Gynecology

## 2022-04-23 DIAGNOSIS — R1031 Right lower quadrant pain: Secondary | ICD-10-CM | POA: Diagnosis not present

## 2022-04-23 DIAGNOSIS — N390 Urinary tract infection, site not specified: Secondary | ICD-10-CM | POA: Diagnosis not present

## 2022-04-23 DIAGNOSIS — R9389 Abnormal findings on diagnostic imaging of other specified body structures: Secondary | ICD-10-CM | POA: Diagnosis not present

## 2022-04-23 DIAGNOSIS — Z8742 Personal history of other diseases of the female genital tract: Secondary | ICD-10-CM | POA: Diagnosis not present

## 2022-04-29 DIAGNOSIS — R9389 Abnormal findings on diagnostic imaging of other specified body structures: Secondary | ICD-10-CM | POA: Diagnosis not present

## 2022-05-07 ENCOUNTER — Ambulatory Visit
Admission: RE | Admit: 2022-05-07 | Discharge: 2022-05-07 | Disposition: A | Discharge status: Home / Self Care | Payer: BC Managed Care – PPO | Source: Ambulatory Visit | Attending: Obstetrics and Gynecology | Admitting: Obstetrics and Gynecology

## 2022-05-07 DIAGNOSIS — N644 Mastodynia: Secondary | ICD-10-CM

## 2022-05-29 DIAGNOSIS — N760 Acute vaginitis: Secondary | ICD-10-CM | POA: Diagnosis not present

## 2022-05-29 DIAGNOSIS — R102 Pelvic and perineal pain: Secondary | ICD-10-CM | POA: Diagnosis not present

## 2022-07-02 DIAGNOSIS — R3129 Other microscopic hematuria: Secondary | ICD-10-CM | POA: Diagnosis not present

## 2022-07-02 DIAGNOSIS — N390 Urinary tract infection, site not specified: Secondary | ICD-10-CM | POA: Diagnosis not present

## 2022-07-28 DIAGNOSIS — Z0000 Encounter for general adult medical examination without abnormal findings: Secondary | ICD-10-CM | POA: Diagnosis not present

## 2022-08-04 ENCOUNTER — Encounter: Payer: Self-pay | Admitting: Internal Medicine

## 2022-08-08 ENCOUNTER — Other Ambulatory Visit: Payer: Self-pay | Admitting: Family Medicine

## 2022-08-22 DIAGNOSIS — S46912A Strain of unspecified muscle, fascia and tendon at shoulder and upper arm level, left arm, initial encounter: Secondary | ICD-10-CM | POA: Diagnosis not present

## 2022-08-22 DIAGNOSIS — U071 COVID-19: Secondary | ICD-10-CM | POA: Diagnosis not present

## 2022-08-22 DIAGNOSIS — I8002 Phlebitis and thrombophlebitis of superficial vessels of left lower extremity: Secondary | ICD-10-CM | POA: Diagnosis not present

## 2022-08-28 ENCOUNTER — Ambulatory Visit: Payer: BC Managed Care – PPO | Admitting: Physician Assistant

## 2022-08-28 ENCOUNTER — Ambulatory Visit (HOSPITAL_BASED_OUTPATIENT_CLINIC_OR_DEPARTMENT_OTHER)
Admission: RE | Admit: 2022-08-28 | Discharge: 2022-08-28 | Disposition: A | Discharge status: Home / Self Care | Payer: BC Managed Care – PPO | Source: Ambulatory Visit | Attending: Physician Assistant | Admitting: Physician Assistant

## 2022-08-28 ENCOUNTER — Encounter: Payer: Self-pay | Admitting: Physician Assistant

## 2022-08-28 VITALS — BP 124/72 | HR 70 | Temp 97.6°F | Resp 20 | Wt 211.0 lb

## 2022-08-28 DIAGNOSIS — L989 Disorder of the skin and subcutaneous tissue, unspecified: Secondary | ICD-10-CM

## 2022-08-28 DIAGNOSIS — T148XXA Other injury of unspecified body region, initial encounter: Secondary | ICD-10-CM | POA: Diagnosis not present

## 2022-08-28 DIAGNOSIS — I80299 Phlebitis and thrombophlebitis of other deep vessels of unspecified lower extremity: Secondary | ICD-10-CM | POA: Insufficient documentation

## 2022-08-28 DIAGNOSIS — M79605 Pain in left leg: Secondary | ICD-10-CM | POA: Diagnosis not present

## 2022-08-28 DIAGNOSIS — R600 Localized edema: Secondary | ICD-10-CM | POA: Diagnosis not present

## 2022-08-28 NOTE — Progress Notes (Signed)
Established patient visit   Patient: Tracy Larsen   DOB: 1960-09-20   62 y.o. Female  MRN: [id number] Visit Date: 08/28/2022  Today's healthcare provider: Alfredia Ferguson, PA-C   Chief Complaint  Patient presents with   Ankle Injury    Left ankle   Subjective    HPI Discussed the use of AI scribe software for clinical note transcription with the patient, who gave verbal consent to proceed.  History of Present Illness   The patient presents with a persistent ankle swelling and a small, hard, linear mass in the area of a previous injury sustained in March. The injury occurred when a case of water fell on the patient's ankle, causing significant bruising and swelling. The patient managed the injury with ice and elevation, but the swelling has persisted, and a small mass has developed in the area. The mass is sensitive to touch and the area around it swells, particularly by the end of the day. The patient also reports that the area turns purple when she is in the bath or shower. The patient has a family history of Factor V Leiden, with a daughter who has had multiple blood clots, which raises concerns about the possibility of a blood clot in the ankle.  In addition, the patient has a skin lesion on her left thigh that has not healed despite the application of hydrocortisone cream and alcohol. The patient has a history of basal cell carcinoma and is concerned that this lesion may be another skin cancer. The patient has an appointment with a dermatologist scheduled for February.      Medications: Outpatient Medications Prior to Visit  Medication Sig   estradiol (ESTRACE) 1 MG tablet Take 1 mg by mouth daily.   medroxyPROGESTERone (PROVERA) 2.5 MG tablet Take 2.5 mg by mouth daily.   omeprazole (PRILOSEC) 40 MG capsule TAKE 1 CAPSULE BY MOUTH EVERY DAY   predniSONE (DELTASONE) 10 MG tablet Take by mouth. Taper dose   [DISCONTINUED] cefdinir (OMNICEF) 300 MG capsule Take 300 mg by  mouth 2 (two) times daily.   [DISCONTINUED] hydrOXYzine (ATARAX) 10 MG tablet Take 1 tablet (10 mg total) by mouth 3 (three) times daily as needed for itching. (Patient not taking: Reported on 11/28/2021)   [DISCONTINUED] methylPREDNISolone (MEDROL) 4 MG tablet Standard taper dose over 6 days (Patient not taking: Reported on 11/28/2021)   No facility-administered medications prior to visit.    Review of Systems  Constitutional:  Negative for fatigue and fever.  Respiratory:  Negative for cough and shortness of breath.   Cardiovascular:  Negative for chest pain and leg swelling.  Gastrointestinal:  Negative for abdominal pain.  Musculoskeletal:  Positive for arthralgias.  Neurological:  Negative for dizziness and headaches.       Objective    Blood pressure 124/72, pulse 70, temperature 97.6 F (36.4 C), temperature source Oral, resp. rate 20, weight 211 lb (95.7 kg), last menstrual period 09/27/2011, SpO2 99%.   Physical Exam Vitals reviewed.  Constitutional:      Appearance: She is not ill-appearing.  HENT:     Head: Normocephalic.  Eyes:     Conjunctiva/sclera: Conjunctivae normal.  Cardiovascular:     Rate and Rhythm: Normal rate.  Pulmonary:     Effort: Pulmonary effort is normal. No respiratory distress.  Musculoskeletal:     Comments: L medial ankle with some light erythema/discoloration. Tender to touch, small soft tissue mass appreciated ~1-2 cm  Neurological:  General: No focal deficit present.     Mental Status: She is alert and oriented to person, place, and time.  Psychiatric:        Mood and Affect: Mood normal.        Behavior: Behavior normal.     No results found for any visits on 08/28/22.  Assessment & Plan     1. Phlebitis and thombophlb of deep vessels of unsp low extrm (HCC) - US Venous Img Lower Unilateral Left; Future  2. Hematoma Left ankle Hematoma -Order ultrasound to rule out superficial vein thrombosis but is fairly consistent w/  resolving hematoma. -Advise to apply heat to promote resolution of hematoma.  3. Skin Lesions -Skin lesions on thighs, nonhealing ,erythematous. -Advise to apply Vaseline or Aquaphor to raw lesion. -Advise to take pictures of lesions to track changes. -Continue with scheduled dermatology appointment in February. Consider calling to ask about cancellation list for potential earlier appointment.        Return if symptoms worsen or fail to improve.      I, Alfredia Ferguson, PA-C have reviewed all documentation for this visit. The documentation on  08/28/22   for the exam, diagnosis, procedures, and orders are all accurate and complete.    Alfredia Ferguson, PA-C  [facility] at [facility] [phone] (phone) [phone] (fax)  [facility]

## 2022-11-05 ENCOUNTER — Ambulatory Visit: Payer: BC Managed Care – PPO | Admitting: Internal Medicine

## 2022-11-13 ENCOUNTER — Ambulatory Visit (INDEPENDENT_AMBULATORY_CARE_PROVIDER_SITE_OTHER): Payer: BC Managed Care – PPO

## 2022-11-13 DIAGNOSIS — Z23 Encounter for immunization: Secondary | ICD-10-CM

## 2022-11-18 DIAGNOSIS — Z1283 Encounter for screening for malignant neoplasm of skin: Secondary | ICD-10-CM | POA: Diagnosis not present

## 2022-11-18 DIAGNOSIS — C44712 Basal cell carcinoma of skin of right lower limb, including hip: Secondary | ICD-10-CM | POA: Diagnosis not present

## 2022-11-18 DIAGNOSIS — C44719 Basal cell carcinoma of skin of left lower limb, including hip: Secondary | ICD-10-CM | POA: Diagnosis not present

## 2022-11-18 DIAGNOSIS — D225 Melanocytic nevi of trunk: Secondary | ICD-10-CM | POA: Diagnosis not present

## 2022-11-18 DIAGNOSIS — C44619 Basal cell carcinoma of skin of left upper limb, including shoulder: Secondary | ICD-10-CM | POA: Diagnosis not present

## 2022-11-18 DIAGNOSIS — C44519 Basal cell carcinoma of skin of other part of trunk: Secondary | ICD-10-CM | POA: Diagnosis not present

## 2022-11-21 ENCOUNTER — Telehealth: Payer: Self-pay | Admitting: Family Medicine

## 2022-11-21 NOTE — Telephone Encounter (Signed)
Pt called and stated that she was seen for a flu shot last week and was given documentation of her vaccinations for her job. However, she explained that the documents are missing the manufacturer name and #. She said she needs this back by Monday if possible. I advised patient that I can send a note as high priority in hopes that it can be addressed by today but not guaranteed.   She works for [employer] and needs it faxed to [phone]. Please call and advise pt.

## 2022-11-21 NOTE — Telephone Encounter (Signed)
Letter written with detailed information and faxed to number provided.   LVM letting patient know this has been completed and call with any problems.

## 2022-11-26 DIAGNOSIS — M545 Low back pain, unspecified: Secondary | ICD-10-CM | POA: Diagnosis not present

## 2022-11-26 DIAGNOSIS — Z20828 Contact with and (suspected) exposure to other viral communicable diseases: Secondary | ICD-10-CM | POA: Diagnosis not present

## 2022-11-26 DIAGNOSIS — R059 Cough, unspecified: Secondary | ICD-10-CM | POA: Diagnosis not present

## 2022-12-30 ENCOUNTER — Encounter: Payer: Self-pay | Admitting: Family Medicine

## 2022-12-30 DIAGNOSIS — L299 Pruritus, unspecified: Secondary | ICD-10-CM

## 2022-12-31 MED ORDER — HYDROXYZINE HCL 10 MG PO TABS: 10.0000 mg | ORAL_TABLET | Freq: Three times a day (TID) | ORAL | 0 refills | Status: DC | PRN

## 2022-12-31 NOTE — Telephone Encounter (Signed)

## 2022-12-31 NOTE — Telephone Encounter (Signed)
Pts last OV with you has not been in about 2 years- no pending apts. Are you okay with sending a Rx?

## 2023-01-05 DIAGNOSIS — R319 Hematuria, unspecified: Secondary | ICD-10-CM | POA: Diagnosis not present

## 2023-01-05 DIAGNOSIS — R1030 Lower abdominal pain, unspecified: Secondary | ICD-10-CM | POA: Diagnosis not present

## 2023-01-19 DIAGNOSIS — R102 Pelvic and perineal pain: Secondary | ICD-10-CM | POA: Diagnosis not present

## 2023-01-19 DIAGNOSIS — R9389 Abnormal findings on diagnostic imaging of other specified body structures: Secondary | ICD-10-CM | POA: Diagnosis not present

## 2023-02-11 ENCOUNTER — Ambulatory Visit: Payer: BC Managed Care – PPO | Admitting: Family Medicine

## 2023-02-11 ENCOUNTER — Emergency Department (HOSPITAL_BASED_OUTPATIENT_CLINIC_OR_DEPARTMENT_OTHER)
Admission: EM | Admit: 2023-02-11 | Discharge: 2023-02-11 | Disposition: A | Discharge status: Home / Self Care | Payer: BC Managed Care – PPO | Attending: Emergency Medicine | Admitting: Emergency Medicine

## 2023-02-11 ENCOUNTER — Encounter (HOSPITAL_BASED_OUTPATIENT_CLINIC_OR_DEPARTMENT_OTHER): Payer: Self-pay | Admitting: Emergency Medicine

## 2023-02-11 ENCOUNTER — Ambulatory Visit: Payer: Self-pay | Admitting: Family Medicine

## 2023-02-11 ENCOUNTER — Other Ambulatory Visit: Payer: Self-pay

## 2023-02-11 ENCOUNTER — Emergency Department (HOSPITAL_BASED_OUTPATIENT_CLINIC_OR_DEPARTMENT_OTHER): Payer: BC Managed Care – PPO

## 2023-02-11 VITALS — BP 118/80 | HR 72 | Temp 98.3°F | Resp 18 | Ht 66.0 in | Wt 214.0 lb

## 2023-02-11 DIAGNOSIS — R42 Dizziness and giddiness: Secondary | ICD-10-CM | POA: Diagnosis not present

## 2023-02-11 DIAGNOSIS — G43809 Other migraine, not intractable, without status migrainosus: Secondary | ICD-10-CM | POA: Diagnosis not present

## 2023-02-11 DIAGNOSIS — E860 Dehydration: Secondary | ICD-10-CM | POA: Insufficient documentation

## 2023-02-11 DIAGNOSIS — R519 Headache, unspecified: Secondary | ICD-10-CM | POA: Diagnosis not present

## 2023-02-11 LAB — CBC WITH DIFFERENTIAL/PLATELET
Abs Immature Granulocytes: 0.01 10*3/uL (ref 0.00–0.07)
Basophils Absolute: 0.1 10*3/uL (ref 0.0–0.1)
Basophils Relative: 1 %
Eosinophils Absolute: 0.2 10*3/uL (ref 0.0–0.5)
Eosinophils Relative: 2 %
HCT: 43.2 % (ref 36.0–46.0)
Hemoglobin: 14.3 g/dL (ref 12.0–15.0)
Immature Granulocytes: 0 %
Lymphocytes Relative: 35 %
Lymphs Abs: 2.6 10*3/uL (ref 0.7–4.0)
MCH: 29.5 pg (ref 26.0–34.0)
MCHC: 33.1 g/dL (ref 30.0–36.0)
MCV: 89.1 fL (ref 80.0–100.0)
Monocytes Absolute: 0.6 10*3/uL (ref 0.1–1.0)
Monocytes Relative: 8 %
Neutro Abs: 4 10*3/uL (ref 1.7–7.7)
Neutrophils Relative %: 54 %
Platelets: 302 10*3/uL (ref 150–400)
RBC: 4.85 MIL/uL (ref 3.87–5.11)
RDW: 11.9 % (ref 11.5–15.5)
WBC: 7.4 10*3/uL (ref 4.0–10.5)
nRBC: 0 % (ref 0.0–0.2)

## 2023-02-11 LAB — COMPREHENSIVE METABOLIC PANEL
ALT: 12 U/L (ref 0–44)
AST: 15 U/L (ref 15–41)
Albumin: 3.6 g/dL (ref 3.5–5.0)
Alkaline Phosphatase: 90 U/L (ref 38–126)
Anion gap: 7 (ref 5–15)
BUN: 14 mg/dL (ref 8–23)
CO2: 23 mmol/L (ref 22–32)
Calcium: 8.8 mg/dL — ABNORMAL LOW (ref 8.9–10.3)
Chloride: 106 mmol/L (ref 98–111)
Creatinine, Ser: 0.87 mg/dL (ref 0.44–1.00)
GFR, Estimated: >60 mL/min (ref >60)
Glucose, Bld: 91 mg/dL (ref 70–99)
Potassium: 4.2 mmol/L (ref 3.5–5.1)
Sodium: 136 mmol/L (ref 135–145)
Total Bilirubin: 0.3 mg/dL (ref 0.0–1.2)
Total Protein: 7.3 g/dL (ref 6.5–8.1)

## 2023-02-11 MED ORDER — KETOROLAC TROMETHAMINE 30 MG/ML IJ SOLN
30.0000 mg | Freq: Once | INTRAMUSCULAR | Status: AC
  Administered 2023-02-11: 30 mg via INTRAVENOUS
  Filled 2023-02-11: qty 1

## 2023-02-11 MED ORDER — METOCLOPRAMIDE HCL 10 MG PO TABS: 10.0000 mg | ORAL_TABLET | Freq: Four times a day (QID) | ORAL | 0 refills | Status: DC | PRN

## 2023-02-11 MED ORDER — PREDNISONE 50 MG PO TABS: 50.0000 mg | ORAL_TABLET | Freq: Every day | ORAL | 0 refills | Status: DC

## 2023-02-11 MED ORDER — MECLIZINE HCL 25 MG PO TABS: 25.0000 mg | ORAL_TABLET | Freq: Three times a day (TID) | ORAL | 0 refills | Status: DC | PRN

## 2023-02-11 MED ORDER — SODIUM CHLORIDE 0.9 % IV BOLUS
1000.0000 mL | Freq: Once | INTRAVENOUS | Status: AC
  Administered 2023-02-11: 1000 mL via INTRAVENOUS

## 2023-02-11 MED ORDER — PREDNISONE 50 MG PO TABS
60.0000 mg | ORAL_TABLET | Freq: Once | ORAL | Status: AC
  Administered 2023-02-11: 60 mg via ORAL
  Filled 2023-02-11: qty 1

## 2023-02-11 MED ORDER — METOCLOPRAMIDE HCL 5 MG/ML IJ SOLN
10.0000 mg | Freq: Once | INTRAMUSCULAR | Status: AC
  Administered 2023-02-11: 10 mg via INTRAVENOUS
  Filled 2023-02-11: qty 2

## 2023-02-11 MED ORDER — DIPHENHYDRAMINE HCL 50 MG/ML IJ SOLN
25.0000 mg | Freq: Once | INTRAMUSCULAR | Status: AC
  Administered 2023-02-11: 25 mg via INTRAVENOUS
  Filled 2023-02-11: qty 1

## 2023-02-11 NOTE — ED Triage Notes (Signed)
 Pt c/o nausea since Sun night; on Mon she got a HA and "vertigo"; sts she is light-sensitive "in general" and dizziness is not improving

## 2023-02-11 NOTE — Telephone Encounter (Signed)
 Called patient but did not reach, left message on machine.  Please let me know if she needs to be seen this afternoon, we can try to work her in.  She is also certainly welcome to send me a MyChart message with further details if this seems more like a routine matter to her

## 2023-02-11 NOTE — Telephone Encounter (Signed)
 Pt called the office "demanding an appointment" per FO. I let them know to use 3 PM appt spot.

## 2023-02-11 NOTE — Progress Notes (Signed)
 [facility] at [facility] [address] [place], [place] [address] [phone]  Date:  02/11/2023   Name:  Tracy Larsen   DOB:  06/12/1960   MRN:  [id number]  PCP:  Kaylee Partridge, MD    Chief Complaint: Dizziness (Hx of vertigo, this is worse. X Sunday evening 02/08/23. Pt says she has been having Nausea, dry heaving, dizziness, intense headache and sensitive to light. Tylenol  has not helped much. HA is more front/middle. )   History of Present Illness:  Tracy Larsen is a 63 y.o. very pleasant female patient who presents with the following:  Pt last seen by myself about 2 years ago- 4/22 Today is Wednesday. On Sunday she felt nauseated, thought it might be due to a scented candle.  The next am she was nauseated, dry heaving and coughing, thought she might have a GI bug Monday afternoon she noted headache and it has continued - still present now Vertigo sx started Monday evening; like vertigo she had in the past but not typically associated with HA  Yesterday early am she was very dizzy, no falls however She still has a HA now- will wax and wane somewhat but does not go away.  Tylenol  this am- minimally helpful +  Light sensitive Still nauseated now but no further vomiting No diarrhea She may feel sensation of movement c/w vertigo, and some lightheadedness as well   Pt notes she did NOT actually have a TIA in the past, but that she was worked up and this was all normal. She states this was a visual issue instead- ?type of migraine  No vision change this time  Patient Active Problem List   Diagnosis Date Noted   Lumbar radiculopathy 12/02/2021   Vertigo 12/09/2018   Transient visual disturbance, bilateral 07/02/2015   IBS (irritable bowel syndrome) 04/22/2013   Mitral valve prolapse 09/24/2012   TIA (transient ischemic attack) 09/24/2012    Past Medical History:  Diagnosis Date   Allergy    Arthritis     Patient denies.    Asthma    Endometriosis    GERD (gastroesophageal reflux disease)    Herpes    HSV (herpes simplex virus) infection    IBS (irritable bowel syndrome)    Insomnia    Mitral valve prolapse    UTI (urinary tract infection)    chronic history r/t bladder reflux which was repaired,    Past Surgical History:  Procedure Laterality Date   BLADDER REPAIR     AGE 63   NASAL SINUS SURGERY     PELVIC LAPAROSCOPY     X 5   URETHRAL STRICTURE DILATATION      Social History   Tobacco Use   Smoking status: Former    Current packs/day: 0.00    Average packs/day: 0.3 packs/day for 20.0 years (5.0 ttl pk-yrs)    Types: Cigarettes    Start date: 01/27/1981    Quit date: 01/27/2001    Years since quitting: 22.0   Smokeless tobacco: Never  Vaping Use   Vaping status: Never Used  Substance Use Topics   Alcohol use: Yes    Alcohol/week: 1.0 standard drink of alcohol    Types: 1 Glasses of wine per week    Comment: rarely   Drug use: No    Family History  Problem Relation Age of Onset   Cancer Mother        VULVAR CANCER  Heart disease Father        MI in his 53s   Cancer Brother        SARCOMA   Heart disease Brother        WPW   Diabetes Paternal Grandmother    Colon cancer Neg Hx     Allergies  Allergen Reactions   Alfuzosin Hcl Er Other (See Comments)    And changes in heart rate   Azithromycin  Other (See Comments)    GI Issues/IBS Exacerbation   Breo Ellipta  [Fluticasone  Furoate-Vilanterol] Hives   Entex Other (See Comments)    Tongue peeling    Erythromycin Diarrhea   Promethazine Other (See Comments)    Made butt feel like it was on fire -- Suppository     Singulair [Montelukast] Hives   Stadol [Butorphanol] Other (See Comments)    dizziness   Tamiflu [Oseltamivir Phosphate] Nausea And Vomiting   Zofran  [Ondansetron  Hcl] Nausea Only and Other (See Comments)    Makes nausea much worse and dizziness    Augmentin [Amoxicillin -Pot Clavulanate]  Other (See Comments)    GI issues/IBS Exacerbation    Ciprofloxacin  Other (See Comments)    GI issues/IBS Exacerbation   Levofloxacin Other (See Comments)    GI issues/IBS Exacerbation   Moxifloxacin Hcl In Nacl Other (See Comments)    GI issues/IBS Exacerbation   Sulfa Antibiotics Rash   Tetracyclines & Related Rash    Medication list has been reviewed and updated.  Current Outpatient Medications on File Prior to Visit  Medication Sig Dispense Refill   estradiol  (ESTRACE ) 1 MG tablet Take 1 mg by mouth daily.     hydrOXYzine  (ATARAX ) 10 MG tablet Take 1 tablet (10 mg total) by mouth 3 (three) times daily as needed for itching. 20 tablet 0   medroxyPROGESTERone (PROVERA) 2.5 MG tablet Take 2.5 mg by mouth daily.     omeprazole  (PRILOSEC) 40 MG capsule TAKE 1 CAPSULE BY MOUTH EVERY DAY 30 capsule 5   No current facility-administered medications on file prior to visit.    Review of Systems:  As per HPI- otherwise negative.   Physical Examination: Vitals:   02/11/23 1451  BP: 118/80  Pulse: 72  Resp: 18  Temp: 98.3 F (36.8 C)  SpO2: 98%   Vitals:   02/11/23 1451  Weight: 214 lb (97.1 kg)  Height: 5\' 6"  (1.676 m)   Body mass index is 34.54 kg/m. Ideal Body Weight: Weight in (lb) to have BMI = 25: 154.6  GEN: no acute distress. Obese, does not appear to feel well. Wearing sunglasses  HEENT: Atraumatic, Normocephalic.  Bilateral TM wnl, oropharynx normal.  PEERL,EOMI.  Very sensitive to light  Ears and Nose: No external deformity. CV: RRR, No M/G/R. No JVD. No thrill. No extra heart sounds. PULM: CTA B, no wheezes, crackles, rhonchi. No retractions. No resp. distress. No accessory muscle use. ABD: S, NT, ND EXTR: No c/c/e PSYCH: Normally interactive. Conversant.  Normal strength, sensation and DTR of all limbs  Assessment and Plan: Severe headache Pt seen today with severe HA for 4 days, vertigo, light sensitivity.  May be a migraine but sx are a bit unusual  for her. Will have her seen  in the ER now for further eval- may need labs, perhaps a CT Appreciate ER care of this patient  Signed Gates Kasal, MD

## 2023-02-11 NOTE — ED Provider Notes (Signed)
 [facility] Provider Note   CSN: [id number] Arrival date & time: 02/11/23  1526     History  Chief Complaint  Patient presents with   Dizziness    Tracy Larsen is a 63 y.o. female.  Pt is a 63 yo female with pmhx significant for endometriosis, gerd, IBS, and uti.  Pt burned a candle this past weekend and sx started after that.  She developed a headache and dizziness.  She did see pcp today who recommended that she come to the ED for further eval.  No fever.         Home Medications Prior to Admission medications   Medication Sig Start Date End Date Taking? Authorizing Provider  meclizine  (ANTIVERT ) 25 MG tablet Take 1 tablet (25 mg total) by mouth 3 (three) times daily as needed for dizziness. 02/11/23  Yes Sueellen Emery, MD  metoCLOPramide  (REGLAN ) 10 MG tablet Take 1 tablet (10 mg total) by mouth every 6 (six) hours as needed for nausea. 02/11/23  Yes Sueellen Emery, MD  estradiol  (ESTRACE ) 1 MG tablet Take 1 mg by mouth daily.    [provider]  hydrOXYzine  (ATARAX ) 10 MG tablet Take 1 tablet (10 mg total) by mouth 3 (three) times daily as needed for itching. 12/31/22   Copland, Jessica C, MD  medroxyPROGESTERone (PROVERA) 2.5 MG tablet Take 2.5 mg by mouth daily.    [provider]  omeprazole  (PRILOSEC) 40 MG capsule TAKE 1 CAPSULE BY MOUTH EVERY DAY 08/08/22   Copland, Jessica C, MD      Allergies    Alfuzosin hcl er, Azithromycin , Breo ellipta  [fluticasone  furoate-vilanterol], Entex, Erythromycin, Promethazine, Singulair [montelukast], Stadol [butorphanol], Tamiflu [oseltamivir phosphate], Zofran  [ondansetron  hcl], Augmentin [amoxicillin -pot clavulanate], Ciprofloxacin , Levofloxacin, Moxifloxacin hcl in nacl, Sulfa antibiotics, and Tetracyclines & related    Review of Systems   Review of Systems  Gastrointestinal:  Positive for nausea.  Neurological:  Positive for dizziness and headaches.  All other  systems reviewed and are negative.   Physical Exam Updated Vital Signs BP 126/77   Pulse 63   Temp 98 F (36.7 C) (Oral)   Resp 20   Ht 5\' 6"  (1.676 m)   Wt 96.2 kg   LMP 09/27/2011   SpO2 100%   BMI 34.22 kg/m  Physical Exam Vitals and nursing note reviewed.  Constitutional:      Appearance: Normal appearance.  HENT:     Head: Normocephalic and atraumatic.     Right Ear: External ear normal.     Left Ear: External ear normal.     Nose: Nose normal.     Mouth/Throat:     Mouth: Mucous membranes are dry.  Eyes:     Extraocular Movements: Extraocular movements intact.     Conjunctiva/sclera: Conjunctivae normal.     Pupils: Pupils are equal, round, and reactive to light.  Cardiovascular:     Rate and Rhythm: Normal rate and regular rhythm.     Pulses: Normal pulses.     Heart sounds: Normal heart sounds.  Pulmonary:     Effort: Pulmonary effort is normal.     Breath sounds: Normal breath sounds.  Abdominal:     General: Abdomen is flat. Bowel sounds are normal.     Palpations: Abdomen is soft.  Musculoskeletal:        General: Normal range of motion.     Cervical back: Normal range of motion and neck supple.  Skin:  General: Skin is warm.     Capillary Refill: Capillary refill takes less than 2 seconds.  Neurological:     General: No focal deficit present.     Mental Status: She is alert and oriented to person, place, and time.  Psychiatric:        Mood and Affect: Mood normal.        Behavior: Behavior normal.     ED Results / Procedures / Treatments   Labs (all labs ordered are listed, but only abnormal results are displayed) Labs Reviewed  COMPREHENSIVE METABOLIC PANEL - Abnormal; Notable for the following components:      Result Value   Calcium 8.8 (*)    All other components within normal limits  CBC WITH DIFFERENTIAL/PLATELET    EKG EKG Interpretation Date/Time:  Wednesday February 11 2023 15:39:12 EST Ventricular Rate:  84 PR  Interval:  132 QRS Duration:  80 QT Interval:  396 QTC Calculation: 467 R Axis:   46  Text Interpretation: Normal sinus rhythm Low voltage QRS Cannot rule out Anterior infarct , age undetermined Abnormal ECG When compared with ECG of 23-Feb-2019 06:20, PREVIOUS ECG IS PRESENT No significant change since last tracing Confirmed by Sueellen Emery [phone]) on 02/11/2023 8:21:32 PM  Radiology CT Head Wo Contrast Result Date: 02/11/2023 CLINICAL DATA:  Vertigo, nausea EXAM: CT HEAD WITHOUT CONTRAST TECHNIQUE: Contiguous axial images were obtained from the base of the skull through the vertex without intravenous contrast. RADIATION DOSE REDUCTION: This exam was performed according to the departmental dose-optimization program which includes automated exposure control, adjustment of the mA and/or kV according to patient size and/or use of iterative reconstruction technique. COMPARISON:  No prior CT available, correlation is made with 07/04/2015 MRI head FINDINGS: Brain: No evidence of acute infarction, hemorrhage, mass, mass effect, or midline shift. No hydrocephalus or extra-axial fluid collection. Partial empty sella. Normal craniocervical junction. Cerebral volume is within normal limits for age. Vascular: No hyperdense vessel. Skull: Negative for fracture or focal lesion. Sinuses/Orbits: No acute finding. Other: The mastoid air cells are well aerated. IMPRESSION: No acute intracranial process. Electronically Signed   By: Zoila Hines M.D.   On: 02/11/2023 17:29    Procedures Procedures    Medications Ordered in ED Medications  ketorolac  (TORADOL ) 30 MG/ML injection 30 mg (30 mg Intravenous Given 02/11/23 2059)  sodium chloride  0.9 % bolus 1,000 mL (1,000 mLs Intravenous New Bag/Given 02/11/23 2056)  diphenhydrAMINE  (BENADRYL ) injection 25 mg (25 mg Intravenous Given 02/11/23 2059)  metoCLOPramide  (REGLAN ) injection 10 mg (10 mg Intravenous Given 02/11/23 2059)    ED Course/ Medical Decision Making/  A&P                                 Medical Decision Making Amount and/or Complexity of Data Reviewed Labs: ordered. Radiology: ordered.  Risk Prescription drug management.   This patient presents to the ED for concern of dizziness, headache, this involves an extensive number of treatment options, and is a complaint that carries with it a high risk of complications and morbidity.  The differential diagnosis includes migraine, head bleed, infection, electrolyte abn, dehydration   Co morbidities that complicate the patient evaluation  endometriosis, gerd, IBS, and uti.   Additional history obtained:  Additional history obtained from epic chart review External records from outside source obtained and reviewed including husband   Lab Tests:  I Ordered, and personally interpreted labs.  The pertinent  results include:  cbc nl, cmp nl   Imaging Studies ordered:  I ordered imaging studies including ct head  I independently visualized and interpreted imaging which showed No acute intracranial process.  I agree with the radiologist interpretation   Cardiac Monitoring:  The patient was maintained on a cardiac monitor.  I personally viewed and interpreted the cardiac monitored which showed an underlying rhythm of: nsr   Medicines ordered and prescription drug management:  I ordered medication including ivfs, reglan , toradol , benadryl   for sx  Reevaluation of the patient after these medicines showed that the patient improved I have reviewed the patients home medicines and have made adjustments as needed   Test Considered:  ct   Critical Interventions:  Meds/fluids  Problem List / ED Course:  Migraine/vertigo:  sx much improved.  Pt said she feels back to normal.  She is stable for d/c. Return if worse.  F/u with pcp.   Reevaluation:  After the interventions noted above, I reevaluated the patient and found that they have :improved   Social Determinants of  Health:  Lives at home   Dispostion:  After consideration of the diagnostic results and the patients response to treatment, I feel that the patent would benefit from discharge with outpatient f/u.          Final Clinical Impression(s) / ED Diagnoses Final diagnoses:  Dehydration  Vertigo  Other migraine without status migrainosus, not intractable    Rx / DC Orders ED Discharge Orders          Ordered    meclizine  (ANTIVERT ) 25 MG tablet  3 times daily PRN        02/11/23 2156    metoCLOPramide  (REGLAN ) 10 MG tablet  Every 6 hours PRN        02/11/23 2156              Alyne Martinson, MD 02/11/23 2200

## 2023-02-11 NOTE — Telephone Encounter (Signed)
 Copied from CRM [id number]. Topic: Clinical - Red Word Triage >> Feb 11, 2023  8:55 AM Isabell A wrote: Red Word that prompted transfer to Nurse Triage: Patient experiencing vertigo episode since Sunday, states she's been feeling nausea and vomiting before the vertigo actually kicked in on Monday. The spinning has settles but can't look in directions without feeling dizzy.  Chief Complaint: vertigo Symptoms: prior room spinning, dizziness, stumbling when walking, nausea, headache, lightheadedness Frequency: continual Pertinent Negatives: Patient denies numbness, weakness, SOB, chest pain Disposition: [] 911 / [] ED /[] Urgent Care (no appt availability in office) / [] Appointment(In office/virtual)/ []  [facility]/ [] Home Care/ [x] Refused Recommended Disposition /[]  Mobile Bus/ []  Follow-up with PCP Additional Notes: Pt reporting she has had "vertigo for years" but this episode has been different from her usual in that it started with "wanting to throw up" and lingering much longer than normal, "almost a week" when typically just a couple days. Pt reporting she feels dizziness at rest even if "open eyes and move eyes side to side or up and down," the "worst part of room spinning is gone but if turn head certain ways or suddenly, still having nausea and lightheadedness." Pt reporting she "cannot drive, this is 3rd day out of work," and "looking at screen or phone light," and "movement in general" is "not good," "just trying to get up and walk very lightheaded." Pt also reporting headache that is now "5-6" out of 10 pain even though took tylenol  an hour ago. Pt confirms no weakness or numbness. Please advise - pt confirms NO hx of TIA, reporting that she "went in to rule out TIA but never had one, that got put on there and nobody will take it off" her chart, "years ago" went to ED for "visual disturbance" and someone put TIA on her medical history in ERROR - please advise. Advised pt go to ED  for symptoms since not her usual. Pt refusing at this time, requesting "anything new to help" nip this lingering vertigo in the bud, pt requesting appt today with notification of appt as soon ahead of time as possible since have to wait for husband to return home and then get to office, needs at least an hour's notice - please advise. Advised go to ED for any worsening, pt verbalized understanding.  Reason for Disposition  [1] Dizziness (vertigo) present now AND [2] age > 32   (Exception: Prior doctor or NP/PA evaluation for this AND no different/worse than usual.)  Answer Assessment - Initial Assessment Questions 1. DESCRIPTION: "Describe your dizziness."     Vertigo for years and Sunday night started having nausea, went to bed, got up with wanting to throw up, feeling like had a virus, lot of dry heaving and trying to throw up, later that day went into dizziness and headache, vertigo by Monday evening, worst part of room spinning is gone but if turn head certain ways or suddenly, still having nausea and lightheadedness, pretty normal for vertigo to linger but lingering much longer than normal 2. VERTIGO: "Do you feel like either you or the room is spinning or tilting?"      yes 3. LIGHTHEADED: "Do you feel lightheaded?" (e.g., somewhat faint, woozy, weak upon standing)     yes 4. SEVERITY: "How bad is it?"  "Can you walk?"   - MILD: Feels slightly dizzy and unsteady, but is walking normally.   - MODERATE: Feels unsteady when walking, but not falling; interferes with normal activities (e.g., school, work).   -  SEVERE: Unable to walk without falling, or requires assistance to walk without falling.     Stumbling with walking 5. ONSET:  "When did the dizziness begin?"     Sunday 6. AGGRAVATING FACTORS: "Does anything make it worse?" (e.g., standing, change in head position)     Dizziness at rest, open eyes and move eyes side to side or up and down, movement of head or walking 7. CAUSE: "What do  you think is causing the dizziness?"     Hx vertigo 8. RECURRENT SYMPTOM: "Have you had dizziness before?" If Yes, ask: "When was the last time?" "What happened that time?"     Yes but lingering longer than usual and does not usually start with wanting to throw up 9. OTHER SYMPTOMS: "Do you have any other symptoms?" (e.g., headache, weakness, numbness, vomiting, earache)     5-6/10 headache 1 hr after taking tylenol , nausea  Protocols used: Dizziness - Vertigo-A-AH

## 2023-02-19 DIAGNOSIS — N951 Menopausal and female climacteric states: Secondary | ICD-10-CM | POA: Diagnosis not present

## 2023-02-19 DIAGNOSIS — Z79890 Hormone replacement therapy (postmenopausal): Secondary | ICD-10-CM | POA: Diagnosis not present

## 2023-02-19 DIAGNOSIS — N809 Endometriosis, unspecified: Secondary | ICD-10-CM | POA: Diagnosis not present

## 2023-02-19 DIAGNOSIS — R102 Pelvic and perineal pain: Secondary | ICD-10-CM | POA: Diagnosis not present

## 2023-02-23 ENCOUNTER — Encounter: Payer: Self-pay | Admitting: Family Medicine

## 2023-02-24 ENCOUNTER — Telehealth: Payer: Self-pay | Admitting: Cardiology

## 2023-02-24 ENCOUNTER — Encounter: Payer: Self-pay | Admitting: Cardiology

## 2023-02-24 NOTE — Telephone Encounter (Signed)
Pt wants Dr Jacinto Halim to look at her EKG from the hospital

## 2023-02-25 NOTE — Telephone Encounter (Signed)
See my chart message

## 2023-03-06 DIAGNOSIS — K2100 Gastro-esophageal reflux disease with esophagitis, without bleeding: Secondary | ICD-10-CM | POA: Diagnosis not present

## 2023-03-06 DIAGNOSIS — K5900 Constipation, unspecified: Secondary | ICD-10-CM | POA: Diagnosis not present

## 2023-03-21 NOTE — Patient Instructions (Signed)
 It was good to see you today, I will be in touch with your blood work  Recommend COVID booster if none the last 6 months or so  Recommend 1200 mg of calcium daily from combo diet and food  Best of luck with all you have coming up

## 2023-03-21 NOTE — Progress Notes (Unsigned)
 [facility] [address], [address] [place], [place] [address] [phone]  Date:  03/25/2023   Name:  Tracy Larsen   DOB:  1960/12/10   MRN:  [id number]  PCP:  Pearline Cables, MD    Chief Complaint: No chief complaint on file.   History of Present Illness:  Tracy Larsen is a 63 y.o. very pleasant female patient who presents with the following:  Patient seen today for physical exam-history of back pain, vertigo, IBS, obesity Most recent visit with myself was in [place] that time she was ill with a severe headache, we ended up having her seen in the ER where she was treated for dehydration and migraine  COVID booster Tetanus booster Shingrix Mammogram due in April Pap smear updated 1 year ago Colon cancer screening-2018, 10-year follow-up recommended Flu shot up-to-date  She had some lab work in January, CMP, CBC only  HRT-estrogen and progesterone per GYN  Patient Active Problem List   Diagnosis Date Noted   Lumbar radiculopathy 12/02/2021   Vertigo 12/09/2018   Transient visual disturbance, bilateral 07/02/2015   IBS (irritable bowel syndrome) 04/22/2013   Mitral valve prolapse 09/24/2012   TIA (transient ischemic attack) 09/24/2012    Past Medical History:  Diagnosis Date   Allergy    Arthritis    Patient denies.    Asthma    Endometriosis    GERD (gastroesophageal reflux disease)    Herpes    HSV (herpes simplex virus) infection    IBS (irritable bowel syndrome)    Insomnia    Mitral valve prolapse    UTI (urinary tract infection)    chronic history r/t bladder reflux which was repaired,    Past Surgical History:  Procedure Laterality Date   BLADDER REPAIR     AGE 76   NASAL SINUS SURGERY     PELVIC LAPAROSCOPY     X 5   URETHRAL STRICTURE DILATATION      Social History   Tobacco Use   Smoking status: Former    Current packs/day: 0.00    Average packs/day: 0.3 packs/day  for 20.0 years (5.0 ttl pk-yrs)    Types: Cigarettes    Start date: 01/27/1981    Quit date: 01/27/2001    Years since quitting: 22.1   Smokeless tobacco: Never  Vaping Use   Vaping status: Never Used  Substance Use Topics   Alcohol use: Yes    Alcohol/week: 1.0 standard drink of alcohol    Types: 1 Glasses of wine per week    Comment: rarely   Drug use: No    Family History  Problem Relation Age of Onset   Cancer Mother        VULVAR CANCER   Heart disease Father        MI in his 85s   Cancer Brother        SARCOMA   Heart disease Brother        WPW   Diabetes Paternal Grandmother    Colon cancer Neg Hx     Allergies  Allergen Reactions   Alfuzosin Hcl Er Other (See Comments)    And changes in heart rate   Azithromycin Other (See Comments)    GI Issues/IBS Exacerbation   Breo Ellipta [Fluticasone Furoate-Vilanterol] Hives   Entex Other (See Comments)    Tongue peeling    Erythromycin Diarrhea   Promethazine Other (See Comments)  Made butt feel like it was on fire -- Suppository     Singulair [Montelukast] Hives   Stadol [Butorphanol] Other (See Comments)    dizziness   Tamiflu [Oseltamivir Phosphate] Nausea And Vomiting   Zofran [Ondansetron Hcl] Nausea Only and Other (See Comments)    Makes nausea much worse and dizziness    Augmentin [Amoxicillin-Pot Clavulanate] Other (See Comments)    GI issues/IBS Exacerbation    Ciprofloxacin Other (See Comments)    GI issues/IBS Exacerbation   Levofloxacin Other (See Comments)    GI issues/IBS Exacerbation   Moxifloxacin Hcl In Nacl Other (See Comments)    GI issues/IBS Exacerbation   Sulfa Antibiotics Rash   Tetracyclines & Related Rash    Medication list has been reviewed and updated.  Current Outpatient Medications on File Prior to Visit  Medication Sig Dispense Refill   estradiol (ESTRACE) 1 MG tablet Take 1 mg by mouth daily.     hydrOXYzine (ATARAX) 10 MG tablet Take 1 tablet (10 mg total) by mouth 3  (three) times daily as needed for itching. 20 tablet 0   meclizine (ANTIVERT) 25 MG tablet Take 1 tablet (25 mg total) by mouth 3 (three) times daily as needed for dizziness. 30 tablet 0   medroxyPROGESTERone (PROVERA) 2.5 MG tablet Take 2.5 mg by mouth daily.     metoCLOPramide (REGLAN) 10 MG tablet Take 1 tablet (10 mg total) by mouth every 6 (six) hours as needed for nausea. 30 tablet 0   omeprazole (PRILOSEC) 40 MG capsule TAKE 1 CAPSULE BY MOUTH EVERY DAY 30 capsule 5   predniSONE (DELTASONE) 50 MG tablet Take 1 tablet (50 mg total) by mouth daily with breakfast. 5 tablet 0   No current facility-administered medications on file prior to visit.    Review of Systems:  As per HPI- otherwise negative.   Physical Examination: There were no vitals filed for this visit. There were no vitals filed for this visit. There is no height or weight on file to calculate BMI. Ideal Body Weight:    GEN: no acute distress. HEENT: Atraumatic, Normocephalic.  Ears and Nose: No external deformity. CV: RRR, No M/G/R. No JVD. No thrill. No extra heart sounds. PULM: CTA B, no wheezes, crackles, rhonchi. No retractions. No resp. distress. No accessory muscle use. ABD: S, NT, ND, +BS. No rebound. No HSM. EXTR: No c/c/e PSYCH: Normally interactive. Conversant.    Assessment and Plan: *** Physical exam today.  Encouraged healthy diet and exercise routine Signed Abbe Amsterdam, MD

## 2023-03-25 ENCOUNTER — Ambulatory Visit (INDEPENDENT_AMBULATORY_CARE_PROVIDER_SITE_OTHER): Payer: BC Managed Care – PPO | Admitting: Family Medicine

## 2023-03-25 VITALS — BP 118/80 | HR 82 | Temp 97.8°F | Resp 18 | Ht 66.0 in | Wt 214.2 lb

## 2023-03-25 DIAGNOSIS — Z0000 Encounter for general adult medical examination without abnormal findings: Secondary | ICD-10-CM | POA: Diagnosis not present

## 2023-03-25 DIAGNOSIS — E559 Vitamin D deficiency, unspecified: Secondary | ICD-10-CM | POA: Diagnosis not present

## 2023-03-25 DIAGNOSIS — Z131 Encounter for screening for diabetes mellitus: Secondary | ICD-10-CM

## 2023-03-25 DIAGNOSIS — Z23 Encounter for immunization: Secondary | ICD-10-CM | POA: Diagnosis not present

## 2023-03-25 DIAGNOSIS — Z13220 Encounter for screening for lipoid disorders: Secondary | ICD-10-CM | POA: Diagnosis not present

## 2023-03-25 DIAGNOSIS — Z1329 Encounter for screening for other suspected endocrine disorder: Secondary | ICD-10-CM

## 2023-03-25 DIAGNOSIS — E538 Deficiency of other specified B group vitamins: Secondary | ICD-10-CM

## 2023-03-25 DIAGNOSIS — R5383 Other fatigue: Secondary | ICD-10-CM | POA: Diagnosis not present

## 2023-03-26 LAB — LIPID PANEL
Cholesterol: 188 mg/dL (ref 0–200)
HDL: 62.4 mg/dL (ref >39.00)
LDL Cholesterol: 106 mg/dL — ABNORMAL HIGH (ref 0–99)
NonHDL: 125.71
Total CHOL/HDL Ratio: 3
Triglycerides: 99 mg/dL (ref 0.0–149.0)
VLDL: 19.8 mg/dL (ref 0.0–40.0)

## 2023-03-26 LAB — VITAMIN D 25 HYDROXY (VIT D DEFICIENCY, FRACTURES): VITD: 25.22 ng/mL — ABNORMAL LOW (ref 30.00–100.00)

## 2023-03-26 LAB — TSH: TSH: 1.88 u[IU]/mL (ref 0.35–5.50)

## 2023-03-26 LAB — VITAMIN B12: Vitamin B-12: 131 pg/mL — ABNORMAL LOW (ref 211–911)

## 2023-03-26 LAB — HEMOGLOBIN A1C: Hgb A1c MFr Bld: 6 % (ref 4.6–6.5)

## 2023-03-27 ENCOUNTER — Encounter: Payer: Self-pay | Admitting: Family Medicine

## 2023-03-27 NOTE — Addendum Note (Signed)
 Addended by: Pearline Cables on: 03/27/2023 05:55 PM   Modules accepted: Orders

## 2023-04-16 DIAGNOSIS — N950 Postmenopausal bleeding: Secondary | ICD-10-CM | POA: Diagnosis not present

## 2023-04-16 DIAGNOSIS — R891 Abnormal level of hormones in specimens from other organs, systems and tissues: Secondary | ICD-10-CM | POA: Diagnosis not present

## 2023-04-16 DIAGNOSIS — N809 Endometriosis, unspecified: Secondary | ICD-10-CM | POA: Diagnosis not present

## 2023-04-16 DIAGNOSIS — N959 Unspecified menopausal and perimenopausal disorder: Secondary | ICD-10-CM | POA: Diagnosis not present

## 2023-04-16 DIAGNOSIS — R9389 Abnormal findings on diagnostic imaging of other specified body structures: Secondary | ICD-10-CM | POA: Diagnosis not present

## 2023-05-21 ENCOUNTER — Telehealth: Payer: Self-pay

## 2023-05-21 DIAGNOSIS — N840 Polyp of corpus uteri: Secondary | ICD-10-CM | POA: Diagnosis not present

## 2023-05-21 NOTE — Telephone Encounter (Signed)
 Needs appt please.

## 2023-05-21 NOTE — Telephone Encounter (Signed)
 Copied from CRM [id number]. Topic: Clinical - Request for Lab/Test Order >> May 21, 2023  2:18 PM [name] wrote: Reason for CRM: Patient was wondering if she could have an MRI on her pelvis. There is radiating pain going up to her groin area. She wants to make sure it's not a hernia.

## 2023-05-22 ENCOUNTER — Ambulatory Visit: Admitting: Physician Assistant

## 2023-05-22 NOTE — Telephone Encounter (Signed)
 Appt scheduled 05/22/23

## 2023-05-26 NOTE — Progress Notes (Unsigned)
 [facility] [address], [address] [place], [place] [address] [phone]  Date:  05/28/2023   Name:  Tracy Larsen   DOB:  06/30/1960   MRN:  [id number]  PCP:  Kaylee Partridge, MD    Chief Complaint: Flank Pain (R side pain onset "2023" pain is usually low but will radiate up "comes and goes"/It has been confirmed pt has a uterine polyp /NO CANCER!!!!!!!!!!!!!!!!!"/Pt would like a MRI)   History of Present Illness:  Tracy Larsen is a 63 y.o. very pleasant female patient who presents with the following:  Pt seen today with concern of pain in right side - history of back pain, vertigo, IBS, obesity  Last seen by myself in February  She had a D/C and polyp removal a week ago per Dr Finis Hugger The polyp was benign and very small   Pt has noted pain in her right side from just over her hip to the umbilicus- she has noted this for about 18 months It will come and go No pattern that she can see Having a BM does not help  Always in the same spot Not worse with movement or eating No vomiting No fevers that she has noted She had health evidence uterine polyp might get rid of her pain but as it did not she decided to come in to be seen  She did a see her GI doc - they also did not see an explantation for her pain   She went to the ER 1/24 and had a CT scan- they thought it was endometriosis She had a CT scan 01/2022: IMPRESSION: No bowel obstruction, free air or free fluid. Normal appendix. Scattered stool. Moderate atrophy of the right kidney.   Patient Active Problem List   Diagnosis Date Noted   Lumbar radiculopathy 12/02/2021   Vertigo 12/09/2018   Transient visual disturbance, bilateral 07/02/2015   IBS (irritable bowel syndrome) 04/22/2013   Mitral valve prolapse 09/24/2012   TIA (transient ischemic attack) 09/24/2012    Past Medical History:  Diagnosis Date   Allergy    Arthritis    Patient denies.     Asthma    Endometriosis    GERD (gastroesophageal reflux disease)    Herpes    HSV (herpes simplex virus) infection    IBS (irritable bowel syndrome)    Insomnia    Mitral valve prolapse    UTI (urinary tract infection)    chronic history r/t bladder reflux which was repaired,    Past Surgical History:  Procedure Laterality Date   BLADDER REPAIR     AGE 25   NASAL SINUS SURGERY     PELVIC LAPAROSCOPY     X 5   URETHRAL STRICTURE DILATATION      Social History   Tobacco Use   Smoking status: Former    Current packs/day: 0.00    Average packs/day: 0.3 packs/day for 20.0 years (5.0 ttl pk-yrs)    Types: Cigarettes    Start date: 01/27/1981    Quit date: 01/27/2001    Years since quitting: 22.3   Smokeless tobacco: Never  Vaping Use   Vaping status: Never Used  Substance Use Topics   Alcohol use: Yes    Alcohol/week: 1.0 standard drink of alcohol    Types: 1 Glasses of wine per week    Comment: rarely   Drug use: No    Family History  Problem Relation Age  of Onset   Cancer Mother        VULVAR CANCER   Heart disease Father        MI in his 37s   Cancer Brother        SARCOMA   Heart disease Brother        WPW   Diabetes Paternal Grandmother    Colon cancer Neg Hx     Allergies  Allergen Reactions   Alfuzosin Hcl Er Other (See Comments)    And changes in heart rate   Azithromycin  Other (See Comments)    GI Issues/IBS Exacerbation   Breo Ellipta  [Fluticasone  Furoate-Vilanterol] Hives   Effexor [Venlafaxine]    Entex Other (See Comments)    Tongue peeling    Erythromycin Diarrhea   Promethazine Other (See Comments)    Made butt feel like it was on fire -- Suppository     Reglan  [Metoclopramide ] Other (See Comments)    Agitation    Singulair [Montelukast] Hives   Stadol [Butorphanol] Other (See Comments)    dizziness   Tamiflu [Oseltamivir Phosphate] Nausea And Vomiting   Zofran  [Ondansetron  Hcl] Nausea Only and Other (See Comments)    Pt notes she can  take IV no problem,  by mouth once caused nausea    Augmentin [Amoxicillin -Pot Clavulanate] Other (See Comments)    GI issues/IBS Exacerbation    Ciprofloxacin  Other (See Comments)    GI issues/IBS Exacerbation   Levofloxacin Other (See Comments)    GI issues/IBS Exacerbation   Moxifloxacin Hcl In Nacl Other (See Comments)    GI issues/IBS Exacerbation   Sulfa Antibiotics Rash   Tetracyclines & Related Rash    Medication list has been reviewed and updated.  Current Outpatient Medications on File Prior to Visit  Medication Sig Dispense Refill   famotidine (PEPCID) 20 MG tablet Take 20 mg by mouth daily.     omeprazole  (PRILOSEC) 40 MG capsule TAKE 1 CAPSULE BY MOUTH EVERY DAY 30 capsule 5   metoCLOPramide  (REGLAN ) 10 MG tablet Take 1 tablet (10 mg total) by mouth every 6 (six) hours as needed for nausea. 30 tablet 0   No current facility-administered medications on file prior to visit.    Review of Systems:  As per HPI- otherwise negative.   Physical Examination: Vitals:   05/28/23 1543  BP: 130/72  Pulse: 65  SpO2: 100%   Vitals:   05/28/23 1543  Weight: 211 lb (95.7 kg)  Height: 5\' 6"  (1.676 m)   Body mass index is 34.06 kg/m. Ideal Body Weight: Weight in (lb) to have BMI = 25: 154.6  GEN: no acute distress.  Obese, looks well HEENT: Atraumatic, Normocephalic.  Ears and Nose: No external deformity. CV: RRR, No M/G/R. No JVD. No thrill. No extra heart sounds. PULM: CTA B, no wheezes, crackles, rhonchi. No retractions. No resp. distress. No accessory muscle use. ABD: S, NT, ND, +BS. No rebound. No HSM.  She has poorly localized last tenderness over the lowest right quadrant, right pelvis.  Examined patient standing, I cannot appreciate any hernia. EXTR: No c/c/e PSYCH: Normally interactive. Conversant.  Right hip: she is tender over the hip joint, but not tender with ROM of the hip   Assessment and Plan: Pain in joint of right hip - Plan: DG Hip Unilat W OR W/O  Pelvis 2-3 Views Right  Abdominal pain, right lower quadrant  Patient seen today with concern of right lower quadrant/right hip/right pelvic pain for about 18 months.  She has  seen her gastroenterologist, had a CT scan, and recently had an endometrial polyp removal and D&C.  However her pain is persistent-she is concerned and would like to look further.  As of yet she has not had any imaging of her hip joint-we will start with this today and then follow-up as needed  Of note patient endorses a family history of appendiceal cancer which has her concerned  Signed Gates Kasal, MD  Addendum 5/5 if, I spoke with radiologist about CT versus MRI for assessment of appendiceal cancer in particular and abdominal pain.  Per radiologist, CT is preferred over MRI in nonpregnant patients, it has greater sensitivity and specificity.  I will pass this information along to the patient

## 2023-05-28 ENCOUNTER — Encounter: Payer: Self-pay | Admitting: Family Medicine

## 2023-05-28 ENCOUNTER — Ambulatory Visit: Admitting: Family Medicine

## 2023-05-28 ENCOUNTER — Ambulatory Visit (HOSPITAL_BASED_OUTPATIENT_CLINIC_OR_DEPARTMENT_OTHER)
Admission: RE | Admit: 2023-05-28 | Discharge: 2023-05-28 | Disposition: A | Discharge status: Home / Self Care | Source: Ambulatory Visit | Attending: Family Medicine | Admitting: Family Medicine

## 2023-05-28 VITALS — BP 130/72 | HR 65 | Ht 66.0 in | Wt 211.0 lb

## 2023-05-28 DIAGNOSIS — G8929 Other chronic pain: Secondary | ICD-10-CM | POA: Diagnosis not present

## 2023-05-28 DIAGNOSIS — M25551 Pain in right hip: Secondary | ICD-10-CM | POA: Insufficient documentation

## 2023-05-28 DIAGNOSIS — R1031 Right lower quadrant pain: Secondary | ICD-10-CM | POA: Diagnosis not present

## 2023-05-28 NOTE — Patient Instructions (Signed)
 Let's get films of your hip today, and I will speak to radiology about the next best step

## 2023-06-01 ENCOUNTER — Encounter: Payer: Self-pay | Admitting: Family Medicine

## 2023-06-02 ENCOUNTER — Encounter: Payer: Self-pay | Admitting: Family Medicine

## 2023-06-02 DIAGNOSIS — R1031 Right lower quadrant pain: Secondary | ICD-10-CM

## 2023-06-10 DIAGNOSIS — N760 Acute vaginitis: Secondary | ICD-10-CM | POA: Diagnosis not present

## 2023-06-11 DIAGNOSIS — R1031 Right lower quadrant pain: Secondary | ICD-10-CM | POA: Diagnosis not present

## 2023-06-11 DIAGNOSIS — R195 Other fecal abnormalities: Secondary | ICD-10-CM | POA: Diagnosis not present

## 2023-06-11 DIAGNOSIS — K219 Gastro-esophageal reflux disease without esophagitis: Secondary | ICD-10-CM | POA: Diagnosis not present

## 2023-06-15 ENCOUNTER — Other Ambulatory Visit: Payer: Self-pay | Admitting: Family Medicine

## 2023-06-15 MED ORDER — OMEPRAZOLE 40 MG PO CPDR: 40.0000 mg | DELAYED_RELEASE_CAPSULE | Freq: Every day | ORAL | 3 refills | Status: AC

## 2023-06-15 MED ORDER — OMEPRAZOLE 40 MG PO CPDR: 40.0000 mg | DELAYED_RELEASE_CAPSULE | Freq: Every day | ORAL | 2 refills | Status: DC

## 2023-06-15 NOTE — Telephone Encounter (Signed)
 Copied from CRM [id number]. Topic: Clinical - Medication Refill >> Jun 15, 2023 11:23 AM Clydene Darner H wrote: Medication: omeprazole  (PRILOSEC)  Has the patient contacted their pharmacy? No  Patient stated she unable to get in contact with someone at the pharmacy regarding this rx refill.  Agent: If no, request that the patient contact the pharmacy for the refill. If patient does not wish to contact the pharmacy document the reason why and proceed with request.) (Agent: If yes, when and what did the pharmacy advise?)  This is the patient's preferred pharmacy:  [id number] [place], [place] - [address] [place] [address] Phone: [phone] Fax: [phone]  Is this the correct pharmacy for this prescription? Yes If no, delete pharmacy and type the correct one.   Has the prescription been filled recently? No  Is the patient out of the medication? Yes 5-6 tablets left    Has the patient been seen for an appointment in the last year OR does the patient have an upcoming appointment? Yes  Can we respond through MyChart? Yes  Agent: Please be advised that Rx refills may take up to 3 business days. We ask that you follow-up with your pharmacy.

## 2023-06-16 DIAGNOSIS — Z1231 Encounter for screening mammogram for malignant neoplasm of breast: Secondary | ICD-10-CM | POA: Diagnosis not present

## 2023-06-16 DIAGNOSIS — Z124 Encounter for screening for malignant neoplasm of cervix: Secondary | ICD-10-CM | POA: Diagnosis not present

## 2023-06-16 DIAGNOSIS — Z6833 Body mass index (BMI) 33.0-33.9, adult: Secondary | ICD-10-CM | POA: Diagnosis not present

## 2023-06-16 DIAGNOSIS — Z1151 Encounter for screening for human papillomavirus (HPV): Secondary | ICD-10-CM | POA: Diagnosis not present

## 2023-06-16 DIAGNOSIS — E569 Vitamin deficiency, unspecified: Secondary | ICD-10-CM | POA: Diagnosis not present

## 2023-06-16 DIAGNOSIS — Z01419 Encounter for gynecological examination (general) (routine) without abnormal findings: Secondary | ICD-10-CM | POA: Diagnosis not present

## 2023-06-23 ENCOUNTER — Encounter: Payer: Self-pay | Admitting: Family Medicine

## 2023-06-23 ENCOUNTER — Other Ambulatory Visit: Payer: Self-pay | Admitting: Obstetrics and Gynecology

## 2023-06-23 DIAGNOSIS — R928 Other abnormal and inconclusive findings on diagnostic imaging of breast: Secondary | ICD-10-CM

## 2023-06-25 DIAGNOSIS — K317 Polyp of stomach and duodenum: Secondary | ICD-10-CM | POA: Diagnosis not present

## 2023-06-25 DIAGNOSIS — R12 Heartburn: Secondary | ICD-10-CM | POA: Diagnosis not present

## 2023-06-25 DIAGNOSIS — K449 Diaphragmatic hernia without obstruction or gangrene: Secondary | ICD-10-CM | POA: Diagnosis not present

## 2023-06-25 DIAGNOSIS — K2950 Unspecified chronic gastritis without bleeding: Secondary | ICD-10-CM | POA: Diagnosis not present

## 2023-06-29 ENCOUNTER — Telehealth: Payer: Self-pay

## 2023-06-29 NOTE — Telephone Encounter (Signed)
 Her CT is scheduled with [facility]- CT abd and pelvis with contrast  Called [facility] for her to troubleshoot  She wanted to get this CT as she is worried about appendiceal cancer  I spoke to CT tech who did say contrast is recommended for ruling out cancer of the appendix but of course she has the right to refuse contrast.  They do not have another oral contrast option for this procedure I called pt as requested and left a detailed message for her

## 2023-06-29 NOTE — Telephone Encounter (Signed)
 Copied from CRM [id number]. Topic: General - Other >> Jun 29, 2023 11:06 AM Baldo Levan wrote: Reason for CRM: Patient called in regarding the CT scan again, as she has not heard back. CAL stated the doctor or nurse will call her back as soon as they can. Patient wanted to let them know she may not be able to answer but it is okay to leave a detailed VM.

## 2023-06-29 NOTE — Telephone Encounter (Signed)
 Copied from CRM [id number]. Topic: General - Other >> Jun 29, 2023  8:33 AM Elle L wrote: Reason for CRM: The patient has a CT scan scheduled for tomorrow at 9. However, she was unaware that she had to drink barium. She states she cannot do it because she did not take off of work and the drive is too far. She states she has IBS as it is. She is requesting to see if the order can be changed to IV contrast or a drink when she gets there but not barium. The patient informed me that she has to cancel before 9 or she will be charged a fee and cannot wait for a call back so I reached out to the office at her request. While speaking with the office the patient disconnected the line. The office advised me to send a high priority CRM. I called the patient back to make her aware that the office will work on her request and give her a call back. The patient's call back number is [phone].

## 2023-06-30 ENCOUNTER — Ambulatory Visit
Admission: RE | Admit: 2023-06-30 | Discharge: 2023-06-30 | Disposition: A | Discharge status: Home / Self Care | Source: Ambulatory Visit | Attending: Family Medicine | Admitting: Family Medicine

## 2023-06-30 DIAGNOSIS — R1031 Right lower quadrant pain: Secondary | ICD-10-CM

## 2023-06-30 MED ORDER — IOPAMIDOL (ISOVUE-300) INJECTION 61%
100.0000 mL | Freq: Once | INTRAVENOUS | Status: AC | PRN
  Administered 2023-06-30: 100 mL via INTRAVENOUS

## 2023-07-01 ENCOUNTER — Encounter: Payer: Self-pay | Admitting: Family Medicine

## 2023-07-02 ENCOUNTER — Other Ambulatory Visit: Payer: Self-pay | Admitting: Obstetrics and Gynecology

## 2023-07-02 ENCOUNTER — Ambulatory Visit
Admission: RE | Admit: 2023-07-02 | Discharge: 2023-07-02 | Disposition: A | Discharge status: Home / Self Care | Source: Ambulatory Visit | Attending: Obstetrics and Gynecology

## 2023-07-02 DIAGNOSIS — R921 Mammographic calcification found on diagnostic imaging of breast: Secondary | ICD-10-CM | POA: Diagnosis not present

## 2023-07-02 DIAGNOSIS — R928 Other abnormal and inconclusive findings on diagnostic imaging of breast: Secondary | ICD-10-CM

## 2023-07-06 ENCOUNTER — Other Ambulatory Visit: Payer: Self-pay | Admitting: Obstetrics and Gynecology

## 2023-07-06 DIAGNOSIS — R921 Mammographic calcification found on diagnostic imaging of breast: Secondary | ICD-10-CM

## 2023-07-09 ENCOUNTER — Encounter

## 2023-07-09 ENCOUNTER — Encounter: Payer: Self-pay | Admitting: Family Medicine

## 2023-07-10 ENCOUNTER — Other Ambulatory Visit (HOSPITAL_COMMUNITY): Payer: Self-pay

## 2023-07-10 DIAGNOSIS — M25551 Pain in right hip: Secondary | ICD-10-CM | POA: Diagnosis not present

## 2023-07-10 MED ORDER — METHYLPREDNISOLONE 4 MG PO TBPK
ORAL_TABLET | ORAL | 0 refills | Status: DC
  Filled 2023-07-10: qty 21, 6d supply, fill #0

## 2023-07-22 ENCOUNTER — Other Ambulatory Visit (HOSPITAL_COMMUNITY): Payer: Self-pay

## 2023-08-03 DIAGNOSIS — R079 Chest pain, unspecified: Secondary | ICD-10-CM | POA: Diagnosis not present

## 2023-08-03 DIAGNOSIS — I340 Nonrheumatic mitral (valve) insufficiency: Secondary | ICD-10-CM | POA: Diagnosis not present

## 2023-08-03 DIAGNOSIS — K589 Irritable bowel syndrome without diarrhea: Secondary | ICD-10-CM | POA: Diagnosis not present

## 2023-08-03 DIAGNOSIS — R0602 Shortness of breath: Secondary | ICD-10-CM | POA: Diagnosis not present

## 2023-08-03 DIAGNOSIS — I444 Left anterior fascicular block: Secondary | ICD-10-CM | POA: Diagnosis not present

## 2023-08-03 DIAGNOSIS — Z639 Problem related to primary support group, unspecified: Secondary | ICD-10-CM | POA: Diagnosis not present

## 2023-08-03 DIAGNOSIS — Z87891 Personal history of nicotine dependence: Secondary | ICD-10-CM | POA: Diagnosis not present

## 2023-08-03 DIAGNOSIS — R0789 Other chest pain: Secondary | ICD-10-CM | POA: Diagnosis not present

## 2023-08-03 DIAGNOSIS — R001 Bradycardia, unspecified: Secondary | ICD-10-CM | POA: Diagnosis not present

## 2023-08-03 DIAGNOSIS — K219 Gastro-esophageal reflux disease without esophagitis: Secondary | ICD-10-CM | POA: Diagnosis not present

## 2023-08-07 ENCOUNTER — Encounter: Payer: Self-pay | Admitting: Family Medicine

## 2023-08-07 MED ORDER — NYSTATIN 100000 UNIT/GM EX POWD: 1.0000 | Freq: Three times a day (TID) | CUTANEOUS | 0 refills | Status: DC

## 2023-08-10 MED ORDER — TRIAMCINOLONE ACETONIDE 0.1 % EX CREA: 1.0000 | TOPICAL_CREAM | Freq: Two times a day (BID) | CUTANEOUS | 1 refills | Status: DC

## 2023-08-10 NOTE — Addendum Note (Signed)
 Addended by: WATT HARLENE BROCKS on: 08/10/2023 08:37 PM   Modules accepted: Orders

## 2023-08-11 DIAGNOSIS — K8689 Other specified diseases of pancreas: Secondary | ICD-10-CM | POA: Diagnosis not present

## 2023-11-05 ENCOUNTER — Encounter: Payer: Self-pay | Admitting: Podiatry

## 2023-11-05 ENCOUNTER — Ambulatory Visit: Admitting: Podiatry

## 2023-11-05 DIAGNOSIS — L03031 Cellulitis of right toe: Secondary | ICD-10-CM

## 2023-11-05 NOTE — Progress Notes (Signed)
 Subjective:  Patient ID: Tracy Larsen, female    DOB: 1960-07-27,   MRN: [id number]  Chief Complaint  Patient presents with   Nail Problem    I think this nail needs to be taken off.  It's been injured several times.  It's black, thick and pain.  I been soaking it in Peroxide.  It wakes me up at night, just hurting.  The left one has been injured as well but it is not as bad.  The right one has lifted.  I had Sepsis years ago and I don't want this to get bad.    63 y.o. female presents for concern as above. Relates the toenail has been painful . Denies any other pedal complaints. Denies n/v/f/c.   Past Medical History:  Diagnosis Date   Allergy    Arthritis    Patient denies.    Asthma    Endometriosis    GERD (gastroesophageal reflux disease)    Herpes    HSV (herpes simplex virus) infection    IBS (irritable bowel syndrome)    Insomnia    Mitral valve prolapse    UTI (urinary tract infection)    chronic history r/t bladder reflux which was repaired,    Objective:  Physical Exam: Vascular: DP/PT pulses 2/4 bilateral. CFT <3 seconds. Normal hair growth on digits. No edema.  Skin. No lacerations or abrasions bilateral feet. Right hallux nail thickened with undelrying hemorrhage noted and tenderness to palpation. Surrounding erythema noted. Left hallux nail distal aspect discolored green and black likely from trauma  Musculoskeletal: MMT 5/5 bilateral lower extremities in DF, PF, Inversion and Eversion. Deceased ROM in DF of ankle joint.  Neurological: Sensation intact to light touch.   Assessment:   1. Paronychia of great toe of right foot      Plan:  Patient was evaluated and treated and all questions answered. Discussed ingrown toenails etiology and treatment options including procedure for removal vs conservative care.  Patient requesting removal of ingrown nail today. Procedure below.  Discussed procedure and post procedure care and patient expressed  understanding.  Will follow-up in 2 weeks for nail check or sooner if any problems arise.    Procedure:  Procedure: total Nail Avulsion of right hallux nail Surgeon: Asberry Failing, DPM  Pre-op Dx: Ingrown toenail without infection Post-op: Same  Place of Surgery: Office exam room.  Indications for surgery: Painful and ingrown toenail.    The patient is requesting removal of nail without  chemical matrixectomy. Risks and complications were discussed with the patient for which they understand and written consent was obtained. Under sterile conditions a total of 3 mL of  1% lidocaine plain was infiltrated in a hallux block fashion. Once anesthetized, the skin was prepped in sterile fashion. A tourniquet was then applied. Next the entire right hallux nail was removed without incident and area copiously irrigated. . Silvadene was applied. A dry sterile dressing was applied. After application of the dressing the tourniquet was removed and there is found to be an immediate capillary refill time to the digit. The patient tolerated the procedure well without any complications. Post procedure instructions were discussed the patient for which he verbally understood. Follow-up in two weeks for nail check or sooner if any problems are to arise. Discussed signs/symptoms of infection and directed to call the office immediately should any occur or go directly to the emergency room. In the meantime, encouraged to call the office with any questions, concerns, changes symptoms.  Asberry Failing, DPM

## 2023-11-05 NOTE — Patient Instructions (Signed)

## 2023-11-12 ENCOUNTER — Ambulatory Visit: Admitting: Podiatry

## 2023-11-12 ENCOUNTER — Encounter: Payer: Self-pay | Admitting: Podiatry

## 2023-11-12 DIAGNOSIS — L03031 Cellulitis of right toe: Secondary | ICD-10-CM

## 2023-11-12 MED ORDER — CEFDINIR 300 MG PO CAPS: 300.0000 mg | ORAL_CAPSULE | Freq: Two times a day (BID) | ORAL | 0 refills | Status: AC

## 2023-11-12 NOTE — Progress Notes (Signed)
  Subjective:  Patient ID: Tracy Larsen, female    DOB: [birth year],   MRN: [id number]  Chief Complaint  Patient presents with   Nail Problem    As of yesterday, It have started getting the feeling back in it.  It's been terrible. The epsom salt water burned it.  I thought it was starting to get infected.  It was nerve pain.  I had to take Tramodol the first two days.  It felt like the circulation was being cut off.  It's still numb in spots.      63 y.o. female presents for concern as above. Relates the toenail has been painful . Denies any other pedal complaints. Denies n/v/f/c.   Past Medical History:  Diagnosis Date   Allergy    Arthritis    Patient denies.    Asthma    Endometriosis    GERD (gastroesophageal reflux disease)    Herpes    HSV (herpes simplex virus) infection    IBS (irritable bowel syndrome)    Insomnia    Mitral valve prolapse    UTI (urinary tract infection)    chronic history r/t bladder reflux which was repaired,    Objective:  Physical Exam: Vascular: DP/PT pulses 2/4 bilateral. CFT <3 seconds. Normal hair growth on digits. No edema.  Skin. No lacerations or abrasions bilateral feet. Right hallux nail bed looking well healed. Mild erythema noted to based of nail fold. Tender to palpation.  Musculoskeletal: MMT 5/5 bilateral lower extremities in DF, PF, Inversion and Eversion. Deceased ROM in DF of ankle joint.  Neurological: Sensation intact to light touch.   Assessment:   No diagnosis found.    Plan:  Patient was evaluated and treated and all questions answered. Toe was evaluated and appears to be healing well.  May discontinue soaks and neosporin.  -Some mild redness at base so cefdinir  sent as this is only antibiotic she can tolerate  -Discussed possibility of nerve sensitization vs possible allergic reaction to lidocaine or betadine.  Patient to follow-up as needed.     Asberry Failing, DPM

## 2023-11-27 ENCOUNTER — Ambulatory Visit: Admitting: Podiatry

## 2023-12-06 NOTE — Progress Notes (Unsigned)
 [facility] [address] [place], [place] [address] [phone]  Date:  12/09/2023   Name:  Tracy Larsen   DOB:  Aug 30, 1960   MRN:  [id number]  PCP:  Watt Harlene BROCKS, MD    Chief Complaint: No chief complaint on file.   History of Present Illness:  Tracy Larsen is a 63 y.o. very pleasant female patient who presents with the following:  Patient seen today to discuss her anxiety medication and also get a flu shot I saw her most recently in May She has a history of back pain, vertigo, IBS, obesity We noted some pancreatic atrophy and fatty replacement on CT scan earlier this year, she discussed this finding with hepatobiliary and pancreatic surgery at [facility] over the summer-it looks like they offered reassurance at this time She has gynecology care Seen by Ortho in June for right hip pain -Flu shot -COVID booster- -Can give pneumococcal vaccination -Shingrix Can update routine labs today  Discussed the use of AI scribe software for clinical note transcription with the patient, who gave verbal consent to proceed.  History of Present Illness    Patient Active Problem List   Diagnosis Date Noted   Lumbar radiculopathy 12/02/2021   Vertigo 12/09/2018   Transient visual disturbance, bilateral 07/02/2015   IBS (irritable bowel syndrome) 04/22/2013   Mitral valve prolapse 09/24/2012   TIA (transient ischemic attack) 09/24/2012    Past Medical History:  Diagnosis Date   Allergy    Arthritis    Patient denies.    Asthma    Endometriosis    GERD (gastroesophageal reflux disease)    Herpes    HSV (herpes simplex virus) infection    IBS (irritable bowel syndrome)    Insomnia    Mitral valve prolapse    UTI (urinary tract infection)    chronic history r/t bladder reflux which was repaired,    Past Surgical History:  Procedure Laterality Date   BLADDER REPAIR     AGE 30   NASAL SINUS  SURGERY     PELVIC LAPAROSCOPY     X 5   URETHRAL STRICTURE DILATATION      Social History   Tobacco Use   Smoking status: Former    Current packs/day: 0.00    Average packs/day: 0.3 packs/day for 20.0 years (5.0 ttl pk-yrs)    Types: Cigarettes    Start date: 01/27/1981    Quit date: 01/27/2001    Years since quitting: 22.8   Smokeless tobacco: Never  Vaping Use   Vaping status: Never Used  Substance Use Topics   Alcohol use: Yes    Alcohol/week: 1.0 standard drink of alcohol    Types: 1 Glasses of wine per week    Comment: occasionally   Drug use: No    Family History  Problem Relation Age of Onset   Cancer Mother        VULVAR CANCER   Heart disease Father        MI in his 30s   Cancer Brother        SARCOMA   Heart disease Brother        WPW   Diabetes Paternal Grandmother    Colon cancer Neg Hx     Allergies  Allergen Reactions   Alfuzosin Hcl Er Other (See Comments)    And changes in heart rate   Azithromycin  Other (See Comments)    GI  Issues/IBS Exacerbation   Breo Ellipta  [Fluticasone  Furoate-Vilanterol] Hives   Effexor [Venlafaxine]    Entex Other (See Comments)    Tongue peeling    Erythromycin Diarrhea   Promethazine Other (See Comments)    Made butt feel like it was on fire -- Suppository     Reglan  [Metoclopramide ] Other (See Comments)    Agitation    Singulair [Montelukast] Hives   Stadol [Butorphanol] Other (See Comments)    dizziness   Tamiflu [Oseltamivir Phosphate] Nausea And Vomiting   Zofran  [Ondansetron  Hcl] Nausea Only and Other (See Comments)    Pt notes she can take IV no problem,  by mouth once caused nausea    Augmentin [Amoxicillin -Pot Clavulanate] Other (See Comments)    GI issues/IBS Exacerbation    Ciprofloxacin  Other (See Comments)    GI issues/IBS Exacerbation   Levofloxacin Other (See Comments)    GI issues/IBS Exacerbation   Moxifloxacin Hcl In Nacl Other (See Comments)    GI issues/IBS Exacerbation   Sulfa  Antibiotics Rash   Tetracyclines & Related Rash    Medication list has been reviewed and updated.  Current Outpatient Medications on File Prior to Visit  Medication Sig Dispense Refill   famotidine (PEPCID) 20 MG tablet Take 20 mg by mouth daily.     methylPREDNISolone  (MEDROL ) 4 MG TBPK tablet Take as directed on pack over 6 days. 6 pills on day 1, 5 pills on day 2, 4 pills on day 3, 3 pills on day 4, 2 pills on day 5, and 1 pill on day 6. (Patient not taking: Reported on 11/12/2023) 21 tablet 0   metoCLOPramide  (REGLAN ) 10 MG tablet Take 1 tablet (10 mg total) by mouth every 6 (six) hours as needed for nausea. 30 tablet 0   nystatin  (MYCOSTATIN /NYSTOP ) powder Apply 1 Application topically 3 (three) times daily. (Patient not taking: Reported on 11/12/2023) 15 g 0   omeprazole  (PRILOSEC) 40 MG capsule Take 1 capsule (40 mg total) by mouth daily. (Patient taking differently: Take 40 mg by mouth daily as needed.) 90 capsule 2   omeprazole  (PRILOSEC) 40 MG capsule Take 1 capsule (40 mg total) by mouth daily. 90 capsule 3   triamcinolone  cream (KENALOG ) 0.1 % Apply 1 Application topically 2 (two) times daily. Use as needed for rash (Patient taking differently: Apply 1 Application topically 2 (two) times daily as needed. Use as needed for rash) 30 g 1   No current facility-administered medications on file prior to visit.    Review of Systems:  As per HPI- otherwise negative.   Physical Examination: There were no vitals filed for this visit. There were no vitals filed for this visit. There is no height or weight on file to calculate BMI. Ideal Body Weight:    GEN: no acute distress. HEENT: Atraumatic, Normocephalic.  Ears and Nose: No external deformity. CV: RRR, No M/G/R. No JVD. No thrill. No extra heart sounds. PULM: CTA B, no wheezes, crackles, rhonchi. No retractions. No resp. distress. No accessory muscle use. ABD: S, NT, ND, +BS. No rebound. No HSM. EXTR: No c/c/e PSYCH:  Normally interactive. Conversant.    Assessment and Plan: No diagnosis found.  Assessment & Plan   Signed Harlene Schroeder, MD

## 2023-12-06 NOTE — Patient Instructions (Incomplete)
Good to see you today, I will be in touch with your labs ?

## 2023-12-09 ENCOUNTER — Encounter: Payer: Self-pay | Admitting: Family Medicine

## 2023-12-09 ENCOUNTER — Ambulatory Visit

## 2023-12-09 ENCOUNTER — Ambulatory Visit
Admission: RE | Admit: 2023-12-09 | Discharge: 2023-12-09 | Disposition: A | Discharge status: Home / Self Care | Source: Ambulatory Visit | Attending: Family Medicine | Admitting: Family Medicine

## 2023-12-09 ENCOUNTER — Ambulatory Visit: Admitting: Family Medicine

## 2023-12-09 VITALS — BP 122/78 | HR 63 | Ht 66.0 in | Wt 211.0 lb

## 2023-12-09 DIAGNOSIS — R55 Syncope and collapse: Secondary | ICD-10-CM

## 2023-12-09 DIAGNOSIS — Z1329 Encounter for screening for other suspected endocrine disorder: Secondary | ICD-10-CM | POA: Diagnosis not present

## 2023-12-09 DIAGNOSIS — Z13220 Encounter for screening for lipoid disorders: Secondary | ICD-10-CM | POA: Diagnosis not present

## 2023-12-09 DIAGNOSIS — Z23 Encounter for immunization: Secondary | ICD-10-CM

## 2023-12-09 DIAGNOSIS — F411 Generalized anxiety disorder: Secondary | ICD-10-CM

## 2023-12-09 DIAGNOSIS — E538 Deficiency of other specified B group vitamins: Secondary | ICD-10-CM | POA: Diagnosis not present

## 2023-12-09 DIAGNOSIS — R42 Dizziness and giddiness: Secondary | ICD-10-CM

## 2023-12-09 DIAGNOSIS — Z131 Encounter for screening for diabetes mellitus: Secondary | ICD-10-CM | POA: Diagnosis not present

## 2023-12-09 DIAGNOSIS — Z130 Encounter for screening for diseases of the blood and blood-forming organs and certain disorders involving the immune mechanism: Secondary | ICD-10-CM

## 2023-12-09 DIAGNOSIS — E559 Vitamin D deficiency, unspecified: Secondary | ICD-10-CM

## 2023-12-09 MED ORDER — FLUOXETINE HCL 20 MG PO CAPS: 20.0000 mg | ORAL_CAPSULE | Freq: Every day | ORAL | 3 refills | Status: AC

## 2023-12-09 MED ORDER — ALPRAZOLAM 0.25 MG PO TABS: 0.2500 mg | ORAL_TABLET | Freq: Two times a day (BID) | ORAL | 0 refills | Status: DC | PRN

## 2023-12-09 NOTE — Progress Notes (Unsigned)
 EP to read.

## 2023-12-10 ENCOUNTER — Encounter: Payer: Self-pay | Admitting: Family Medicine

## 2023-12-10 LAB — COMPREHENSIVE METABOLIC PANEL WITH GFR
ALT: 9 U/L (ref 0–35)
AST: 13 U/L (ref 0–37)
Albumin: 4.2 g/dL (ref 3.5–5.2)
Alkaline Phosphatase: 103 U/L (ref 39–117)
BUN: 13 mg/dL (ref 6–23)
CO2: 28 meq/L (ref 19–32)
Calcium: 8.9 mg/dL (ref 8.4–10.5)
Chloride: 103 meq/L (ref 96–112)
Creatinine, Ser: 0.87 mg/dL (ref 0.40–1.20)
GFR: 71.6 mL/min (ref >60.00)
Glucose, Bld: 92 mg/dL (ref 70–99)
Potassium: 4.1 meq/L (ref 3.5–5.1)
Sodium: 140 meq/L (ref 135–145)
Total Bilirubin: 0.3 mg/dL (ref 0.2–1.2)
Total Protein: 6.9 g/dL (ref 6.0–8.3)

## 2023-12-10 LAB — CBC
HCT: 39.6 % (ref 36.0–46.0)
Hemoglobin: 13.4 g/dL (ref 12.0–15.0)
MCHC: 33.8 g/dL (ref 30.0–36.0)
MCV: 88.4 fl (ref 78.0–100.0)
Platelets: 286 K/uL (ref 150.0–400.0)
RBC: 4.48 Mil/uL (ref 3.87–5.11)
RDW: 12.6 % (ref 11.5–15.5)
WBC: 6.7 K/uL (ref 4.0–10.5)

## 2023-12-10 LAB — LIPID PANEL
Cholesterol: 185 mg/dL (ref 0–200)
HDL: 63.7 mg/dL (ref >39.00)
LDL Cholesterol: 98 mg/dL (ref 0–99)
NonHDL: 121.28
Total CHOL/HDL Ratio: 3
Triglycerides: 118 mg/dL (ref 0.0–149.0)
VLDL: 23.6 mg/dL (ref 0.0–40.0)

## 2023-12-10 LAB — TSH: TSH: 1.96 u[IU]/mL (ref 0.35–5.50)

## 2023-12-10 LAB — HEMOGLOBIN A1C: Hgb A1c MFr Bld: 6 % (ref 4.6–6.5)

## 2023-12-10 LAB — VITAMIN D 25 HYDROXY (VIT D DEFICIENCY, FRACTURES): VITD: 27.14 ng/mL — ABNORMAL LOW (ref 30.00–100.00)

## 2023-12-10 LAB — VITAMIN B12: Vitamin B-12: 395 pg/mL (ref 211–911)

## 2023-12-17 ENCOUNTER — Encounter: Payer: Self-pay | Admitting: Family Medicine

## 2023-12-17 MED ORDER — NYSTATIN 100000 UNIT/GM EX POWD: 1.0000 | Freq: Three times a day (TID) | CUTANEOUS | 1 refills | Status: AC

## 2023-12-23 DIAGNOSIS — R55 Syncope and collapse: Secondary | ICD-10-CM | POA: Diagnosis not present

## 2023-12-23 DIAGNOSIS — R42 Dizziness and giddiness: Secondary | ICD-10-CM | POA: Diagnosis not present

## 2023-12-24 DIAGNOSIS — R55 Syncope and collapse: Secondary | ICD-10-CM

## 2023-12-24 DIAGNOSIS — R42 Dizziness and giddiness: Secondary | ICD-10-CM

## 2023-12-28 ENCOUNTER — Encounter: Payer: Self-pay | Admitting: Family Medicine

## 2023-12-28 DIAGNOSIS — F411 Generalized anxiety disorder: Secondary | ICD-10-CM

## 2023-12-29 MED ORDER — ALPRAZOLAM 0.25 MG PO TABS: 0.2500 mg | ORAL_TABLET | Freq: Two times a day (BID) | ORAL | 0 refills | Status: AC | PRN

## 2023-12-30 ENCOUNTER — Encounter (HOSPITAL_COMMUNITY): Payer: Self-pay

## 2023-12-30 ENCOUNTER — Other Ambulatory Visit: Payer: Self-pay

## 2023-12-30 ENCOUNTER — Telehealth: Payer: Self-pay

## 2023-12-30 ENCOUNTER — Emergency Department (HOSPITAL_COMMUNITY)
Admission: EM | Admit: 2023-12-30 | Discharge: 2023-12-30 | Disposition: A | Discharge status: Home / Self Care | Attending: Emergency Medicine | Admitting: Emergency Medicine

## 2023-12-30 DIAGNOSIS — I10 Essential (primary) hypertension: Secondary | ICD-10-CM | POA: Diagnosis not present

## 2023-12-30 DIAGNOSIS — B349 Viral infection, unspecified: Secondary | ICD-10-CM | POA: Diagnosis not present

## 2023-12-30 DIAGNOSIS — Z87891 Personal history of nicotine dependence: Secondary | ICD-10-CM | POA: Diagnosis not present

## 2023-12-30 DIAGNOSIS — I213 ST elevation (STEMI) myocardial infarction of unspecified site: Secondary | ICD-10-CM | POA: Diagnosis not present

## 2023-12-30 DIAGNOSIS — G4489 Other headache syndrome: Secondary | ICD-10-CM | POA: Diagnosis not present

## 2023-12-30 DIAGNOSIS — R001 Bradycardia, unspecified: Secondary | ICD-10-CM | POA: Diagnosis not present

## 2023-12-30 DIAGNOSIS — J45909 Unspecified asthma, uncomplicated: Secondary | ICD-10-CM | POA: Diagnosis not present

## 2023-12-30 DIAGNOSIS — R42 Dizziness and giddiness: Secondary | ICD-10-CM | POA: Diagnosis not present

## 2023-12-30 LAB — RESP PANEL BY RT-PCR (RSV, FLU A&B, COVID)  RVPGX2
Influenza A by PCR: NEGATIVE
Influenza B by PCR: NEGATIVE
Resp Syncytial Virus by PCR: NEGATIVE
SARS Coronavirus 2 by RT PCR: NEGATIVE

## 2023-12-30 LAB — COMPREHENSIVE METABOLIC PANEL WITH GFR
ALT: 15 U/L (ref 0–44)
AST: 23 U/L (ref 15–41)
Albumin: 4 g/dL (ref 3.5–5.0)
Alkaline Phosphatase: 104 U/L (ref 38–126)
Anion gap: 8 (ref 5–15)
BUN: 12 mg/dL (ref 8–23)
CO2: 26 mmol/L (ref 22–32)
Calcium: 9.4 mg/dL (ref 8.9–10.3)
Chloride: 105 mmol/L (ref 98–111)
Creatinine, Ser: 0.87 mg/dL (ref 0.44–1.00)
GFR, Estimated: >60 mL/min (ref >60)
Glucose, Bld: 95 mg/dL (ref 70–99)
Potassium: 4.2 mmol/L (ref 3.5–5.1)
Sodium: 140 mmol/L (ref 135–145)
Total Bilirubin: 0.3 mg/dL (ref 0.0–1.2)
Total Protein: 7 g/dL (ref 6.5–8.1)

## 2023-12-30 LAB — CBC WITH DIFFERENTIAL/PLATELET
Abs Immature Granulocytes: 0.01 K/uL (ref 0.00–0.07)
Basophils Absolute: 0 K/uL (ref 0.0–0.1)
Basophils Relative: 1 %
Eosinophils Absolute: 0.1 K/uL (ref 0.0–0.5)
Eosinophils Relative: 2 %
HCT: 40.7 % (ref 36.0–46.0)
Hemoglobin: 13.1 g/dL (ref 12.0–15.0)
Immature Granulocytes: 0 %
Lymphocytes Relative: 34 %
Lymphs Abs: 1.9 K/uL (ref 0.7–4.0)
MCH: 29.5 pg (ref 26.0–34.0)
MCHC: 32.2 g/dL (ref 30.0–36.0)
MCV: 91.7 fL (ref 80.0–100.0)
Monocytes Absolute: 0.4 K/uL (ref 0.1–1.0)
Monocytes Relative: 7 %
Neutro Abs: 3.1 K/uL (ref 1.7–7.7)
Neutrophils Relative %: 56 %
Platelets: 242 K/uL (ref 150–400)
RBC: 4.44 MIL/uL (ref 3.87–5.11)
RDW: 12.1 % (ref 11.5–15.5)
WBC: 5.6 K/uL (ref 4.0–10.5)
nRBC: 0 % (ref 0.0–0.2)

## 2023-12-30 LAB — TSH: TSH: 3.05 u[IU]/mL (ref 0.350–4.500)

## 2023-12-30 MED ORDER — LACTATED RINGERS IV BOLUS
1000.0000 mL | Freq: Once | INTRAVENOUS | Status: AC
  Administered 2023-12-30: 1000 mL via INTRAVENOUS

## 2023-12-30 MED ORDER — IBUPROFEN 200 MG PO TABS: 400.0000 mg | ORAL_TABLET | Freq: Once | ORAL | Status: DC

## 2023-12-30 MED ORDER — ACETAMINOPHEN 500 MG PO TABS
1000.0000 mg | ORAL_TABLET | Freq: Once | ORAL | Status: AC
  Administered 2023-12-30: 1000 mg via ORAL
  Filled 2023-12-30: qty 2

## 2023-12-30 NOTE — Discharge Instructions (Signed)
 While you were in the emergency room, your blood work that was done is overall was normal.  Like we discussed, I think you likely have a virus.  It is you may take Tylenol , drink plenty of water.  Symptoms should begin to improve over the next few days.  Follow-up with your primary care doctor.

## 2023-12-30 NOTE — ED Triage Notes (Signed)
 Pt bib EMS from home. Flu like symptoms since Sunday. HR 41. Pt stated HR just dropped today. Chills, sore throat, cough, nausea, light headed.

## 2023-12-30 NOTE — ED Notes (Signed)
 Urine sent to lab

## 2023-12-30 NOTE — ED Provider Notes (Signed)
 [place] [facility] DEPARTMENT AT [facility] Provider Note  CSN: [id number] Arrival date & time: 12/30/23 9178  Chief Complaint(s) Dizziness (Pt c/o dizzyness, head ache since coming back from the beach on Monday)  HPI Tracy Larsen is a 63 y.o. female who is here today for feeling unwell.  Today she started to feel clammy, sweaty, and noticed that her heart rate was low.  Typically her heart rate is in the 50s.  She reports that shortly after Thanksgiving she began to have a sore throat, sneezing, feel fatigued.  She denies nausea.  She does state that she has been prescribed Xanax  for sleep, has been taking it intermittently over the last 1 month but has not had any in 4 days.  She denies any abdominal pain, dysuria or flank pain.   Past Medical History Past Medical History:  Diagnosis Date   Allergy    Arthritis    Patient denies.    Asthma    Endometriosis    GERD (gastroesophageal reflux disease)    Herpes    HSV (herpes simplex virus) infection    IBS (irritable bowel syndrome)    Insomnia    Mitral valve prolapse    UTI (urinary tract infection)    chronic history r/t bladder reflux which was repaired,   Patient Active Problem List   Diagnosis Date Noted   Lumbar radiculopathy 12/02/2021   Vertigo 12/09/2018   Transient visual disturbance, bilateral 07/02/2015   IBS (irritable bowel syndrome) 04/22/2013   Mitral valve prolapse 09/24/2012   TIA (transient ischemic attack) 09/24/2012   Home Medication(s) Prior to Admission medications   Medication Sig Start Date End Date Taking? Authorizing Provider  ALPRAZolam  (XANAX ) 0.25 MG tablet Take 1 tablet (0.25 mg total) by mouth 2 (two) times daily as needed for anxiety. Can use for panic attack or insomnia as needed 12/29/23   Copland, Jessica C, MD  famotidine (PEPCID) 20 MG tablet Take 20 mg by mouth daily.    [provider]  FLUoxetine  (PROZAC ) 20 MG capsule Take 1 capsule (20 mg total) by mouth  daily. 12/09/23   Copland, Harlene BROCKS, MD  nystatin  (MYCOSTATIN /NYSTOP ) powder Apply 1 Application topically 3 (three) times daily. 12/17/23   Copland, Harlene BROCKS, MD  omeprazole  (PRILOSEC) 40 MG capsule Take 1 capsule (40 mg total) by mouth daily. 06/15/23   Copland, Harlene BROCKS, MD                                                                                                                                    Past Surgical History Past Surgical History:  Procedure Laterality Date   BLADDER REPAIR     AGE 72   NASAL SINUS SURGERY     PELVIC LAPAROSCOPY     X 5   URETHRAL STRICTURE DILATATION     Family History Family History  Problem Relation Age of Onset   Cancer  Mother        VULVAR CANCER   Heart disease Father        MI in his 51s   Cancer Brother        SARCOMA   Heart disease Brother        WPW   Diabetes Paternal Grandmother    Colon cancer Neg Hx     Social History Social History   Tobacco Use   Smoking status: Former    Current packs/day: 0.00    Average packs/day: 0.3 packs/day for 20.0 years (5.0 ttl pk-yrs)    Types: Cigarettes    Start date: 01/27/1981    Quit date: 01/27/2001    Years since quitting: 22.9   Smokeless tobacco: Never  Vaping Use   Vaping status: Never Used  Substance Use Topics   Alcohol use: Yes    Alcohol/week: 1.0 standard drink of alcohol    Types: 1 Glasses of wine per week    Comment: occasionally   Drug use: No   Allergies Alfuzosin hcl er, Azithromycin , Breo ellipta  [fluticasone  furoate-vilanterol], Effexor [venlafaxine], Entex, Erythromycin, Promethazine, Reglan  [metoclopramide ], Singulair [montelukast], Stadol [butorphanol], Tamiflu [oseltamivir phosphate], Zofran  [ondansetron  hcl], Augmentin [amoxicillin -pot clavulanate], Ciprofloxacin , Levofloxacin, Moxifloxacin hcl in nacl, Sulfa antibiotics, and Tetracyclines & related  Review of Systems Review of Systems  Physical Exam Vital Signs  I have reviewed the triage vital signs BP  (!) 151/63   Pulse (!) 42   Temp 98.2 F (36.8 C) (Oral)   Resp 15   Ht 5' 6 (1.676 m)   Wt 95.7 kg   LMP 09/27/2011   SpO2 98%   BMI 34.06 kg/m   Physical Exam Vitals and nursing note reviewed.  HENT:     Head: Normocephalic and atraumatic.     Mouth/Throat:     Pharynx: Posterior oropharyngeal erythema present.     Comments: Uvula midline, no mass, no exudates Eyes:     Pupils: Pupils are equal, round, and reactive to light.  Cardiovascular:     Rate and Rhythm: Bradycardia present.     Heart sounds: No murmur heard. Pulmonary:     Effort: Pulmonary effort is normal.     Breath sounds: Normal breath sounds. No wheezing or rhonchi.  Abdominal:     General: Abdomen is flat.     Palpations: Abdomen is soft.  Musculoskeletal:        General: Normal range of motion.  Neurological:     General: No focal deficit present.     Mental Status: She is alert.     ED Results and Treatments Labs (all labs ordered are listed, but only abnormal results are displayed) Labs Reviewed  RESP PANEL BY RT-PCR (RSV, FLU A&B, COVID)  RVPGX2  CBC WITH DIFFERENTIAL/PLATELET  COMPREHENSIVE METABOLIC PANEL WITH GFR  TSH  Radiology No results found.  Pertinent labs & imaging results that were available during my care of the patient were reviewed by me and considered in my medical decision making (see MDM for details).  Medications Ordered in ED Medications  ibuprofen (ADVIL) tablet 400 mg (400 mg Oral Patient Refused/Not Given 12/30/23 0940)  lactated ringers  bolus 1,000 mL (1,000 mLs Intravenous New Bag/Given 12/30/23 1010)  acetaminophen  (TYLENOL ) tablet 1,000 mg (1,000 mg Oral Given 12/30/23 1010)  lactated ringers  bolus 1,000 mL (1,000 mLs Intravenous New Bag/Given 12/30/23 9077)                                                                                                                                      Procedures Procedures  (including critical care time)  Medical Decision Making / ED Course   This patient presents to the ED for concern of flulike symptoms, sore throat, this involves an extensive number of treatment options, and is a complaint that carries with it a high risk of complications and morbidity.  The differential diagnosis includes viral syndrome, hypothyroidism, dehydration.  MDM: 63 year old female here today for sore throat, feeling unwell, chills, low heart rate.  Going through the patient's prior EKGs, she does periodically have low heart rates that have been recorded here.  When I am in the room, patient's heart rate remains in the 50s which appears to be normal for her.  She does appear dehydrated.  Will provide her with some fluids.  Will check a TSH.  Basic blood work ordered on the patient, otherwise suspect this is likely viral syndrome.  I do not see evidence of benzodiazepine withdrawal on my exam, she has been taking a small dose not every day over the last 1 month.  I have a lower suspicion for dependence.  Provide the patient some Tylenol , ibuprofen and reassess.  Reassessment 11:15 AM-patient feeling quite a bit better with fluid resuscitation.  Viral panel negative, no anemia, normal renal function.  States normal.  Will discharge.  Additional history obtained: -Additional history obtained from husband at bedside -External records from outside source obtained and reviewed including: Chart review including previous notes, labs, imaging, consultation notes   Lab Tests: -I ordered, reviewed, and interpreted labs.   The pertinent results include:   Labs Reviewed  RESP PANEL BY RT-PCR (RSV, FLU A&B, COVID)  RVPGX2  CBC WITH DIFFERENTIAL/PLATELET  COMPREHENSIVE METABOLIC PANEL WITH GFR  TSH      EKG/bradycardia, no evidence of acute ischemia, normal intervals.  EKG Interpretation Date/Time:  Wednesday December 30 2023 09:05:16 EST Ventricular Rate:  42 PR Interval:  114 QRS Duration:  103 QT Interval:  495 QTC Calculation: 414 R Axis:   10  Text Interpretation: Sinus bradycardia Borderline short PR interval Low voltage, precordial leads Confirmed by Mannie Pac [phone]) on 12/30/2023 10:20:48 AM          Medicines ordered and prescription  drug management: Meds ordered this encounter  Medications   lactated ringers  bolus 1,000 mL   acetaminophen  (TYLENOL ) tablet 1,000 mg   ibuprofen (ADVIL) tablet 400 mg   lactated ringers  bolus 1,000 mL    -I have reviewed the patients home medicines and have made adjustments as needed   Cardiac Monitoring: The patient was maintained on a cardiac monitor.  I personally viewed and interpreted the cardiac monitored which showed an underlying rhythm of: Normal sinus rhythm  Social Determinants of Health:  Factors impacting patients care include: Lack of access to primary care   Reevaluation: After the interventions noted above, I reevaluated the patient and found that they have :improved  Co morbidities that complicate the patient evaluation  Past Medical History:  Diagnosis Date   Allergy    Arthritis    Patient denies.    Asthma    Endometriosis    GERD (gastroesophageal reflux disease)    Herpes    HSV (herpes simplex virus) infection    IBS (irritable bowel syndrome)    Insomnia    Mitral valve prolapse    UTI (urinary tract infection)    chronic history r/t bladder reflux which was repaired,      Dispostion: I considered admission for this patient, however with reassuring workup she is appropriate for discharge.     Final Clinical Impression(s) / ED Diagnoses Final diagnoses:  Viral syndrome     @PCDICTATION @    Mannie Pac T, DO 12/30/23 1121

## 2023-12-30 NOTE — ED Notes (Signed)
 Pt recently at beach returned on Monday with chills.   No fever, but does have history of sepsis and was concerned.

## 2023-12-30 NOTE — Telephone Encounter (Signed)
 Copied from CRM #[id number]. Topic: Clinical - Medical Advice >> Dec 30, 2023 10:44 AM Frederich PARAS wrote: Reason for CRM: pt called and says she is at er at [facility], she says she talked to pcp about meds put on 1 month ago, she says she did not take 1 yesterday and pcp asked about switching to a diff med. Pt says she is interested in trying something diff with less side affects and less muscle reactions becasue it did help with hot flashes along with the anxiety  Pt callback is [phone] >> Dec 30, 2023 10:50 AM Frederich PARAS wrote: Pt also wants to add she already tried effexor and had a bad reaction

## 2023-12-31 ENCOUNTER — Ambulatory Visit: Admitting: Family Medicine

## 2024-01-05 ENCOUNTER — Ambulatory Visit
Admission: RE | Admit: 2024-01-05 | Discharge: 2024-01-05 | Disposition: A | Source: Ambulatory Visit | Attending: Obstetrics and Gynecology

## 2024-01-05 ENCOUNTER — Other Ambulatory Visit: Payer: Self-pay | Admitting: Obstetrics and Gynecology

## 2024-01-05 DIAGNOSIS — R928 Other abnormal and inconclusive findings on diagnostic imaging of breast: Secondary | ICD-10-CM

## 2024-01-05 DIAGNOSIS — R921 Mammographic calcification found on diagnostic imaging of breast: Secondary | ICD-10-CM
